# Patient Record
Sex: Male | Born: 1935 | Race: White | Hispanic: No | Smoking: Former smoker
Health system: Southern US, Community
[De-identification: ages and names within clinical notes are randomized; demographics above are authoritative.]

## PROBLEM LIST (undated history)

## (undated) DIAGNOSIS — N183 Chronic kidney disease, stage 3 unspecified: Secondary | ICD-10-CM

## (undated) DIAGNOSIS — I4891 Unspecified atrial fibrillation: Secondary | ICD-10-CM

## (undated) DIAGNOSIS — F0390 Unspecified dementia without behavioral disturbance: Secondary | ICD-10-CM

## (undated) DIAGNOSIS — M545 Low back pain, unspecified: Secondary | ICD-10-CM

## (undated) DIAGNOSIS — D649 Anemia, unspecified: Secondary | ICD-10-CM

## (undated) DIAGNOSIS — I509 Heart failure, unspecified: Secondary | ICD-10-CM

## (undated) DIAGNOSIS — I10 Essential (primary) hypertension: Secondary | ICD-10-CM

## (undated) DIAGNOSIS — R600 Localized edema: Secondary | ICD-10-CM

## (undated) DIAGNOSIS — C679 Malignant neoplasm of bladder, unspecified: Secondary | ICD-10-CM

## (undated) DIAGNOSIS — H3530 Unspecified macular degeneration: Secondary | ICD-10-CM

## (undated) DIAGNOSIS — I739 Peripheral vascular disease, unspecified: Secondary | ICD-10-CM

## (undated) DIAGNOSIS — G459 Transient cerebral ischemic attack, unspecified: Secondary | ICD-10-CM

## (undated) DIAGNOSIS — J189 Pneumonia, unspecified organism: Secondary | ICD-10-CM

## (undated) DIAGNOSIS — E785 Hyperlipidemia, unspecified: Secondary | ICD-10-CM

## (undated) DIAGNOSIS — R569 Unspecified convulsions: Secondary | ICD-10-CM

## (undated) DIAGNOSIS — E119 Type 2 diabetes mellitus without complications: Secondary | ICD-10-CM

## (undated) DIAGNOSIS — I2510 Atherosclerotic heart disease of native coronary artery without angina pectoris: Secondary | ICD-10-CM

## (undated) DIAGNOSIS — N390 Urinary tract infection, site not specified: Secondary | ICD-10-CM

## (undated) DIAGNOSIS — I6529 Occlusion and stenosis of unspecified carotid artery: Secondary | ICD-10-CM

## (undated) DIAGNOSIS — R4182 Altered mental status, unspecified: Secondary | ICD-10-CM

## (undated) DIAGNOSIS — M1990 Unspecified osteoarthritis, unspecified site: Secondary | ICD-10-CM

## (undated) DIAGNOSIS — I482 Chronic atrial fibrillation, unspecified: Secondary | ICD-10-CM

## (undated) HISTORY — DX: Heart failure, unspecified: I50.9

## (undated) HISTORY — DX: Transient cerebral ischemic attack, unspecified: G45.9

## (undated) HISTORY — PX: OTHER SURGICAL HISTORY: SHX169

## (undated) HISTORY — DX: Atherosclerotic heart disease of native coronary artery without angina pectoris: I25.10

## (undated) HISTORY — DX: Low back pain: M54.5

## (undated) HISTORY — PX: BLADDER TUMOR EXCISION: SHX238

## (undated) HISTORY — PX: TONSILLECTOMY: SUR1361

## (undated) HISTORY — PX: EYE SURGERY: SHX253

## (undated) HISTORY — DX: Hyperlipidemia, unspecified: E78.5

## (undated) HISTORY — DX: Unspecified macular degeneration: H35.30

## (undated) HISTORY — DX: Peripheral vascular disease, unspecified: I73.9

## (undated) HISTORY — DX: Localized edema: R60.0

## (undated) HISTORY — PX: ORBITAL FRACTURE SURGERY: SHX725

## (undated) HISTORY — DX: Anemia, unspecified: D64.9

## (undated) HISTORY — DX: Essential (primary) hypertension: I10

## (undated) HISTORY — DX: Unspecified atrial fibrillation: I48.91

## (undated) HISTORY — DX: Urinary tract infection, site not specified: N39.0

## (undated) HISTORY — DX: Occlusion and stenosis of unspecified carotid artery: I65.29

## (undated) HISTORY — DX: Chronic atrial fibrillation, unspecified: I48.20

## (undated) HISTORY — DX: Low back pain, unspecified: M54.50

---
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
## 1988-12-10 HISTORY — PX: I & D EXTREMITY: SHX5045

## 1997-07-21 ENCOUNTER — Other Ambulatory Visit: Admission: RE | Admit: 1997-07-21 | Discharge: 1997-07-21 | Payer: Self-pay | Admitting: Orthopedic Surgery

## 1997-08-28 ENCOUNTER — Encounter: Admission: RE | Admit: 1997-08-28 | Discharge: 1997-08-28 | Payer: Self-pay | Admitting: Infectious Diseases

## 1998-04-01 ENCOUNTER — Ambulatory Visit (HOSPITAL_BASED_OUTPATIENT_CLINIC_OR_DEPARTMENT_OTHER): Admission: RE | Admit: 1998-04-01 | Discharge: 1998-04-01 | Payer: Self-pay | Admitting: Urology

## 1999-04-12 DIAGNOSIS — C679 Malignant neoplasm of bladder, unspecified: Secondary | ICD-10-CM

## 1999-04-12 HISTORY — DX: Malignant neoplasm of bladder, unspecified: C67.9

## 2000-01-24 ENCOUNTER — Encounter: Payer: Self-pay | Admitting: Urology

## 2000-01-24 ENCOUNTER — Ambulatory Visit (HOSPITAL_COMMUNITY): Admission: RE | Admit: 2000-01-24 | Discharge: 2000-01-24 | Payer: Self-pay | Admitting: Urology

## 2000-01-24 ENCOUNTER — Encounter (INDEPENDENT_AMBULATORY_CARE_PROVIDER_SITE_OTHER): Payer: Self-pay

## 2000-05-02 ENCOUNTER — Encounter: Admission: RE | Admit: 2000-05-02 | Discharge: 2000-07-31 | Payer: Self-pay | Admitting: Internal Medicine

## 2001-09-27 ENCOUNTER — Ambulatory Visit (HOSPITAL_COMMUNITY): Admission: RE | Admit: 2001-09-27 | Discharge: 2001-09-27 | Payer: Self-pay | Admitting: Internal Medicine

## 2005-02-09 DIAGNOSIS — I2510 Atherosclerotic heart disease of native coronary artery without angina pectoris: Secondary | ICD-10-CM

## 2005-02-09 HISTORY — DX: Atherosclerotic heart disease of native coronary artery without angina pectoris: I25.10

## 2005-02-09 HISTORY — PX: CORONARY ANGIOPLASTY WITH STENT PLACEMENT: SHX49

## 2005-02-28 ENCOUNTER — Inpatient Hospital Stay (HOSPITAL_COMMUNITY): Admission: RE | Admit: 2005-02-28 | Discharge: 2005-03-02 | Payer: Self-pay | Admitting: Cardiology

## 2005-10-19 ENCOUNTER — Ambulatory Visit (HOSPITAL_COMMUNITY): Admission: RE | Admit: 2005-10-19 | Discharge: 2005-10-19 | Payer: Self-pay | Admitting: Urology

## 2005-10-19 ENCOUNTER — Encounter (INDEPENDENT_AMBULATORY_CARE_PROVIDER_SITE_OTHER): Payer: Self-pay | Admitting: Specialist

## 2005-10-25 ENCOUNTER — Encounter: Admission: RE | Admit: 2005-10-25 | Discharge: 2005-10-25 | Payer: Self-pay | Admitting: Internal Medicine

## 2009-02-06 ENCOUNTER — Ambulatory Visit: Payer: Self-pay | Admitting: Vascular Surgery

## 2010-04-17 ENCOUNTER — Emergency Department (HOSPITAL_COMMUNITY)
Admission: EM | Admit: 2010-04-17 | Discharge: 2010-04-17 | Payer: Self-pay | Source: Home / Self Care | Admitting: Emergency Medicine

## 2010-05-02 ENCOUNTER — Encounter: Payer: Self-pay | Admitting: Internal Medicine

## 2010-08-27 NOTE — Op Note (Signed)
[facility]  Patient:    [name], [name]                     MRN: [id number] Proc. Date: 01/24/00 Adm. Date:  [id number] Disc. 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  PROCEDURE PERFORMED:  Cystoscopy, left double-J stent placement, and transurethral resection bladder tumor.  SURGEON:  Barron Alvine, M.D.  ANESTHESIA:  General.  INDICATIONS:  Mr. Ridley is an 77 year old male.  He has had recurrent superficial and low-grade transitional cell carcinoma of the bladder.  A recent office cystoscopy as part of his routine follow-up, he was noted to have a small area of papillary transitional cell carcinoma that appeared well differentiated and surrounding the left ureteral orifice.  He presents now for management for this.  TECHNIQUE AND FINDINGS:  The patient was brought to the operating room where he had successful induction of general endotracheal anesthesia.  He was placed in lithotomy position and prepped and draped in the usual manner.  Cystoscopy revealed moderate trilobar hyperplasia.  Around the left ureteral orifice there was a carpeting of well-differentiated appearing papillary transitional cell carcinoma.  A 7 French 24 cm double-J stent was placed in the ureteral orifice to protect the orifice prior to parting on the resection.  This was done in a standard manner with fluoroscopy.  We then used a 27 [place] continuous flow resectoscope.  We were able to resect the tumor around the ureteral orifice.  We felt that the double-J stent should remain indwelling to make certain that there was no ureteral stricturing.  There was minimal bleeding.  Hemostasis was excellent.  We did not feel a  Foley catheter was necessary.  He was brought to the recovery room in stable condition. DD:  02/14/00 TD:  02/14/00 Job: [id number]

## 2010-08-27 NOTE — Op Note (Signed)
NAME:  PHONG, ISENBERG NO.:  [id number]   MEDICAL RECORD NO.:  [id number]          PATIENT TYPE:  AMB   LOCATION:  DAY                          FACILITY:  [facility]   PHYSICIAN:  Valetta Fuller, M.D.  DATE OF BIRTH:  [birth year]   DATE OF PROCEDURE:  10/19/2005  DATE OF DISCHARGE:                                 OPERATIVE REPORT   PREOPERATIVE DIAGNOSIS:  Recurrent transitional cell carcinoma of the  bladder.   POSTOPERATIVE DIAGNOSIS:  Recurrent transitional cell carcinoma of the  bladder.   PROCEDURES PERFORMED:  1.  Cystoscopy with bladder biopsy x3.  2.  Fulguration of remaining tumor.   SURGEON:  Valetta Fuller, M.D.   ANESTHESIA:  General.   INDICATIONS:  Mr. Franklin is an 77 year old male who has a previous history  of transitional cell carcinoma of the bladder.  He recently presented for  followup after not being seen in the office for about 2 to 2-1/2 years.  About 10 years ago he was diagnosed with his first transitional cell  carcinoma of the bladder.  His tumors had been noninvasive and low grade.  Approximately 2 years after his initial diagnosis he had a recurrence; and  then another one about 2 years after that.  All of his tumors have been low  grade and noninvasive.  The patient had negative followup then for several  years.  He came in recently for repeat followup and cystoscopy revealed  multiple areas of tumor involving primarily the left hemitrigone and bladder  neck region.  All these tumors appeared to be well-differentiated and  noninvasive.  We recommended repeat biopsy and fulguration.  The patient did  receive some preoperative clearance.  We elected to stop his Plavix, but  continue his aspirin.  He did receive preoperative clearance by his  cardiologist.   TECHNIQUE AND FINDINGS:  The patient was brought to the operating room where  he had successful induction of general anesthesia.  He was placed in the  lithotomy position and  prepped and draped in the usual manner.  The patient  does have significant phimosis, but we were able to insert the cystoscope  without difficulty.  He does have a little trilobar hyperplasia, but his  bladder neck is wide open.  The bladder was carefully inspected.  The  majority of the tumor was really involving the left hemi trigone, but was  away from the left ureteral orifice.  There was also some carpeting of the  tumor onto the left bladder neck region.   One area of tumor was fairly exophytic measuring just little a less than 1  centimeter in size.  That larger little tumor was cold-cup biopsied.  Additional areas of carpeting were also biopsied to be sure there was really  no carcinoma in situ.  Once the biopsies were complete, we used the Bovie  electrode to cauterize the areas of biopsy; and then to treat the overlying  little bit of carpeting of transitional cell carcinoma that was involving  the left hemitrigone and bladder neck region.  There were no other areas  involved.  While some of the areas of the tumor came close to the  ureteral orifice, they did not really involve that; and, therefore, stenting  was not required.  The patient appeared to tolerate the procedure well.  The  bladder was copiously irrigated at the end of the procedure; and the urine  was clear.  Foley catheter drainage was not felt to be necessary.  He was  brought to recovery room in stable condition.           ______________________________  Valetta Fuller, M.D.  Electronically Signed     DSG/MEDQ  D:  10/19/2005  T:  10/19/2005  Job:  [id number]

## 2010-08-27 NOTE — Discharge Summary (Signed)
NAME:  MODESTO, GANOE NO.:  [id number]   MEDICAL RECORD NO.:  [id number]          PATIENT TYPE:  INP   LOCATION:  6533                         FACILITY:  [facility]   PHYSICIAN:  Francisca December, M.D.  DATE OF BIRTH:  1917-11-25   DATE OF ADMISSION:  02/28/2005  DATE OF DISCHARGE:  03/02/2005                                 DISCHARGE SUMMARY   PRIMARY CARE PHYSICIAN:  Thora Lance, M.D.   CHIEF COMPLAINT AND REASON FOR ADMISSION:  Mr. Amory is an 77 year old  gentleman with known diabetes and hypertension who had previously been  evaluated by Dr. Amil Amen in June 2006 for complaints of episodic chest  pressure. Cardiolite study showed abnormalities.  At that time, cardiac  catheterization for diagnostic purposes was recommended by Dr. Amil Amen, but  Mr. Perreira was reluctant to proceed.  Since that time, the patient has  resumed having chest discomfort.  This started again about 2-1/2 weeks prior  to admission described as a dull ache, intermittent in frequency, rated  3/10. There is no radiation of this pain.  The pain lasts for about 15  minutes and is relieved by nitroglycerin.  The patient has believed that  this chest pressure is stress induced, no other associated symptoms.  The  patient did follow up with Dr. Amil Amen in the office on February 14, 2005,  and at this time he agrees to undergo diagnostic coronary angiography to  further clarify etiology of chest discomfort.   ADMISSION DIAGNOSES:  1.  Abnormal Cardiolite study with recurrent exertional chest discomfort.  2.  Hypertension.  3.  Diabetes.  4.  History of bladder tumor.  5.  History of abnormal Cardiolite study in June 2006 that demonstrated a      reversible apical lateral and apical defect, probable left anterior      descending obstruction with preserved left ventricular function,      ejection fraction 51%.  6.  Memory loss.  7.  Vitamin B12 deficiency.   HOSPITAL COURSE:  #1.  ABNORMAL  CARDIOLITE STUDY IN PATIENT WITH EXERTIONAL CHEST DISCOMFORT:  The patient was admitted via short stay for diagnostic coronary angiography,  and he underwent this on February 28, 2005.  He was found to have two-vessel  coronary artery disease involving the RCA and LAD.  On November 20, he  underwent percutaneous coronary intervention and Taxus stent implantation to  the RCA and tolerated the procedure well with plans to return on November 21  for percutaneous coronary intervention of the LAD.   On March 01, 2005, the patient's left groin was unremarkable.  Physical  exam was stable with moderately elevated blood pressure at 155/55, pulse  anywhere from 54 to 65.  Creatinine stable at 1.3.   He subsequently underwent percutaneous coronary intervention and stent  implantation to the LAD and diagonal on March 01, 2005.  Again, this was  a Taxus stent.  He was started on Aggrastat during the procedure and  continued postprocedure.  His right groin was unremarkable with a few  bruises, no hematoma.  Distal pulses were intact.  His  blood pressure was  still somewhat elevated at 152/50 with a heart rate of 68, so low-dose  metoprolol 25 mg twice daily has been initiated.   Postprocedure March 02, 2005, potassium 3.6, creatinine 1.2.  Lipid  status is unknown, and I will discuss with Dr. Amil Amen.  The patient will  probably have a fasting lipid panel drawn after discharge, and Dr. Amil Amen  will determine later if patient needs to initiate statin therapy in the  setting of 2-vessel coronary artery disease status post percutaneous  coronary intervention and stent implantation.   DISCHARGE DIAGNOSES:  1.  Exertional chest discomfort with abnormal Cardiolite study.  2.  Status post percutaneous coronary intervention to the right coronary      artery with Taxus stent implantation on February 28, 2005.  3.  Status post percutaneous coronary intervention with Taxus stent      implantation  to the left anterior descending and diagonal on March 01, 2005.  4.  Preserved left ventricular systolic function with ejection fraction 65%      per catheterization.  5.  Hypertension, currently uncontrolled.  6.  Diabetes mellitus.  7.  Memory loss.  8.  B12 deficiency.   DISCHARGE MEDICATIONS:  1.  Amaryl, dosage unclear, it is either 2 or 4 mg daily.  Reviewed the      medication reconciliation sheet, and there were 2 different dosages in      Amaryl as patient was taking it at home.  2.  Continue vitamin B12, multivitamins, vitamin E daily.  3.  Continue Maxzide 37.5/25 mg daily.  4.  Aspirin 325 mg daily.  5.  Nitroglycerin 0.4 mg as needed for chest pain.  6.  Plavix 75 mg daily.  This is new.  7.  Metoprolol 25 mg twice daily.  This is new.   DIET:  Heart healthy.   ACTIVITY:  Increase activity slowly.  May shower or bathe.  No driving for  48 hours.  No lifting for 48 hours over 10 pounds.   WOUND CARE:  Call for any increased bruising or swelling at groin site.   FAMILY HISTORY:  Dr. Amil Amen on December 7 at 9 a.m. with additional  recommends by Dr. Amil Amen.      Allison L. Rolene Course      Francisca December, M.D.  Electronically Signed   ALE/MEDQ  D:  03/02/2005  T:  03/02/2005  Job:  [id number]   cc:   Thora Lance, M.D.  Fax: [phone]

## 2010-12-30 ENCOUNTER — Encounter: Payer: Self-pay | Admitting: Vascular Surgery

## 2010-12-30 ENCOUNTER — Other Ambulatory Visit: Payer: Self-pay | Admitting: Internal Medicine

## 2010-12-30 ENCOUNTER — Ambulatory Visit
Admission: RE | Admit: 2010-12-30 | Discharge: 2010-12-30 | Disposition: A | Discharge status: Home / Self Care | Payer: Medicare HMO | Source: Ambulatory Visit | Attending: Internal Medicine | Admitting: Internal Medicine

## 2010-12-30 DIAGNOSIS — G459 Transient cerebral ischemic attack, unspecified: Secondary | ICD-10-CM

## 2010-12-31 ENCOUNTER — Ambulatory Visit (INDEPENDENT_AMBULATORY_CARE_PROVIDER_SITE_OTHER): Payer: Medicare HMO | Admitting: Vascular Surgery

## 2010-12-31 ENCOUNTER — Encounter: Payer: Self-pay | Admitting: Vascular Surgery

## 2010-12-31 ENCOUNTER — Other Ambulatory Visit: Payer: Self-pay

## 2010-12-31 VITALS — BP 153/80 | HR 54 | Resp 20 | Ht 69.0 in | Wt 227.0 lb

## 2010-12-31 DIAGNOSIS — I6529 Occlusion and stenosis of unspecified carotid artery: Secondary | ICD-10-CM
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
## 2011-01-13 ENCOUNTER — Inpatient Hospital Stay (HOSPITAL_COMMUNITY)
Admission: EM | Admit: 2011-01-13 | Discharge: 2011-01-20 | DRG: 038 | Disposition: A | Discharge status: Home / Self Care | Payer: Medicare HMO | Source: Ambulatory Visit | Attending: Internal Medicine | Admitting: Internal Medicine

## 2011-01-13 ENCOUNTER — Emergency Department (HOSPITAL_COMMUNITY): Payer: Medicare HMO

## 2011-01-13 DIAGNOSIS — R339 Retention of urine, unspecified: Secondary | ICD-10-CM | POA: Diagnosis not present

## 2011-01-13 DIAGNOSIS — Z7901 Long term (current) use of anticoagulants: Secondary | ICD-10-CM

## 2011-01-13 DIAGNOSIS — D649 Anemia, unspecified: Secondary | ICD-10-CM | POA: Diagnosis present

## 2011-01-13 DIAGNOSIS — E119 Type 2 diabetes mellitus without complications: Secondary | ICD-10-CM | POA: Diagnosis present

## 2011-01-13 DIAGNOSIS — E538 Deficiency of other specified B group vitamins: Secondary | ICD-10-CM | POA: Diagnosis present

## 2011-01-13 DIAGNOSIS — Z8673 Personal history of transient ischemic attack (TIA), and cerebral infarction without residual deficits: Secondary | ICD-10-CM

## 2011-01-13 DIAGNOSIS — Z880 Allergy status to penicillin: Secondary | ICD-10-CM

## 2011-01-13 DIAGNOSIS — Z7982 Long term (current) use of aspirin: Secondary | ICD-10-CM

## 2011-01-13 DIAGNOSIS — I10 Essential (primary) hypertension: Secondary | ICD-10-CM | POA: Diagnosis present

## 2011-01-13 DIAGNOSIS — N471 Phimosis: Secondary | ICD-10-CM | POA: Diagnosis present

## 2011-01-13 DIAGNOSIS — Z9861 Coronary angioplasty status: Secondary | ICD-10-CM

## 2011-01-13 DIAGNOSIS — Z8551 Personal history of malignant neoplasm of bladder: Secondary | ICD-10-CM

## 2011-01-13 DIAGNOSIS — Z7902 Long term (current) use of antithrombotics/antiplatelets: Secondary | ICD-10-CM

## 2011-01-13 DIAGNOSIS — N478 Other disorders of prepuce: Secondary | ICD-10-CM | POA: Diagnosis present

## 2011-01-13 DIAGNOSIS — I63239 Cerebral infarction due to unspecified occlusion or stenosis of unspecified carotid arteries: Principal | ICD-10-CM | POA: Diagnosis present

## 2011-01-13 DIAGNOSIS — I4892 Unspecified atrial flutter: Secondary | ICD-10-CM | POA: Diagnosis not present

## 2011-01-13 DIAGNOSIS — I2510 Atherosclerotic heart disease of native coronary artery without angina pectoris: Secondary | ICD-10-CM | POA: Diagnosis present

## 2011-01-13 DIAGNOSIS — I4891 Unspecified atrial fibrillation: Secondary | ICD-10-CM | POA: Diagnosis present

## 2011-01-13 DIAGNOSIS — D696 Thrombocytopenia, unspecified: Secondary | ICD-10-CM | POA: Diagnosis not present

## 2011-01-13 DIAGNOSIS — Z79899 Other long term (current) drug therapy: Secondary | ICD-10-CM

## 2011-01-13 DIAGNOSIS — E785 Hyperlipidemia, unspecified: Secondary | ICD-10-CM | POA: Diagnosis present

## 2011-01-13 DIAGNOSIS — N179 Acute kidney failure, unspecified: Secondary | ICD-10-CM | POA: Diagnosis present

## 2011-01-13 LAB — BASIC METABOLIC PANEL
BUN: 35 mg/dL — ABNORMAL HIGH (ref 6–23)
CO2: 24 mEq/L (ref 19–32)
Calcium: 9.4 mg/dL (ref 8.4–10.5)
Chloride: 109 mEq/L (ref 96–112)
Creatinine, Ser: 2.03 mg/dL — ABNORMAL HIGH (ref 0.50–1.35)
GFR calc Af Amer: 31 mL/min — ABNORMAL LOW (ref >90)
GFR calc non Af Amer: 27 mL/min — ABNORMAL LOW (ref >90)
Glucose, Bld: 170 mg/dL — ABNORMAL HIGH (ref 70–99)
Potassium: 4.5 mEq/L (ref 3.5–5.1)
Sodium: 139 mEq/L (ref 135–145)

## 2011-01-13 LAB — DIFFERENTIAL
Basophils Absolute: 0.1 10*3/uL (ref 0.0–0.1)
Basophils Relative: 1 % (ref 0–1)
Eosinophils Absolute: 0.1 10*3/uL (ref 0.0–0.7)
Eosinophils Relative: 1 % (ref 0–5)
Lymphocytes Relative: 16 % (ref 12–46)
Lymphs Abs: 1.1 10*3/uL (ref 0.7–4.0)
Monocytes Absolute: 0.3 10*3/uL (ref 0.1–1.0)
Monocytes Relative: 5 % (ref 3–12)
Neutro Abs: 5.2 10*3/uL (ref 1.7–7.7)
Neutrophils Relative %: 77 % (ref 43–77)

## 2011-01-13 LAB — CBC
HCT: 30.5 % — ABNORMAL LOW (ref 39.0–52.0)
Hemoglobin: 10.2 g/dL — ABNORMAL LOW (ref 13.0–17.0)
MCH: 31.5 pg (ref 26.0–34.0)
MCHC: 33.4 g/dL (ref 30.0–36.0)
MCV: 94.1 fL (ref 78.0–100.0)
Platelets: 174 10*3/uL (ref 150–400)
RBC: 3.24 MIL/uL — ABNORMAL LOW (ref 4.22–5.81)
RDW: 16.3 % — ABNORMAL HIGH (ref 11.5–15.5)
WBC: 6.8 10*3/uL (ref 4.0–10.5)

## 2011-01-13 LAB — PROTIME-INR
INR: 1.03 (ref 0.00–1.49)
Prothrombin Time: 13.7 seconds (ref 11.6–15.2)

## 2011-01-14 ENCOUNTER — Inpatient Hospital Stay (HOSPITAL_COMMUNITY): Payer: Medicare HMO

## 2011-01-14 DIAGNOSIS — I6529 Occlusion and stenosis of unspecified carotid artery: Secondary | ICD-10-CM

## 2011-01-14 LAB — GLUCOSE, CAPILLARY
Glucose-Capillary: 136 mg/dL — ABNORMAL HIGH (ref 70–99)
Glucose-Capillary: 128 mg/dL — ABNORMAL HIGH (ref 70–99)

## 2011-01-14 LAB — COMPREHENSIVE METABOLIC PANEL
ALT: 13 U/L (ref 0–53)
AST: 22 U/L (ref 0–37)
Albumin: 2.8 g/dL — ABNORMAL LOW (ref 3.5–5.2)
Calcium: 8.8 mg/dL (ref 8.4–10.5)
Sodium: 142 mEq/L (ref 135–145)
Total Protein: 6.3 g/dL (ref 6.0–8.3)

## 2011-01-14 LAB — CARDIAC PANEL(CRET KIN+CKTOT+MB+TROPI)
CK, MB: 2.2 ng/mL (ref 0.3–4.0)
Relative Index: INVALID (ref 0.0–2.5)
Total CK: 44 U/L (ref 7–232)

## 2011-01-15 LAB — BASIC METABOLIC PANEL
GFR calc Af Amer: 50 mL/min — ABNORMAL LOW (ref >90)
GFR calc non Af Amer: 43 mL/min — ABNORMAL LOW (ref >90)
Glucose, Bld: 105 mg/dL — ABNORMAL HIGH (ref 70–99)
Potassium: 4.3 mEq/L (ref 3.5–5.1)
Sodium: 138 mEq/L (ref 135–145)

## 2011-01-15 LAB — CBC
Hemoglobin: 9.2 g/dL — ABNORMAL LOW (ref 13.0–17.0)
MCHC: 33.5 g/dL (ref 30.0–36.0)
RDW: 16.5 % — ABNORMAL HIGH (ref 11.5–15.5)

## 2011-01-15 LAB — GLUCOSE, CAPILLARY
Glucose-Capillary: 152 mg/dL — ABNORMAL HIGH (ref 70–99)
Glucose-Capillary: 127 mg/dL — ABNORMAL HIGH (ref 70–99)

## 2011-01-15 NOTE — Consult Note (Signed)
[name], CASHER NO.:  [id number]  MEDICAL RECORD NO.:  [id number]  LOCATION:  3705                         FACILITY:  [facility]  PHYSICIAN:  Di Kindle. Edilia Bo, M.D.DATE OF BIRTH:  [birth year]  DATE OF CONSULTATION:  01/14/2011 DATE OF DISCHARGE:                                CONSULTATION   HISTORY:  This is a 77 year old gentleman who had been evaluated by Dr. Imogene Burn on December 31, 2010.  He had had an episode of speech difficulty prior to this visit and then later some left facial droop and uncoordination of the left leg.  He was found to have a tight right carotid stenosis and right carotid endarterectomy was recommended.  The patient did have a history of atrial fibrillation and was sent for cardiac evaluation preoperatively.  According to family, he had not yet been scheduled for surgery.  The remainder of Dr. Nicky Pugh note is documented on December 31, 2010.  Since he was seen in our office on December 31, 2010, he has had another episode yesterday where he had slurred speech and also left arm weakness.  This lasted approximately 10-15 minutes.  He has had no history of leg weakness associated with the most recent episode.  He has had no paresthesias.  His symptoms have completely resolved.  He had previous carotid duplex scan which showed a greater than 80% right carotid stenosis with a less than 50% left carotid stenosis. During this admission, he has had a CT of the head on January 14, 2011, which I have reviewed and showed old infarctions, but no acute intracranial process.  MR of the brain shows an acute subcentimeter multifocal areas of acute infarct affecting the right hemisphere.  This involves the motor strip on the right.  MRA shows evidence of significant right internal carotid artery stenosis likely related to proximal right carotid stenosis.  Vascular Surgery was consulted given his recurrent symptoms consistent with right hemispheric  TIAs.  The patient states that they did see Dr. Mayford Knife at [facility] and had a stress test which according to the family was negative.  The patient is on aspirin and Plavix.  PAST MEDICAL HISTORY:  Significant for diabetes; hypertension; atrial fibrillation, but he is currently not in atrial fibrillation and has not been on Coumadin.  He denies any history of myocardial infarction or history of congestive heart failure.  In addition, the patient has a history of bladder cancer.  SOCIAL HISTORY:  He is widowed.  He quit tobacco in 1941.  FAMILY HISTORY:  His brother had a stroke and his brother also had heart disease.  He is unaware of any history of other premature cardiovascular disease.  REVIEW OF SYSTEMS:  GENERAL:  He has had no weight loss, weight gain, or problems with appetite.  CARDIOVASCULAR:  He has had no chest pain, chest pressure, palpitations, or arrhythmias.  PULMONARY:  He has had no productive cough, bronchitis, asthma, or wheezing.  ENT:  No change in hearing, nosebleeds, or sore throat.  VASCULAR:  No claudication or rest pain.  HEMATOLOGIC:  No bleeding problems or clotting disorders. PSYCHIATRIC:  No depression or anxiety.  GI:  No black stools, blood in  the stool, peptic ulcer disease, or reflux.  GU:  No dysuria or frequency.  SKIN:  There has been no ulcers or rashes.  PHYSICAL EXAMINATION:  GENERAL:  This is a pleasant 77 year old gentleman who appears his stated age. VITAL SIGNS:  His temperature is 98.6, blood pressure is 153/74, heart rate is 76, and saturation 96% on room air. HEENT:  Unremarkable. LUNGS:  Clear bilaterally to auscultation without rales, rhonchi, or wheezing. CARDIOVASCULAR:  He has a regular rate and rhythm.  He has palpable femoral pulses.  I cannot palpate popliteal pedal pulses. NEUROLOGIC:  He has no focal weakness or paresthesias in the upper extremities or lower extremities. ABDOMEN:  Soft and nontender with normal  pitched bowel sounds.  No aneurysm is appreciated. HEENT:  Unremarkable. PSYCHIATRIC:  Mood and affect are normal. LYMPHATIC:  No significant cervical, axillary, or inguinal lymphadenopathy.  I have reviewed his carotid duplex scan that was done in our office.  In addition, I have reviewed his MRI, CT, and MRA as described above.  IMPRESSION:  This patient presents with a symptomatic right carotid stenosis with 3 episodes, likely related to the right carotid stenosis. The first episode involved some slurred speech.  Subsequently, he had an episode of left facial droop and left leg weakness.  The most recent episode involves slurred speech and left arm weakness.  Given the critical right carotid stenosis, I would agree the right carotid endarterectomy is recommended in order to lower his risk of stroke.  He certainly is at slightly increased risk because of his age.  I will attempt to consult Dr. Mayford Knife or whoever is on-call for that group this week to determine if he is safe from a medical standpoint to proceed with carotid endarterectomy and we will notify Dr. Imogene Burn on Monday and schedule his surgery early next week assuming that he is medically stable.  If he had recurrent episodes during the weekend, we could potentially have to proceed with surgery urgently, although I think it would be best to be sure that his medical workup is complete prior to proceeding with surgery.     Di Kindle. Edilia Bo, M.D.     CSD/MEDQ  D:  01/14/2011  T:  01/15/2011  Job:  [id number]  cc:   Armanda Magic, M.D. Marlan Palau, M.D.  Electronically Signed by Waverly Ferrari M.D. on 01/15/2011 04:40:25 PM

## 2011-01-16 LAB — FERRITIN: Ferritin: 47 ng/mL (ref 22–322)

## 2011-01-16 LAB — CBC
MCH: 31.2 pg (ref 26.0–34.0)
MCHC: 33.6 g/dL (ref 30.0–36.0)
Platelets: 138 10*3/uL — ABNORMAL LOW (ref 150–400)
RDW: 16.2 % — ABNORMAL HIGH (ref 11.5–15.5)

## 2011-01-16 LAB — SURGICAL PCR SCREEN: Staphylococcus aureus: POSITIVE — AB

## 2011-01-16 LAB — BASIC METABOLIC PANEL
Calcium: 8.8 mg/dL (ref 8.4–10.5)
GFR calc non Af Amer: 45 mL/min — ABNORMAL LOW (ref >90)
Sodium: 138 mEq/L (ref 135–145)

## 2011-01-16 LAB — TYPE AND SCREEN: Antibody Screen: NEGATIVE

## 2011-01-16 LAB — GLUCOSE, CAPILLARY
Glucose-Capillary: 88 mg/dL (ref 70–99)
Glucose-Capillary: 165 mg/dL — ABNORMAL HIGH (ref 70–99)

## 2011-01-16 LAB — FOLATE: Folate: 15.1 ng/mL

## 2011-01-16 LAB — PROTIME-INR: Prothrombin Time: 14.3 seconds (ref 11.6–15.2)

## 2011-01-17 ENCOUNTER — Observation Stay (HOSPITAL_COMMUNITY): Payer: Medicare HMO

## 2011-01-17 ENCOUNTER — Other Ambulatory Visit: Payer: Self-pay | Admitting: Vascular Surgery

## 2011-01-17 HISTORY — PX: CAROTID ENDARTERECTOMY: SUR193

## 2011-01-17 LAB — BASIC METABOLIC PANEL
BUN: 24 mg/dL — ABNORMAL HIGH (ref 6–23)
CO2: 21 mEq/L (ref 19–32)
Calcium: 8.7 mg/dL (ref 8.4–10.5)
Creatinine, Ser: 1.35 mg/dL (ref 0.50–1.35)

## 2011-01-17 LAB — CBC
HCT: 27.4 % — ABNORMAL LOW (ref 39.0–52.0)
MCV: 93.8 fL (ref 78.0–100.0)
Platelets: 147 10*3/uL — ABNORMAL LOW (ref 150–400)
RBC: 2.92 MIL/uL — ABNORMAL LOW (ref 4.22–5.81)
WBC: 5.7 10*3/uL (ref 4.0–10.5)

## 2011-01-17 LAB — GLUCOSE, CAPILLARY
Glucose-Capillary: 194 mg/dL — ABNORMAL HIGH (ref 70–99)
Glucose-Capillary: 95 mg/dL (ref 70–99)
Glucose-Capillary: 115 mg/dL — ABNORMAL HIGH (ref 70–99)

## 2011-01-17 LAB — ABO/RH: ABO/RH(D): A POS

## 2011-01-18 LAB — GLUCOSE, CAPILLARY: Glucose-Capillary: 166 mg/dL — ABNORMAL HIGH (ref 70–99)

## 2011-01-18 LAB — BASIC METABOLIC PANEL
BUN: 25 mg/dL — ABNORMAL HIGH (ref 6–23)
Calcium: 8.2 mg/dL — ABNORMAL LOW (ref 8.4–10.5)
Creatinine, Ser: 1.34 mg/dL (ref 0.50–1.35)
GFR calc Af Amer: 52 mL/min — ABNORMAL LOW (ref >90)
GFR calc non Af Amer: 45 mL/min — ABNORMAL LOW (ref >90)
Glucose, Bld: 113 mg/dL — ABNORMAL HIGH (ref 70–99)
Potassium: 4.3 mEq/L (ref 3.5–5.1)

## 2011-01-18 LAB — CBC
HCT: 23.4 % — ABNORMAL LOW (ref 39.0–52.0)
Hemoglobin: 7.7 g/dL — ABNORMAL LOW (ref 13.0–17.0)
MCH: 31.3 pg (ref 26.0–34.0)
MCHC: 32.9 g/dL (ref 30.0–36.0)
RDW: 16.3 % — ABNORMAL HIGH (ref 11.5–15.5)

## 2011-01-18 NOTE — Op Note (Signed)
  DELETED AND MERGED WITH MAIN DICTATION Electronically Signed by Leonides Sake MD on 01/18/2011 10:18:37 AM

## 2011-01-18 NOTE — Consult Note (Signed)
NAME:  Brent, Luna NO.:  [id number]  MEDICAL RECORD NO.:  [id number]  LOCATION:  3705                         FACILITY:  [facility]  PHYSICIAN:  Marlan Palau, M.D.  DATE OF BIRTH:  23-Dec-1917  DATE OF CONSULTATION:  01/14/2011 DATE OF DISCHARGE:                                CONSULTATION   HISTORY OF PRESENT ILLNESS:  [name] is a 77 year old right- handed white male born 12-May-1917 with a history of possible atrial fibrillation, coronary artery disease, diabetes and hypertension.  The patient comes in with a history of 3 separate events over the last 4 weeks.  The first event was 4 weeks ago associated with decreased responsiveness, left arm weakness that lasted less than a minute.  The patient had a second event 1 week later also associated with left arm weakness and decreased responsiveness lasting less than 2 minutes.  The patient had a more severe episode on the day of admission with left- sided weakness, decreased verbal output, some possible twitching or jerking.  The patient was brought in for evaluation.  MRI scan of the brain has shown evidence of a small right base ganglia infarct and MRA of the head shows decreased flow involving the right internal carotid artery.  A carotid Doppler study was done couple weeks prior to admission, suggesting 70-90% stenosis in the right internal carotid artery.  The patient has had a repeat study during this hospitalization, but the results are not available to me at this time.  The patient has also undergone a 2-D echocardiogram.  Results are pending.  The patient is on aspirin and Plavix and Neurology was asked to see the patient for further evaluation.  NIH stroke scale score is 0 at this time.  The patient is not a t-PA candidate secondary to minimal deficit.  PAST MEDICAL HISTORY:  Significant for; 1. 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  NIH stroke scale  score is actually 2, as the patient did     not know his age and did not know the month. 2. Coronary artery disease, status post stent. 3. Transitional cell carcinoma the bladder, status post resection. 4. Atrial fibrillation. 5. Diabetes. 6. Hypertension. 7. Mild organic brain syndrome. 8. B12 deficiency. 9. Macular degeneration, decreased visual acuity.  ALLERGIES:  The patient has allergy to PENICILLIN, the reaction is unknown.  SOCIAL HISTORY:  Does not smoke or drink.  This patient is a widower. [place].  The patient has 1 son, 2 daughters.  FAMILY HISTORY:  Mother died at age 20, etiology unknown.  Father died at age 103 with heart disease.  The patient has 10 siblings, most of whom have passed away, 3 brothers have died one with brain tumor, one with a stroke and myocardial infarction, one had a hemorrhage.  REVIEW OF SYSTEMS:  Notable for no recent fevers, chills.  The patient denies headache, vision changes, but has had progressive decline in vision.  Denies shortness of breath, chest pains, abdominal pain, nausea, vomiting, troubles controlling the bowels or bladder.  The patient denies any loss of consciousness  or dizziness.  PHYSICAL EXAMINATION:  VITAL SIGNS:  Blood pressure is 153/74, heart rate 76, respiratory rate 18, temperature afebrile. GENERAL:  The patient is a minimally obese white male who is alert and cooperative at the time of examination. HEENT:  Head is atraumatic.  Eyes; pupils are round and reactive to light. NECK:  Supple.  No carotid bruits noted. RESPIRATORY:  Clear. CARDIOVASCULAR:  Regular rate and rhythm with no obvious murmurs or rubs noted. EXTREMITIES:  Without significant edema. NEUROLOGIC:  Cranial nerves as above.  Facial symmetry is present.  The patient has good sensation of the face to pinprick, soft touch bilaterally.  He has good strength of facial muscles, muscle of the head turn  and shoulder shrug bilaterally.  Speech is well enunciated, not aphasic.  Good strength is noted on all fours.  The patient has good pinprick, soft touch, vibratory sensation throughout.  The patient has good finger-nose-finger, heel-to-shin.  Gait was not tested.  No drift is seen in the arms or legs.  Deep tendon reflexes are depressed, but symmetric.  Toes are neutral bilaterally.  No evidence of extinction was noted.  Double simultaneous stimulation.  LABORATORY DATA:  Laboratory values notable for a sodium 142, potassium 3.9, chloride of 110, CO2 of 23, glucose of 130, BUN of 33, creatinine of 1.85, total bili of 0.2, alk phosphatase 78, SGOT of 22, SGPT of 13, total protein 6.3, albumin of 2.8, calcium 8.8, hemoglobin A1c of 7.  CK of 44, troponin-I less than 0.3.  White count 6.8, hemoglobin 10.2, hematocrit of 30.5, MCV of 94.1, platelets of 174.  INR 1.03.  MRI of the head is as above.  IMPRESSION: 1. Onset of right brain stroke. 2. High-grade right internal carotid artery stenosis. 3. Atrial fibrillation. 4. Diabetes. 5. Hypertension.  PLAN:  This patient has sustained a small right brain stroke.  The patient has had 3 separate events referable to the right brain and to the right internal carotid artery.  I do not believe that the atrial fibrillation is an etiology in the stroke events.  The patient's EKG studies on this admission did not show atrial fibrillation.  The patient has been seen previously by Dr. Imogene Burn and I have contacted Dr. Durwin Nora to come by and comment on therapeutic options for this patient.  If possible, carotid stenting procedure may be a lower risk option for this patient given his age, but look for guidance from Dr. Durwin Nora concerning this.  The patient will remain on aspirin and Plavix at this point.  We will follow patient's clinical course while in-house.  Carotid Doppler study done during this hospitalization and a 2-D echocardiogram are  pending.     Marlan Palau, M.D.     CKW/MEDQ  D:  01/14/2011  T:  01/14/2011  Job:  [id number]  cc:   [name], M.D. [facility]  Electronically Signed by Thana Farr M.D. on 01/18/2011 06:25:09 PM

## 2011-01-19 LAB — CBC
Hemoglobin: 9.3 g/dL — ABNORMAL LOW (ref 13.0–17.0)
MCH: 31.5 pg (ref 26.0–34.0)
MCHC: 33.3 g/dL (ref 30.0–36.0)
Platelets: 137 10*3/uL — ABNORMAL LOW (ref 150–400)
RDW: 16.1 % — ABNORMAL HIGH (ref 11.5–15.5)

## 2011-01-19 LAB — BASIC METABOLIC PANEL
BUN: 23 mg/dL (ref 6–23)
Calcium: 8.7 mg/dL (ref 8.4–10.5)
Chloride: 107 mEq/L (ref 96–112)
Creatinine, Ser: 1.23 mg/dL (ref 0.50–1.35)
GFR calc Af Amer: 57 mL/min — ABNORMAL LOW (ref >90)

## 2011-01-19 LAB — GLUCOSE, CAPILLARY
Glucose-Capillary: 134 mg/dL — ABNORMAL HIGH (ref 70–99)
Glucose-Capillary: 118 mg/dL — ABNORMAL HIGH (ref 70–99)
Glucose-Capillary: 181 mg/dL — ABNORMAL HIGH (ref 70–99)

## 2011-01-19 LAB — PROTIME-INR
INR: 1.13 (ref 0.00–1.49)
Prothrombin Time: 14.7 seconds (ref 11.6–15.2)

## 2011-01-19 LAB — OCCULT BLOOD X 1 CARD TO LAB, STOOL: Fecal Occult Bld: NEGATIVE

## 2011-01-20 LAB — BASIC METABOLIC PANEL
CO2: 24 mEq/L (ref 19–32)
Chloride: 106 mEq/L (ref 96–112)
Creatinine, Ser: 1.36 mg/dL — ABNORMAL HIGH (ref 0.50–1.35)
Glucose, Bld: 104 mg/dL — ABNORMAL HIGH (ref 70–99)
Sodium: 138 mEq/L (ref 135–145)

## 2011-01-20 LAB — CBC
HCT: 26.9 % — ABNORMAL LOW (ref 39.0–52.0)
Hemoglobin: 8.9 g/dL — ABNORMAL LOW (ref 13.0–17.0)
MCHC: 33.1 g/dL (ref 30.0–36.0)
MCV: 92.8 fL (ref 78.0–100.0)
RDW: 15.7 % — ABNORMAL HIGH (ref 11.5–15.5)

## 2011-01-20 LAB — PROTIME-INR: INR: 1.26 (ref 0.00–1.49)

## 2011-01-20 LAB — GLUCOSE, CAPILLARY
Glucose-Capillary: 97 mg/dL (ref 70–99)
Glucose-Capillary: 144 mg/dL — ABNORMAL HIGH (ref 70–99)

## 2011-01-20 NOTE — Consult Note (Signed)
NAMEQADIR, FOLKS NO.:  [id number]  MEDICAL RECORD NO.:  [id number]  LOCATION:  3011                         FACILITY:  [facility]  PHYSICIAN:  Jake Bathe, MD      DATE OF BIRTH:  1918-03-01  DATE OF CONSULTATION:  01/19/2011 DATE OF DISCHARGE:                                CONSULTATION   REASON FOR CONSULTATION:  Question atrial fibrillation and question starting of Coumadin.  Mr. [name] is a 77 year old male with recurrent TIA/CVA, who just underwent a vascular surgery carotid endarterectomy, patch angioplasty by Dr. Imogene Burn of Vascular Surgery.  Neurology has been following him and has questioned whether or not he should be on Coumadin based upon his prior past medical history of atrial fibrillation.  I have carefully looked through his chart and prior EKGs and an EKG from July 08, 2010 in the office setting does look like an underlying atrial flutter rhythm.  I have carefully marked this out with calipers and there does appear to be a variable conduction atrial flutter underlying/atrial fibrillation.  Many of his telemetry monitoring during this hospitalization as well as other EKGs do show what looks like sinus rhythm, first-degree AV block with PACs.  He demonstrated this back in 2006 as well.  I looked at his stress test, EKG, and on one tracing, there does look once again to be that perhaps underlying atrial flutter rhythm.  I will admit this is very challenging to see.  In his chart, he is also determined to be a high fall risk and therefore Coumadin was not initiated originally when the diagnosis of atrial fibrillation was made back by Dr. Amil Amen.  I spoke to his daughter about these falls and she does not feel at this time that he is at fall risk and he has not suffered any severe falls.  I agree with Neurology that with his atrial fibrillation and his recurrent strokes that he is at increased risk for future stroke and certainly deserves to be  on Coumadin therapy.  Once he is therapeutic on Coumadin, I would advocate discontinuation of Plavix.  One could consider continuation of aspirin with Coumadin given his concomitant CAD diagnosis. Another factor to take into account is his dropping platelet count, which may be post from his operation and reactive.  Certainly continue to monitor.  He is also anemic currently and this will need to be monitored as well.  We will be happy to assist with Coumadin checks in our [facility].  PAST MEDICAL HISTORY: 1. Drug-eluting stent to RCA, LAD in 2006. 2. Diabetes. 3. Hypertension. 4. Macular degeneration. 5. Bladder tumor in 2001. 6. Prior history of atrial fibrillation.  SOCIAL HISTORY:  Former smoker, quit in 1991.  FAMILY HISTORY:  Currently noncontributory.  No alcohol use.  REVIEW OF SYSTEMS:  See H and P, which was reviewed.  No bleeding episodes.  No recent falls.  Unless specified above, all other 12 review of systems is negative.  MEDICATIONS:  Reviewed as an outpatient.  He is taking: 1. Aspirin 325. 2. Cilostazol 100. 3. Niacin 500. 4. Lisinopril and hydrochlorothiazide 20/25. 5. Amlodipine 5 mg. 6. Clopidogrel 75 mg. 7. Toprol 25  mg an hour. 8. Vitamin E. 9. Glipizide 10 mg a day.  ALLERGIES:  PENICILLIN.  PHYSICAL EXAMINATION:  VITAL SIGNS:  Temperature 99.5, blood pressure 151/56, pulse 61, respirations 14. GENERAL:  He is alert and oriented x3, sitting up comfortably in a wheelchair, no acute distress. EYES:  Pale conjunctivae.  EOMI.  No scleral icterus. NECK:  Postsurgical wound noted.  No JVD. CARDIOVASCULAR:  Regular rate and rhythm with frequent ectopy, 2/6 systolic murmur heard at the right upper sternal border.  Normal PMI. LUNGS:  Clear to auscultation bilaterally.  Normal respiratory effort. ABDOMEN:  Soft, nontender.  Normoactive bowel sounds.  No rebound.  No guarding.  No bruits.  EXTREMITIES:  No clubbing, cyanosis, or  edema. Normal distal pulses. GU:  Deferred. RECTAL:  Deferred. NEUROLOGIC:  Cranial nerves do appear to be intact from II through XII. HEENT:  Normocephalic and atraumatic.  Moist mucous membranes.  DATA:  EKGs reviewed.  Prior stress test reviewed shows low risk, no ischemia.  Medical records reviewed.  Lab work reviewed.  Creatinine is 1.3.  Hemoglobin A1c is 7.  Hemoglobin 7.7, hematocrit 23.4, and platelet count is 122 down from 180.  ASSESSMENT AND PLAN:  A 77 year old male with paroxysmal atrial fibrillation, recent transient ischemic attack/stroke with diabetes, hypertension.  No prior history of congestive heart failure, status post carotid endarterectomy.  Atrial fibrillation:  As in my discourse above, I do agree that the diagnosis is actually quite challenging in him and some EKGs are clearly sinus rhythm with PACs while another EKG most notably the one in March does appear to have an underlying atrial flutter rhythm.  Given his significant increased risk of stroke, if he does have atrial flutter given his multiple comorbidities and his CHADS-VASc score of at least 6 maybe 7 including his diabetes, I would certainly advocate Coumadin use. We will go ahead to initiate this per pharmacy and once therapeutic, I would advocate coming off the Plavix.  Consideration of aspirin low-dose with Coumadin would be reasonable given his underlying coronary artery disease.  Of course, watch for any signs of further bleeding, especially with his anemia and decreasing platelet count.  If he does show any signs of bleeding, Coumadin would have to be withheld obviously.  I have discussed this fully with the [facility] and with his daughter.     Jake Bathe, MD     MCS/MEDQ  D:  01/18/2011  T:  01/19/2011  Job:  [id number]  Electronically Signed by Donato Schultz MD on 01/20/2011 06:20:28 AM

## 2011-01-21 NOTE — Consult Note (Signed)
  NAMEAZAREL, BANNER NO.:  [id number]  MEDICAL RECORD NO.:  [id number]  LOCATION:  3307                         FACILITY:  [facility]  PHYSICIAN:  Excell Seltzer. Annabell Howells, M.D.    DATE OF BIRTH:  [birth year]  DATE OF CONSULTATION: DATE OF DISCHARGE:                                CONSULTATION   CHIEF COMPLAINT:  "I cannot pee."  HISTORY:  Mr. Hoose is a 77 year old white male status post carotid endarterectomy.  An attempt was made to place a Foley in the OR, but was unsuccessful due to distal stricturing.  The patient has recently recovered from anesthetic, but reports no prior urologic history. However, he has been managed with a condom catheter.  REVIEW OF SYSTEMS:  He does void some urgency, but otherwise level 5 Caveat applied.  PAST HISTORY: Significant for coronary artery disease with prior coronary cath.  He has had a history of transitional cell carcinoma of the bladder according to the report, history of a prior right frontoparietal CVA, history of atrial fibrillation, history diabetes, hypertension, memory loss, and B12 deficiency.  MEDICATIONS: Aspirin, Plavix, niacin, metoprolol, lisinopril, hydrochlorothiazide, glipizide, Norvasc.  SOCIAL HISTORY AND FAMILY HISTORY: Not obtainable from the patient.  PHYSICAL EXAMINATION:  VITAL SIGNS:  On the chart, temperature was 98.5, blood pressure 160/72, heart rate 70. GENERAL:  He is an elderly, well-developed white male who is recovering from anesthesia in the PACU.  He is able to answer simple questions. ABDOMEN:  Soft and flat with some suprapubic fullness and tenderness. GU:  Tight cyanotic foreskin with some induration and chronic inflammation.  No penile tenderness is noted.  Scrotum is unremarkable. Testicles are bilaterally descended.  Epididymides are unremarkable. RECTAL:  Deferred.  IMPRESSION:  Phimosis with postoperative retention.  PLAN:  I will dilate the prepuce and attempt Foley catheter  placement.     Excell Seltzer. Annabell Howells, M.D.     JJW/MEDQ  D:  01/17/2011  T:  01/18/2011  Job:  [id number]  cc:   Jorge Ny, MD [facility]  Electronically Signed by Bjorn Pippin M.D. on 01/21/2011 01:00:22 PM

## 2011-01-21 NOTE — Op Note (Signed)
  NAMEGANESH, DEEG NO.:  [id number]  MEDICAL RECORD NO.:  [id number]  LOCATION:                                 FACILITY:  PHYSICIAN:  Excell Seltzer. Annabell Howells, M.D.    DATE OF BIRTH:  07/06/1917  DATE OF PROCEDURE:  01/17/2011 DATE OF DISCHARGE:                              OPERATIVE REPORT   PROCEDURE:  Urethral/foreskin dilation with Foley insertion.  PREOPERATIVE DIAGNOSIS:  Phimosis.  POSTOPERATIVE DIAGNOSIS:  Phimosis with difficult Foley placement.  SURGEON:  Excell Seltzer. Annabell Howells, MD  ANESTHESIA:  General.  DRAIN:  A 14-French Foley catheter.  COMPLICATIONS:  None.  INDICATIONS:  Mr. Main is a 77 year old white male who underwent a carotid endarterectomy today.  He had been managed with a condom catheter preoperatively.  An attempt to place Foley catheter in the operating room was unsuccessful due to distal narrowing described as a stricture.  However, on my examination, the patient has very tight phimotic foreskin and that was felt to be the problem.  FINDINGS AND PROCEDURE:  His penis was prepped with Betadine solution. He was draped with sterile towels.  A male sound 18-French was then passed through the phimotic foreskin and I was able to locate urethral meatus blindly and advanced the sound dilating the stenotic prepuce.  I then used a second 20-French sound to further dilate the prepuce.  At this point, a 31- [place] Foley catheter was inserted without further difficulty into the bladder with return of yellow slightly cloudy urine.  The balloon was filled with 10 mL sterile fluid and the catheter was left to straight drainage.  There were no complications during the procedure.     Excell Seltzer. Annabell Howells, M.D.     JJW/MEDQ  D:  01/17/2011  T:  01/18/2011  Job:  [id number]  cc:   [facility]. Charlena Cross, MD  Electronically Signed by Bjorn Pippin M.D. on 01/21/2011 01:00:25 PM

## 2011-01-24 NOTE — Discharge Summary (Signed)
[name], [name] NO.:  [id number]  MEDICAL RECORD NO.:  [id number]  LOCATION:  3011                         FACILITY:  [facility]  PHYSICIAN:  Thad Ranger, MD       DATE OF BIRTH:  1917/05/29  DATE OF ADMISSION:  01/13/2011 DATE OF DISCHARGE:  01/20/2011                        DISCHARGE SUMMARY - REFERRING   PRIMARY CARE PHYSICIAN:  Thora Lance, MD, [facility].  PRIMARY CARDIOLOGIST:  Armanda Magic, MD.  FINAL DIAGNOSES: 1. Symptomatic right internal carotid artery stenosis greater than     80%. 2. Status post right carotid endarterectomy and bovine patch     angioplasty. 3. Phimosis with postoperative retention, status post Foley catheter     placement. 4. History of coronary artery disease. 5. Atrial fibrillation. 6. Possible underlying atrial flutter rhythm. 7. History of right brain stroke. 8. History of transitional cell carcinoma bladder, status post     resection. 9. Diabetes. 10.Hypertension. 11.B12 deficiency. 12.Acute subcentimeter multifocal areas of acute infarction affecting     the right hemisphere.  CONSULTATIONS: 1. Neurology Dr. Lesia Sago. 2. Vascular Surgery, Dr. Waverly Ferrari. 3. Cardiology, Dr. Donato Schultz. 4. Urology, Dr. Bjorn Pippin.  DISCHARGE MEDICATIONS: 1. Amlodipine 10 mg daily. 2. Vitamin B12 1000 mcg one tablet daily. 3. Plavix 75 mg p.o. daily, to discontinue Plavix once INR is above 2. 4. Warfarin 5 mg p.o. daily. 5. Aspirin 81 mg p.o. daily. 6. Cilostazol 100 mg p.o. b.i.d. 7. Glipizide 10 mg daily. 8. Lisinopril/hydrochlorothiazide 20/25 one tablet daily. 9. Metoprolol-XL 25 mg p.o. daily. 10.Multivitamin one tablet p.o. daily. 11.Niacin 500 mg p.o. daily at bedtime. 12.Vitamin E 4000 units one capsule p.o. daily.  BRIEF HISTORY OF PRESENT ILLNESS AT THE TIME OF ADMISSION:  The patient is a 77 year old male with complicated medical history as above, presented with  slurred speech and left-sided weakness that started approximately a day prior to the admission.  Per the patient's daughter, they report the patient had noticed slurring speech over the past week prior to admission, multiple episodes lasting about several minutes and subsequently returning to baseline.  The patient was started on Plavix for this complaint by his PCP, Dr. Valentina Lucks and was also referred to Dr. Imogene Burn, Vascular Surgeon who had performed carotid ultrasound showing high- grade stenosis in the right internal carotid.  Upon arrival to the ED, the patient reported left-sided weakness and slowed speech approximately lasting about 5 minutes.  The patient was admitted for further workup.  RADIOLOGICAL DATA:  CT head without contrast, January 14, 2011, showed the demonstrated atrophy, old infarction, extensive microvascular ischemic disease without definitive superimposed acute intracranial process.  MRI of the head showed 1 acute subcentimeter of multifocal areas of acute infarction affecting the right hemisphere, involvement of the motor strip account for the patient's left-sided weakness, advanced atrophy and small vessel disease, remote left occipital infarct.  MRA showed one diminished intensity of the flow related enhancement and right internal carotid arteries.  Skull base segments without correlated diminished caliber.  Proximal right carotid stenosis is not excluded. Chronic disease, left PCA.  Chest x-ray October 8 showed stable chest x- ray with basilar fibrotic changes of asbestosis with calcified  plaques and calcified hemidiaphragm, stable cardiomegaly.  Echocardiogram, October 5 showed EF of 65-70%, wall motion normal, no regional wall motion abnormalities, grade 1 diastolic dysfunction.  PROCEDURES:  Right carotid endarterectomy and bovine patch angioplasty on January 17, 2011.  BRIEF HOSPITALIZATION COURSE:  Right carotid artery occlusion with subcentimeter infarction  noted on MRI.  The patient was admitted by hospitalist service, and the patient already had carotid ultrasound that had shown high-grade stenosis in right internal carotid by Dr. Imogene Burn. The patient was followed by Vascular Surgery, and decision was determined to proceed with CEA.  The patient had right carotid endarterectomy on January 17, 2011 with bovine angioplasty.  Neurology was also consulted and the patient was continued on aspirin treatment and risk stratification with the control of hypertension and hyperlipidemia.  Also during the hospitalization, the patient was in regular rhythm, but there was a question of atrial fibrillation and underlying atrial flutter rhythm.  There was a variable conduction of atrial flutter and underlying atrial fibrillation, hence Cardiology was consulted.  The patient was thoroughly evaluated by Dr. Donato Schultz who did feel the diagnosis was actually challenging whether the patient had atrial fibrillation or not.  However given his increased risk and significant risk for stroke, his [place] score would be at least 6-7 including the diabetes and advocated Coumadin use.  Coumadin was started, and per recommendation, the patient is to discontinue Plavix once the INR is above 2.  Given high risk of bleeding, aspirin 81 mg daily is reasonable per Cardiology.  The patient was cleared by Cardiology and Vascular Surgery to be discharged home.  PHYSICAL EXAMINATION:  VITAL SIGNS:  At time of dictation, temperature 99.9, pulse 81 respirations 18, blood pressure 146/59, O2 sats 95% on room air. GENERAL:  The patient is alert, awake, and oriented, not in any acute distress. HEENT:  Anicteric sclerae.  Conjunctivae clear.  Pupils are reactive to light and accommodation.  EOMI. NECK:  Dressing intact on the right side. CVS:  S1, S2 clear. CHEST:  Fairly clear to auscultation bilaterally. ABDOMEN:  Soft, nontender, nondistended.  Normal bowel sounds. EXTREMITIES:   No cyanosis.  There is clubbing noted in upper or lower extremities.  DISCHARGE TIME:  45 minutes.  DISCHARGE FOLLOWUP:  With Dr. Kirby Funk within next week and Dr. Gloris Manchester Turner/Dr. Anne Fu within next 1 week.  The patient is to have PT/INR check on Monday at Dr. Norris Cross office or Dr. Jone Baseman office.     Thad Ranger, MD     RR/MEDQ  D:  01/20/2011  T:  01/20/2011  Job:  [id number]  cc:   Armanda Magic, M.D. Jake Bathe, MD Thora Lance, M.D. Pramod P. Pearlean Brownie, MD Fransisco Hertz, MD  Electronically Signed by Andres Labrum Dia Donate  on 01/24/2011 02:20:07 PM

## 2011-01-26 NOTE — Op Note (Signed)
[name], [name] NO.:  [id number]  MEDICAL RECORD NO.:  [id number]  LOCATION:  3307                         FACILITY:  [facility]  PHYSICIAN:  Fransisco Hertz, MD       DATE OF BIRTH:  30-Oct-1917  DATE OF PROCEDURE: DATE OF DISCHARGE:                              OPERATIVE REPORT   PROCEDURES: 1. Right carotid endarterectomy and bovine patch angioplasty. 2. Intraoperative carotid duplex.  PREOPERATIVE DIAGNOSIS:  Symptomatic right internal carotid artery stenosis greater than 80%.  POSTOPERATIVE DIAGNOSIS:  Symptomatic right internal carotid artery stenosis greater than 80%.  SURGEON:  Fransisco Hertz, MD  ASSISTANT:  Della Goo, PA-C  ANESTHESIA:  General.  FINDINGS:  Severe calcification from the distal bifurcation into the external and internal carotid arteries.  There was no flap visualized on both longitudinal and transverse carotid duplex completed at the end of the case.  SPECIMEN:  Carotid plaque which was sent to Pathology.  ESTIMATED BLOOD LOSS:  Minimal.  INDICATIONS:  This is a 77 year old gentleman who was seen in the office, found to have symptomatic right internal carotid artery stenosis.  He underwent preoperative cardiac evaluation.  Unfortunately, before he came back for his surgery, he had another event and recently was admitted to the hospital.  His cardiac clearance was completed this morning and he is now scheduled for his right carotid endarterectomy. He is aware of the risks of this procedure include bleeding, infection, possible stroke, possible myocardial infarction, possible permanent cranial nerve injury, possible neck hematoma, and possible need for additional procedures in the future.  He is aware of these risks and agreed to proceed forward.  DESCRIPTION OF OPERATION:  After full informed written consent was obtained from the patient, he was brought back to the operating room and placed supine upon the operating  table.  Prior to induction, he had received IV antibiotics.  He was then prepped and draped in the standard fashion for a right carotid endarterectomy.  Note, prior to proceeding, he had a left brachial A-line placed in the holding area, and attempt to was may need to place a Foley on him to monitor his urine output, however, he appears to have some balanitis and phimosis.  It was not possible to safely place Foley in this gentleman.  Subsequently, a condom catheter was replaced to try to measure his urine output during this case.  At this point, he was prepped and draped in a standard fashion for right carotid endarterectomy.  After obtaining adequate anesthesia, we turned our attention first to his right neck.  An incision was made anterior to the sternocleidomastoid and I developed a plane down through the subcutaneous tissue down to the platysmas which I divided with electrocautery.  I then was able to dissect the sternocleidomastoid posteriorly.  Note, on entering into this patient's neck, there was extensive amount of scar tissue and it was as if somebody previously had performed some surgery in his neck.  We were able to eventually visualize the jugular vein displaced significantly more laterally than usual.  We were able to dissect down to the common carotid artery.  This was dissected out and then an umbilical tape  was placed and then Rumel tourniquet applied loosely.  We then carried our dissection more superiorly.  In this process, we identified the hypoglossal nerve.  Note that the hypoglossal nerve was actually inferior to the facial vein. We dissected out the facial vein and transected it after tying it off.  This was necessary to fully visualize the bifurcation.  I then started dissecting from the common carotid artery in a periadventitial fashion up to the bifurcation.  Eventually, I was able to dissect out the external carotid artery and superior thyroid artery.  The  vessel loops were placed around the external carotid artery and a looped 2-0 silk was placed around the external superior thyroid artery.  I then carried my dissection down to the internal carotid artery.  In a slow meticulous fashion, we were able to dissect out this artery well way from the nerve which I had released from the connective tissue to allow it to retract more superiorly. Eventually, I was able to get to a soft part of this artery and I was able to above place an umbilical tape around this artery and I placed a Rumel tourniquet.  At this point, we gave the patient 8000 units of heparin which was a therapeutic bolus for him and then I prepared the bovine pericardial patch and also prepared a 10 shunt.  At this point, after waiting 3 minutes, I clamped off first the internal carotid artery, external carotid artery, and the common carotid artery.  I made an arteriotomy with a #11 blade and with great difficulty extended this arteriotomy into the bifurcation with the Potts scissor.  This lesion here was extremely calcified.  With great difficulty, eventually I was able to get through this plaque into the internal carotid artery where it was soft.  At this point, I took the shunt and inserted it into the internal carotid artery and then we came down with Rumel tourniquet.  I then inserted the other end of this into the common carotid artery and came down with the Rumel tourniquet and released the clamp on the shunt and resumed the perfusion in this case.  At this point, I verified perfusion with Doppler.  There was intact flow via the shunt.  At this point, we proceeded to start the endarterectomy.  I extended the arteriotomy in the common carotid artery more proximally to facilitate this endarterectomy.  Using the [facility], I was able to dissect down into the common carotid artery and then transect the plaque where it was densely adherent to the wall and I was able to  carry this dissection out circumferentially.  Then, we carried this dissection into the external carotid artery.  Note, this was severely calcified and I had to essentially break off the external carotid artery plaque to facilitate the dissection.  I was able to clean out as much of the external carotid as possible and I carried the dissection into the bifurcation.  This dissection proceeded in a straightforward fashion. This severely calcified plaque feathered out in the internal carotid artery without any difficulties and I was able to extract this portion of the plaque in a single piece and passed off the field as a specimen. At this point, for about 30 minutes, we spent extracting intimal flaps and any free-floating debris.  The artery was flushed repeatedly with heparinized saline to help prevent any thrombus collecting on the wall. By the end of the case, there were no more free-floating flaps in the internal carotid artery.  Everything residual was densely adherent.  In a  similar fashion, there was no intimal debris flaps present in the bifurcation  or into the common carotid artery, so at this point there was no more debris that could be removed without compromising the integrity of the wall.  At this point, we fashioned the bovine pericardial patch for  the geometry of this patient's artery.  It was sewn in place with 2  running stitches of 6-0 Prolene running from the above and below and tying in the middle.  Prior to completing this patch angioplasty,  the  shunt was removed from the internal artery first and then the  common carotid artery.  Both arteries were bled in this process, with  excellent backbleeding.  Clamps were established on the internal and common carotid arteries for less than 3 minutes.  I also backbled the external  carotid artery which had good backbleeding.   I flushed out the patched carotid artery with heparinized saline and completed the patch  angioplasty. I then opened the external carotid artery and held pressure to the internal carotid artery and then released the clamp on the common carotid artery. Finally, after a few seconds, I release pressure on the internal carotid artery  and the clamp on it.  There was no active bleeding.  As a precaution, I gave 30 mg of Protamine to reverse anticoagulation.  Thrombin and gelfoam was applied to the surgical wound.  Meanwhile, I obtained a TLS drain system, as this patient is on Plavix and aspirin.  The drain was delivered through the  subcutaneoustissue, in the inferior aspect of the wound, sharply with the trocar  attached to the drain.  The drain was route a few centimeters inferior to the  incision.  I trimed the drain to appropriate length of the wound.  At this point,  no further bleeding was present.  I was washed out the wound one last time to  verify no active bleeding.  The platysmas was closed with a running stitch of  3-0 vicryl.  The skin was then reapproximated with a running subcuticular of  4-0 Monocryl.  The skin closure was reinforced with Dermabond.  I docked the test  tube connecter onto the external portion of the TLS drain and applied the test tube  to obtain some suction on the drain.  The drain was secured to the skin with a  4-0 Nylon.  Complications: none  Condition: stable     Fransisco Hertz, MD BLC/MEDQ  D:  01/17/2011  T:  01/18/2011  Job:  [id number]  Electronically Signed by Leonides Sake MD on 01/26/2011 09:54:50 PM

## 2011-01-27 NOTE — H&P (Signed)
NAMEMarland Kitchen  KENNIEL, BERGSMA NO.:  [id number]  MEDICAL RECORD NO.:  [id number]  LOCATION:  3705                         FACILITY:  [facility]  PHYSICIAN:  Carlota Raspberry, MD         DATE OF BIRTH:  08/15/1917  DATE OF ADMISSION:  01/13/2011 DATE OF DISCHARGE:                             HISTORY & PHYSICAL   PRIMARY CARE PHYSICIAN:  Thora Lance, MD  CARDIOLOGIST:  Armanda Magic, MD  CHIEF COMPLAINT:  Slurred speech, suspect TIA.  HISTORY OF PRESENT ILLNESS:  This is a 77 year old male with a history of hypertension; diabetes; coronary artery disease status post DES to RCA, LAD, and diagonal in November 2006; and ? history of AFib, not on Coumadin, who presents with several episodes of neurological changes over the past couple of weeks.  The daughter reports that over the past couple of weeks he has had episodes of garbled speech that have lasted less than a minute each and he has returned to baseline.  He was started on Plavix for these complaints by his PCP, Dr. Valentina Lucks, within the past week and was also referred to vascular surgeon, Dr. Imogene Burn, who within the past month performed a carotid ultrasound, which showed a high-grade stenosis in the right internal carotid.  Today, the patient was playing checkers with his son at [facility] and had a 5-minute episode of him slumping forward into the left with garbled and confused speech.  It lasted 5 minutes and then he got back to his baseline.  The patient did not lose consciousness during all this and remembers most of it.  He was brought to the emergency room where on arrival he was hypertensive, 149/85, with a pulse 117.  His workup in the ED has been unremarkable including a CT head, which showed prior evidence of infarcts, but no acute superimposed process.  He also appears to be in acute renal failure with a creatinine of 2.0.  His neurological changes were noted to be resolved by the time he got to the emergency  room, and his family stated that he was completely back to his baseline.  He has been given 325 of aspirin.  REVIEW OF SYSTEMS:  As above, otherwise negative.  The patient currently states that he is having no complaints.  PAST MEDICAL HISTORY: 1. CAD status post cath in November 2006 with DES to RCA and DES to     LAD and diagonal. 2. Bladder transitional cell carcinoma. 3. Prior CVA in the right frontoparietal and left medial occipital     lobes noted on imaging. 4. ? History of AFib:  His daughter states that he has AFib and is not     on Coumadin.  However, we do not have this listed in the records     and his only prior EKG shows sinus bradycardia. 5. Diabetes. 6. Hypertension. 7. Memory loss. 8. B12 deficiency.  HOME MEDICATIONS:  Unable to be reconciled at this time with the daughter or with the patient.  They do state that he was started on Plavix within the last week and was also taken a full-dose aspirin; however, they do not know his other medication list.  There was nothing in the E-chart and nothing in the ED chart either.  ALLERGIES:  Listed are to PENICILLIN.  SOCIAL HISTORY:  He still lives at home with one of his children and has 3 children.  He is still quite active and in fairly good shape for his age.  He is a nonsmoker, nondrinker.  PHYSICAL EXAMINATION:  VITAL SIGNS:  Blood pressure 182/79, ranging 150- 160 for the most part through his ED course; he is 99% on room air; respirations 13; pulse 88. GENERAL:  He is a large elderly male who actually appears younger than stated age.  He is in no distress and is able to answer questions appropriately. HEENT:  His pupils are equal, round, and reactive to light.  His extraocular muscles are intact.  His sclerae are clear.  His mouth is normal appearing with no oropharyngeal lesions. LUNGS:  Fairly clear to auscultation bilaterally with no wheezes, crackles, rales or rhonchi. HEART:  Fairly soft with distant  heart sounds, but S1 and S2 are heard. It is difficult to tell whether it is regular though. ABDOMEN:  Soft, nontender, nondistended and benign. EXTREMITIES:  Warm, well perfused with no cyanosis, clubbing, or edema. NEUROLOGIC:  Grossly nonfocal.  He knows he is at [facility] and that it was Thursday yesterday, but does not know the year.  He can name his 3 children and his birthday.  He has intact strength in his proximal and distal muscle groups for both his upper and lower extremities and in fact for his age, he is quite robust in his strength.  His sensation is intact throughout.  His heel-shin testing and finger-nose-finger testing is completely normal.  Overall, I do not notice any gross neurological deficits at all.  LABORATORY WORK:  White blood cell count 6.8; hematocrit 30.5, within his baseline; platelets 174.  INR is 1.03.  His chemistry is normal except a glucose of 170, and his renal function is above baseline at 35 and 2.03 with a baseline of 1.2 to 1.3 in 2007.  His calcium is 9.4.  RADIOGRAPHY:  Carotid Dopplers done on December 30, 2010 show markedly elevated velocity in the right carotid bulb associated with extensive plaque and the stenosis being called as most likely greater than 90%. The left internal carotid artery has a less than 50% stenosis.  CT head shows re-demonstrated atrophy and old infarctions and extensive microvascular ischemic disease without an acute superimposed process.  EKG is tachycardic.  To be frank, it is difficult to tell whether it is sinus, given very small P-waves and an extensive amount of PACs.  The P- waves that are apparent do seem to have consistent PR intervals thereby favoring sinus tachycardia as underlying rhythm.  There is left axis. There is poor R-wave progression in the precordial leads, but the QRS is narrow at 88 milliseconds.  T-waves all appear appropriate.  Overall, this appears unchanged except for the rate and the  difficulty with determining the rhythm and it does not appear acutely ischemic.  IMPRESSION:  This is a 77 year old male with a history of coronary artery disease status post drug-eluting stent x3 to right coronary artery, left anterior descending, diagonal; history of prior cerebrovascular accident to the right frontoparietal and left medial occipital; and ? history of atrial fibrillation who presents with 3 episodes of TIAs in the past couple of weeks, in the setting of known high-grade right carotid stenosis. 1. Transient ischemic attack:  We will admit the patient under TIA  admission orders.  We will get an MRI and an MRA of his brain.     Would recommend a neurology consult in the morning.  We will also     get a 2-D echocardiogram and repeat his EKG to determine if he     truly is in fib or not given.  We will get an A1c and lipid panel.     We will continue full-dose aspirin and Plavix.  We will start him     on sliding scale insulin and continue to monitor his blood     pressures for now for a target of less than 180, so as to not drop     his cerebral perfusion pressure too much.  If we are able to get     his home medication reconciliation done, then we will just continue     his home antihypertensives. I did broach the big picture with his daughter, especially in light of the fact of known high-grade carotid stenosis and questionable history of atrial fibrillation.  I think that a frank discussion will need to be have about the risks and benefits of either carotid endarterectomy versus carotid stenting versus Coumadin for the AFib, but we can wait until the light of day to have this discussion with him and his family. 1. Acute renal failure:  We will give him some empiric IV fluids and     follow his BMET.  His creatinine is above baseline, but it has not     been measured in our systems since 2007, so his creatinine at 2.0     could be his baseline given his advanced  age. 2. Diabetes:  We will cover him with sliding scale insulin while he is     admitted. 3. Hypertension:  We will permit him to run less than 180 per current     guidelines for TIA management. 4. Fluids, electrolytes, and nutrition:  We will give him some gentle     IV fluids for his creatinine and when he passes a swallow study, we     will start a diabetic heart-healthy diet. 5. IV access:  He has 1 peripheral. 6. Prophylaxis:  Lovenox subcu for DVT prophylaxis, Colace, and senna. 7. Code status:  He is a full code.  I discussed this with him and his     family.  We will admit him to telemetry team 7.          ______________________________ Carlota Raspberry, MD     EB/MEDQ  D:  01/14/2011  T:  01/14/2011  Job:  [id number]  Electronically Signed by Carlota Raspberry MD on 01/27/2011 07:47:10 PM

## 2011-02-03 ENCOUNTER — Encounter: Payer: Self-pay | Admitting: Vascular Surgery

## 2011-02-04 ENCOUNTER — Ambulatory Visit (INDEPENDENT_AMBULATORY_CARE_PROVIDER_SITE_OTHER): Payer: Medicare HMO | Admitting: Vascular Surgery

## 2011-02-04 ENCOUNTER — Encounter: Payer: Self-pay | Admitting: Vascular Surgery

## 2011-02-04 VITALS — BP 178/90 | HR 87 | Temp 98.2°F | Ht 69.0 in | Wt 231.0 lb

## 2011-02-04 DIAGNOSIS — I6529 Occlusion and stenosis of unspecified carotid artery: Secondary | ICD-10-CM

## 2011-02-04 DIAGNOSIS — Z9889 Other specified postprocedural states: Secondary | ICD-10-CM

## 2011-02-04 NOTE — Progress Notes (Signed)
[facility] [place]  Postoperative Visit  History of Present Illness  [name] is a 77 y.o. male who presents for postoperative follow-up for: R CEA (Date: 01/17/11).  The patient's neck incision is healing.  The patient has had a small amount of clear drainage from a "knot" in his neck. The patient has had no further stroke or TIA symptoms.  Physical Examination  Filed Vitals:   02/04/11 1547  BP: 178/90  Pulse: 87  Temp:     R Neck: Incision is healing, palpable small superficial seroma with serous drainage, no erythema Neuro: CN 2-12 are intact , Motor strength is 5/5 except L arm 4-5/5, sensation is grossly intact  Medical Decision Making  [name] is a 77 y.o. male who presents s/p R CEA. The patient's neck incision is healing with a small spontaneously draining seroma without any evidence of infection I discussed in depth with the patient the nature of atherosclerosis, and emphasized the importance of maximal medical management including strict control of blood pressure, blood glucose, and lipid levels, obtaining regular exercise, and cessation of smoking.  The patient is aware that without maximal medical management the underlying atherosclerotic disease process will progress, limiting the benefit of any interventions. The patient's surveillance will included routine carotid duplex studies which will be completed in: 3 months, at which time the patient will be re-evaluated.   I emphasized the importance of routine surveillance of the carotid arteries as recurrence of stenosis is possible, especially with proper management of underlying atherosclerotic disease. The patient agrees to participate in their maximal medical care and routine surveillance.  Thank you for allowing [place] to participate in this patient's care.  Leonides Sake, MD [facility] [place] Office: [phone] Pager: [phone]

## 2011-02-06 NOTE — Consult Note (Signed)
[name], [name] NO.:  [id number]  MEDICAL RECORD NO.:  [id number]  LOCATION:  3307                         FACILITY:  [facility]  PHYSICIAN:  Debbora Presto, MD DATE OF BIRTH:  1917/06/18  DATE OF CONSULTATION:  01/17/2011 DATE OF DISCHARGE:                                CONSULTATION   CURRENT DIAGNOSES DURING THE HOSPITALIZATION: 1. Right carotid stenosis. 2. Acute subcentimeter multifocal areas of acute infarction affecting     right hemisphere. 3. Hypertension. 4. Coronary artery disease, status post catheterization in November     2006 with drug-eluting stent to right coronary artery and drug-     eluting stent to left anterior descending coronary artery and     diagonal. 5. Bladder transitional cell carcinoma. 6. Prior cerebrovascular accident in the right frontoparietal and left     medial occipital lobe. 7. Questionable history of atrial fibrillation, the patient not on     Coumadin. 8. Diabetes.  MEDICATIONS DURING THE HOSPITALIZATION: 1. Norvasc 10 mg tablet once daily. 2. Aspirin 325 mg once daily. 3. Plavix 75 mg tablet daily. 4. Lovenox for deep venous thrombosis prophylaxis. 5. Glipizide 10 mg tablet daily with meals. 6. Hydrochlorothiazide 25 mg tablet daily. 7. Insulin sliding scale per protocol. 8. Lisinopril 20 mg tablet once daily. 9. Metoprolol XL 25 mg once daily. 10.Multivitamin 1 tablet daily. 11.Niacin 500 mg tablet at bedtime.  BRIEF HISTORY OF PRESENT ILLNESS:  The patient is a very pleasant 77- year-old male with history noted above, who presented to [facility] with main concern of slurred speech and left-sided weakness that started approximately a day prior to admission.  Per the daughter's report, however, the patient has noticed slurred speech over the past week prior to admission, multiple episodes each lasting several minutes and subsequently returning to baseline.  Of note, the patient was started on  Plavix for these complaints by his PCP, Dr. Valentina Lucks and was also referred to Dr. Imogene Burn, vascular surgeon, who performed carotid ultrasound showing high-grade stenosis in the right internal carotid. Upon arrival to the ED, the patient reports being with his son and had left-sided weakness as noted above with slurred speech.  This lasted approximately 5 minutes and has returned to baseline.  The patient denied losing consciousness, and denies headaches or visual changes.  No chest pain or shortness of breath.  No abdominal or urinary concerns.  PHYSICAL EXAMINATION:  VITAL SIGNS:  Temperature 98.4, pulse 61, respirations 18, blood pressure 176/71, saturating 100% on room air. GENERAL:  Lying in bed, not in acute distress. CARDIOVASCULAR:  S1 and S2 present.  Regular rate and rhythm.  No murmurs, rubs, or gallops. LUNGS:  Clear to auscultation bilaterally. ABDOMEN:  Soft, nontender, nondistended.  Bowel sounds present. EXTREMITIES:  No edema. NEUROLOGIC:  Left upper arm weakness 4/5 strength, 5/5 strength on the right upper extremities, 5/5 strength in bilateral lower extremities. Sensation intact to soft touch bilaterally, intact cerebellar.  CONSULTATIONS:  Neurology.  DIAGNOSTIC STUDIES: 1. January 14, 2011, CT of the head without contrast.  Old infarctions     and extensive microvascular ischemic disease without definite     superimposed acute intracranial process. 2. January 14, 2011, MRI of the head with MRI of the brain.  Acute     subcentimeter multifocal areas of acute infarction affecting the     right hemisphere accounting for the patient's left-sided weakness.     Diminished intensity of flow-related enhancement in the right     internal carotid artery skull-based segments without correlated     diminished caliber.  Proximal right carotid stenosis is not     excluded.  Chronic diseased left PCA, suspect short-segment     occlusion, distal left vertebral.  HOSPITAL COURSE BY  PROBLEM: 1. Right carotid artery occlusion.  This has been followed by Vascular     Surgery and decision was determined to proceed with CEA.  The     patient was scheduled to have CEA today, January 17, 2011.  We will     continue to follow up recommendations. 2. Subcentimeter infarctions noted per MRI, Neurology was consulted     and for right now, the patient will continue aspirin treatment with     the rest of the medical management and risk stratification with     control of hypertension and hyperlipidemia.  Of note, the patient     is in regular rhythm during the monitor on telemetry.  He never     demonstrated AFib episodes and therefore no need for Coumadin     initiation. 3. Hypertension, well controlled during the hospitalization.  Initial     systolic blood pressure goal remains between 120s and 170s.  Home     blood pressure medications were resumed and blood pressure will     need to be continuously monitored. 4. Hyperlipidemia.  Continue home regimen.  Over 30 minutes spent on today's visit.     Debbora Presto, MD   ______________________________ Debbora Presto, MD    IM/MEDQ  D:  01/17/2011  T:  01/18/2011  Job:  [id number]  Electronically Signed by Debbora Presto MD on 02/06/2011 12:38:15 PM

## 2011-05-06 ENCOUNTER — Other Ambulatory Visit: Payer: Medicare HMO

## 2011-05-20 ENCOUNTER — Other Ambulatory Visit: Payer: Medicare HMO

## 2011-05-25 ENCOUNTER — Other Ambulatory Visit (INDEPENDENT_AMBULATORY_CARE_PROVIDER_SITE_OTHER): Payer: Medicare HMO | Admitting: *Deleted

## 2011-05-25 DIAGNOSIS — I6529 Occlusion and stenosis of unspecified carotid artery: Secondary | ICD-10-CM

## 2011-05-25 DIAGNOSIS — Z48812 Encounter for surgical aftercare following surgery on the circulatory system: Secondary | ICD-10-CM

## 2011-05-30 ENCOUNTER — Encounter: Payer: Self-pay | Admitting: Vascular Surgery

## 2011-05-30 NOTE — Procedures (Unsigned)
CAROTID DUPLEX EXAM  INDICATION:  Follow up carotid artery disease.  HISTORY: Diabetes:  Yes Cardiac:  Cardiac stents, arrhythmias, on Coumadin. Hypertension:  Yes Smoking:  Previously. Previous Surgery:  Right carotid endarterectomy 01/17/2011 CV History:  CVA 2012 Amaurosis Fugax: No. Paresthesias: No. Hemiparesis:  No                                      RIGHT             LEFT Brachial systolic pressure:         144               160 Brachial Doppler waveforms:         Triphasic         Triphasic Vertebral direction of flow:        Antegrade         Antegrade DUPLEX VELOCITIES (cm/sec) CCA peak systolic                   78                66 ECA peak systolic                   306               240 ICA peak systolic                   80 (distal)       103 (distal) ICA end diastolic                   29                33 PLAQUE MORPHOLOGY:                  Soft              Mixed, irregular PLAQUE AMOUNT:                      Mild              Mild PLAQUE LOCATION:                    ICA, ECA          Bifurcation/ICA and ECA  IMPRESSION: 1. Patent right carotid endarterectomy site with no evidence for     restenosis. 2. 0 to 39% left ICA stenosis. 3. ECA stenosis bilaterally. 4. Vertebral artery flow is antegrade bilaterally.  ___________________________________________ Fransisco Hertz, MD  SS/MEDQ  D:  05/25/2011  T:  05/25/2011  Job:  [id number]

## 2011-06-15 ENCOUNTER — Other Ambulatory Visit: Payer: Self-pay | Admitting: *Deleted

## 2011-06-15 DIAGNOSIS — I6529 Occlusion and stenosis of unspecified carotid artery: Secondary | ICD-10-CM

## 2011-06-15 DIAGNOSIS — Z48812 Encounter for surgical aftercare following surgery on the circulatory system: Secondary | ICD-10-CM

## 2012-03-17 ENCOUNTER — Emergency Department (HOSPITAL_COMMUNITY)
Admission: EM | Admit: 2012-03-17 | Discharge: 2012-03-17 | Disposition: A | Discharge status: Home / Self Care | Payer: Medicare HMO | Source: Home / Self Care | Attending: Family Medicine | Admitting: Family Medicine

## 2012-03-17 ENCOUNTER — Encounter (HOSPITAL_COMMUNITY): Payer: Self-pay | Admitting: *Deleted

## 2012-03-17 DIAGNOSIS — M722 Plantar fascial fibromatosis: Secondary | ICD-10-CM

## 2012-03-17 NOTE — ED Provider Notes (Signed)
History     CSN: [id number]  Arrival date & time 03/17/12  4098   First MD Initiated Contact with Patient 03/17/12 [phone]      Chief Complaint  Patient presents with  . Foot Pain    (Consider location/radiation/quality/duration/timing/severity/associated sxs/prior treatment) Patient is a 78 y.o. male presenting with lower extremity pain. The history is provided by the patient and a relative.  Foot Pain This is a new problem. The current episode started more than 2 days ago. The problem has not changed since onset.Associated symptoms comments: Worse when in bed, not with walking..    Past Medical History  Diagnosis Date  . Diabetes mellitus   . Hypertension   . Macular degeneration   . Bladder tumor 1998  . Cancer 2001    bladder  . Atrial fibrillation   . Peripheral vascular disease   . Hyperlipidemia   . Anemia   . TIA (transient ischemic attack)   . Low back pain     Past Surgical History  Procedure Date  . Bladder tumor excision 1998 & 2001    For Bladder CA  . Coronary angioplasty with stent placement   . Gsw repair in lle   . Orbital fracture surgery 1920's    over Left eye    . Carotid endarterectomy 01/17/11    right Carotid    Family History  Problem Relation Age of Onset  . Stroke Brother   . Heart disease Brother     History  Substance Use Topics  . Smoking status: Former Smoker    Types: Cigarettes    Quit date: 04/12/1939  . Smokeless tobacco: Not on file  . Alcohol Use: No      Review of Systems  Constitutional: Negative.   Musculoskeletal: Negative for myalgias, joint swelling and gait problem.    Allergies  Penicillins  Home Medications   Current Outpatient Rx  Name  Route  Sig  Dispense  Refill  . AMLODIPINE BESYLATE 5 MG PO TABS   Oral   Take 10 mg by mouth daily.          . ASPIRIN EC 325 MG PO TBEC   Oral   Take 325 mg by mouth daily.           Marland Kitchen CILOSTAZOL 100 MG PO TABS   Oral   Take 100 mg by mouth 2 (two)  times daily.           Marland Kitchen CLOPIDOGREL BISULFATE 75 MG PO TABS   Oral   Take 75 mg by mouth daily.           Marland Kitchen GLIPIZIDE 10 MG PO TABS   Oral   Take 10 mg by mouth daily.           Marland Kitchen LISINOPRIL 20 MG PO TABS   Oral   Take 20 mg by mouth daily.           Marland Kitchen METOPROLOL SUCCINATE ER 25 MG PO TB24   Oral   Take 25 mg by mouth daily.           Marland Kitchen PRESERVISION AREDS PO   Oral   Take by mouth.           Marland Kitchen NIACIN ER (ANTIHYPERLIPIDEMIC) 500 MG PO TBCR   Oral   Take 500 mg by mouth at bedtime.           Marland Kitchen NITROGLYCERIN 0.4 MG/HR TD PT24   Transdermal   Place 1 patch  onto the skin every 8 (eight) hours.           Marland Kitchen VITAMIN E 400 UNITS PO CAPS   Oral   Take 400 Units by mouth daily.           . WARFARIN SODIUM 3 MG PO TABS   Oral   Take 3 mg by mouth as directed.             BP 156/92  Pulse 97  Temp 98.8 F (37.1 C) (Oral)  Resp 26  SpO2 96%  Physical Exam  Nursing note and vitals reviewed. Constitutional: He is oriented to person, place, and time. He appears well-developed and well-nourished.  Musculoskeletal: He exhibits edema. He exhibits no tenderness.       1+ edema. No skin breakdown, no infection. bilat.  Neurological: He is alert and oriented to person, place, and time.  Skin: Skin is warm and dry.    ED Course  Procedures (including critical care time)  Labs Reviewed  GLUCOSE, CAPILLARY - Abnormal; Notable for the following:    Glucose-Capillary 116 (*)     All other components within normal limits   No results found.   1. Plantar fasciitis, right       MDM          Linna Hoff, MD 03/17/12 1052

## 2012-03-17 NOTE — ED Notes (Signed)
Pt reports heel pain when laying in the bed - does not hurt while sitting or walking - feet appear to be in good condition, no bruises or open sores - pt is a diabetic/does not check sugar regularly

## 2012-04-29 ENCOUNTER — Encounter (HOSPITAL_COMMUNITY): Payer: Self-pay | Admitting: Emergency Medicine

## 2012-04-29 ENCOUNTER — Emergency Department (HOSPITAL_COMMUNITY)
Admission: EM | Admit: 2012-04-29 | Discharge: 2012-04-29 | Disposition: A | Discharge status: Home / Self Care | Payer: Medicare HMO | Attending: Emergency Medicine | Admitting: Emergency Medicine

## 2012-04-29 ENCOUNTER — Emergency Department (HOSPITAL_COMMUNITY): Payer: Medicare HMO

## 2012-04-29 DIAGNOSIS — Z7901 Long term (current) use of anticoagulants: Secondary | ICD-10-CM | POA: Insufficient documentation

## 2012-04-29 DIAGNOSIS — Z8673 Personal history of transient ischemic attack (TIA), and cerebral infarction without residual deficits: Secondary | ICD-10-CM | POA: Insufficient documentation

## 2012-04-29 DIAGNOSIS — E785 Hyperlipidemia, unspecified: Secondary | ICD-10-CM | POA: Insufficient documentation

## 2012-04-29 DIAGNOSIS — N390 Urinary tract infection, site not specified: Secondary | ICD-10-CM

## 2012-04-29 DIAGNOSIS — Z8679 Personal history of other diseases of the circulatory system: Secondary | ICD-10-CM | POA: Insufficient documentation

## 2012-04-29 DIAGNOSIS — D649 Anemia, unspecified: Secondary | ICD-10-CM | POA: Insufficient documentation

## 2012-04-29 DIAGNOSIS — Z79899 Other long term (current) drug therapy: Secondary | ICD-10-CM | POA: Insufficient documentation

## 2012-04-29 DIAGNOSIS — Z8669 Personal history of other diseases of the nervous system and sense organs: Secondary | ICD-10-CM | POA: Insufficient documentation

## 2012-04-29 DIAGNOSIS — I10 Essential (primary) hypertension: Secondary | ICD-10-CM | POA: Insufficient documentation

## 2012-04-29 DIAGNOSIS — Z87891 Personal history of nicotine dependence: Secondary | ICD-10-CM | POA: Insufficient documentation

## 2012-04-29 DIAGNOSIS — Z862 Personal history of diseases of the blood and blood-forming organs and certain disorders involving the immune mechanism: Secondary | ICD-10-CM | POA: Insufficient documentation

## 2012-04-29 DIAGNOSIS — E119 Type 2 diabetes mellitus without complications: Secondary | ICD-10-CM | POA: Insufficient documentation

## 2012-04-29 DIAGNOSIS — R109 Unspecified abdominal pain: Secondary | ICD-10-CM | POA: Insufficient documentation

## 2012-04-29 DIAGNOSIS — Z87448 Personal history of other diseases of urinary system: Secondary | ICD-10-CM | POA: Insufficient documentation

## 2012-04-29 DIAGNOSIS — E860 Dehydration: Secondary | ICD-10-CM | POA: Insufficient documentation

## 2012-04-29 DIAGNOSIS — Z8551 Personal history of malignant neoplasm of bladder: Secondary | ICD-10-CM | POA: Insufficient documentation

## 2012-04-29 LAB — URINALYSIS, MICROSCOPIC ONLY
Ketones, ur: NEGATIVE mg/dL
Protein, ur: NEGATIVE mg/dL
Urobilinogen, UA: 0.2 mg/dL (ref 0.0–1.0)

## 2012-04-29 LAB — CBC WITH DIFFERENTIAL/PLATELET
Basophils Absolute: 0 10*3/uL (ref 0.0–0.1)
Eosinophils Relative: 4 % (ref 0–5)
HCT: 26 % — ABNORMAL LOW (ref 39.0–52.0)
Hemoglobin: 8.4 g/dL — ABNORMAL LOW (ref 13.0–17.0)
Lymphocytes Relative: 19 % (ref 12–46)
Lymphs Abs: 1 10*3/uL (ref 0.7–4.0)
MCV: 93.2 fL (ref 78.0–100.0)
Monocytes Absolute: 0.5 10*3/uL (ref 0.1–1.0)
Monocytes Relative: 9 % (ref 3–12)
Neutro Abs: 3.6 10*3/uL (ref 1.7–7.7)
RDW: 18.7 % — ABNORMAL HIGH (ref 11.5–15.5)
WBC: 5.4 10*3/uL (ref 4.0–10.5)

## 2012-04-29 LAB — BASIC METABOLIC PANEL
BUN: 31 mg/dL — ABNORMAL HIGH (ref 6–23)
CO2: 23 mEq/L (ref 19–32)
Calcium: 9.3 mg/dL (ref 8.4–10.5)
Chloride: 107 mEq/L (ref 96–112)
Creatinine, Ser: 1.99 mg/dL — ABNORMAL HIGH (ref 0.50–1.35)
Glucose, Bld: 113 mg/dL — ABNORMAL HIGH (ref 70–99)

## 2012-04-29 MED ORDER — SODIUM CHLORIDE 0.9 % IV BOLUS (SEPSIS)
500.0000 mL | Freq: Once | INTRAVENOUS | Status: AC
  Administered 2012-04-29: 500 mL via INTRAVENOUS

## 2012-04-29 MED ORDER — LIDOCAINE HCL 1 % IJ SOLN
INTRAMUSCULAR | Status: AC
  Administered 2012-04-29: 2.1 mL via INTRAMUSCULAR
  Filled 2012-04-29: qty 20

## 2012-04-29 MED ORDER — CEPHALEXIN 500 MG PO CAPS: 500.0000 mg | ORAL_CAPSULE | Freq: Four times a day (QID) | ORAL | Status: DC

## 2012-04-29 MED ORDER — ACETAMINOPHEN 325 MG PO TABS
650.0000 mg | ORAL_TABLET | Freq: Once | ORAL | Status: AC
  Administered 2012-04-29: 650 mg via ORAL
  Filled 2012-04-29: qty 2

## 2012-04-29 MED ORDER — CEFTRIAXONE SODIUM 1 G IJ SOLR
1.0000 g | INTRAMUSCULAR | Status: DC
  Administered 2012-04-29: 1 g via INTRAMUSCULAR
  Filled 2012-04-29: qty 10

## 2012-04-29 MED ORDER — OXYCODONE-ACETAMINOPHEN 5-325 MG PO TABS: 1.0000 | ORAL_TABLET | ORAL | Status: DC | PRN

## 2012-04-29 MED ORDER — SODIUM CHLORIDE 0.9 % IV BOLUS (SEPSIS): 500.0000 mL | Freq: Once | INTRAVENOUS | Status: DC

## 2012-04-29 NOTE — ED Notes (Signed)
EMS called to home.  Found patient lying in bed with complaints of right flank pain. Increased pain with movement. No radiation of pain.  Denies nausea or vomiting.

## 2012-04-29 NOTE — ED Provider Notes (Signed)
11:03 AM The patient feels much better at this time.  He does appear to have urinary tract infection.  IM Rocephin now.  Home with Keflex.  Home with followup with his urologist.  He does have some right flank pain but it seems to be very low right paralumbar discomfort.  I doubt this is pyelonephritis.  His vital signs and white count are normal.  Discharge home in good condition.  He understands to return to the ER for new or worsening symptoms.  PCP and urology followup.  The primary encounter diagnosis was Right flank pain. Diagnoses of Dehydration, Anemia, and Urinary tract infection were also pertinent to this visit.  Ct Abdomen Pelvis Wo Contrast  04/29/2012  *RADIOLOGY REPORT*  Clinical Data: Right-sided back pain.  Patient on Coumadin.  CT ABDOMEN AND PELVIS WITHOUT CONTRAST  Technique:  Multidetector CT imaging of the abdomen and pelvis was performed following the standard protocol without intravenous contrast.  Comparison: None.  Findings: The bilateral pleural effusions with basilar atelectasis or infiltration, greater on the right.  Calcified pleural plaques. Calcified granulomas in the lungs.  Coronary artery calcification. Mild cardiac enlargement.  Calcified granulomas in the liver and spleen.  Unenhanced appearance of the liver, spleen, gallbladder, pancreas, adrenal glands, kidneys, and retroperitoneal lymph nodes is otherwise unremarkable.  Aortic calcification without aneurysm.  Extensive calcification of the celiac axis and of the origin of the mesenteric artery.  Calcifications throughout the mesenteric artery and mesenteric branch vessels.  Small duodenal diverticulum at the second portion.  The stomach, small bowel, and colon are decompressed.  No free air or free fluid in the abdomen.  No abnormal retroperitoneal or mesenteric fluid collections.  Pelvis:  Prostate gland is not enlarged.  The bladder wall is not thickened.  Diverticula in the sigmoid colon without diverticulitis.  The  appendix is not identified.  No free or loculated pelvic fluid collections.  Lipoma in the right gluteus minimus muscle.  Abdominal wall musculature appears intact.  No significant subcutaneous soft tissue infiltration.  No significant lymphadenopathy in the pelvis.  Degenerative changes in the lumbar spine with normal alignment.  IMPRESSION: Bilateral pleural effusions with basilar atelectasis or infiltration, greatest on the right.  No other acute changes demonstrated.  Extensive vascular calcifications.  No evidence of retroperitoneal hematoma.   Original Report Authenticated By: Burman Nieves, M.D.   Results for orders placed during the hospital encounter of 04/29/12  URINALYSIS, MICROSCOPIC ONLY      Component Value Range   Color, Urine YELLOW  YELLOW   APPearance CLOUDY (*) CLEAR   Specific Gravity, Urine 1.012  1.005 - 1.030   pH 6.0  5.0 - 8.0   Glucose, UA NEGATIVE  NEGATIVE mg/dL   Hgb urine dipstick SMALL (*) NEGATIVE   Bilirubin Urine NEGATIVE  NEGATIVE   Ketones, ur NEGATIVE  NEGATIVE mg/dL   Protein, ur NEGATIVE  NEGATIVE mg/dL   Urobilinogen, UA 0.2  0.0 - 1.0 mg/dL   Nitrite NEGATIVE  NEGATIVE   Leukocytes, UA LARGE (*) NEGATIVE   WBC, UA 21-50  <3 WBC/hpf   RBC / HPF 3-6  <3 RBC/hpf   Bacteria, UA MANY (*) RARE   Squamous Epithelial / LPF RARE  RARE  CBC WITH DIFFERENTIAL      Component Value Range   WBC 5.4  4.0 - 10.5 K/uL   RBC 2.79 (*) 4.22 - 5.81 MIL/uL   Hemoglobin 8.4 (*) 13.0 - 17.0 g/dL   HCT 38.7 (*) 56.4 - 33.2 %  MCV 93.2  78.0 - 100.0 fL   MCH 30.1  26.0 - 34.0 pg   MCHC 32.3  30.0 - 36.0 g/dL   RDW 16.1 (*) 09.6 - 04.5 %   Platelets 218  150 - 400 K/uL   Neutrophils Relative 68  43 - 77 %   Neutro Abs 3.6  1.7 - 7.7 K/uL   Lymphocytes Relative 19  12 - 46 %   Lymphs Abs 1.0  0.7 - 4.0 K/uL   Monocytes Relative 9  3 - 12 %   Monocytes Absolute 0.5  0.1 - 1.0 K/uL   Eosinophils Relative 4  0 - 5 %   Eosinophils Absolute 0.2  0.0 - 0.7 K/uL    Basophils Relative 1  0 - 1 %   Basophils Absolute 0.0  0.0 - 0.1 K/uL  BASIC METABOLIC PANEL      Component Value Range   Sodium 140  135 - 145 mEq/L   Potassium 3.8  3.5 - 5.1 mEq/L   Chloride 107  96 - 112 mEq/L   CO2 23  19 - 32 mEq/L   Glucose, Bld 113 (*) 70 - 99 mg/dL   BUN 31 (*) 6 - 23 mg/dL   Creatinine, Ser 4.09 (*) 0.50 - 1.35 mg/dL   Calcium 9.3  8.4 - 81.1 mg/dL   GFR calc non Af Amer 27 (*) >90 mL/min   GFR calc Af Amer 32 (*) >90 mL/min  PROTIME-INR      Component Value Range   Prothrombin Time 27.3 (*) 11.6 - 15.2 seconds   INR 2.69 (*) 0.00 - 1.49   I personally reviewed the imaging tests through PACS system I reviewed available ER/hospitalization records through the EMR   Lyanne Co, MD 04/29/12 1104

## 2012-04-29 NOTE — ED Notes (Signed)
Patient is alert and oriented x3.  He is complaining of right sided pain that started This morining at 2:30. He denies any history or kidney stones.
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
## 2012-05-01 LAB — URINE CULTURE: Colony Count: >=100000

## 2012-05-02 NOTE — ED Notes (Signed)
+   Urine Patient treated with Keflex-sensitive to same-chart appended per protocol MD. 

## 2012-05-22 ENCOUNTER — Encounter: Payer: Self-pay | Admitting: Neurosurgery

## 2012-05-23 ENCOUNTER — Ambulatory Visit: Payer: Medicare HMO | Admitting: Neurosurgery

## 2012-05-23 ENCOUNTER — Other Ambulatory Visit: Payer: Medicare HMO

## 2012-05-24 ENCOUNTER — Other Ambulatory Visit: Payer: Medicare HMO

## 2012-05-24 ENCOUNTER — Ambulatory Visit: Payer: Medicare HMO | Admitting: Neurosurgery

## 2012-06-05 ENCOUNTER — Encounter: Payer: Self-pay | Admitting: Neurosurgery

## 2012-06-06 ENCOUNTER — Other Ambulatory Visit (INDEPENDENT_AMBULATORY_CARE_PROVIDER_SITE_OTHER): Payer: Medicare HMO | Admitting: *Deleted

## 2012-06-06 ENCOUNTER — Ambulatory Visit (INDEPENDENT_AMBULATORY_CARE_PROVIDER_SITE_OTHER): Payer: Medicare HMO | Admitting: Neurosurgery

## 2012-06-06 ENCOUNTER — Encounter: Payer: Self-pay | Admitting: Neurosurgery

## 2012-06-06 DIAGNOSIS — I6529 Occlusion and stenosis of unspecified carotid artery: Secondary | ICD-10-CM

## 2012-06-06 DIAGNOSIS — Z48812 Encounter for surgical aftercare following surgery on the circulatory system: Secondary | ICD-10-CM

## 2012-06-06 NOTE — Progress Notes (Signed)
[facility] Carotid Office Note  CC: Carotid surveillance Referring Physician: Imogene Burn  History of Present Illness: 79 year old male Luna of Dr. Imogene Burn status post right CEA in 2012. The Luna denies any signs or symptoms of CVA, TIA, amaurosis fugax or any neural deficit. The Luna's daughter denies any new medical diagnoses or recent surgery.  Past Medical History  Diagnosis Date  . Diabetes mellitus   . Hypertension   . Macular degeneration   . Bladder tumor 1998  . Cancer 2001    bladder  . Atrial fibrillation   . Peripheral vascular disease   . Hyperlipidemia   . Anemia   . TIA (transient ischemic attack)   . Low back pain     ROS: [x]  Positive   [ ]  Denies    General: [ ]  Weight loss, [ ]  Fever, [ ]  chills Neurologic: [ ]  Dizziness, [ ]  Blackouts, [ ]  Seizure [ ]  Stroke, [ ]  "Mini stroke", [ ]  Slurred speech, [ ]  Temporary blindness; [ ]  weakness in arms or legs, [ ]  Hoarseness Cardiac: [ ]  Chest pain/pressure, [ ]  Shortness of breath at rest [ ]  Shortness of breath with exertion, [ ]  Atrial fibrillation or irregular heartbeat Vascular: [ ]  Pain in legs with walking, [ ]  Pain in legs at rest, [ ]  Pain in legs at night,  [ ]  Non-healing ulcer, [ ]  Blood clot in vein/DVT,   Pulmonary: [ ]  Home oxygen, [ ]  Productive cough, [ ]  Coughing up blood, [ ]  Asthma,  [ ]  Wheezing Musculoskeletal:  [ ]  Arthritis, [ ]  Low back pain, [ ]  Joint pain Hematologic: [ ]  Easy Bruising, [ ]  Anemia; [ ]  Hepatitis Gastrointestinal: [ ]  Blood in stool, [ ]  Gastroesophageal Reflux/heartburn, [ ]  Trouble swallowing Urinary: [ ]  chronic Kidney disease, [ ]  on HD - [ ]  MWF or [ ]  TTHS, [ ]  Burning with urination, [ ]  Difficulty urinating Skin: [ ]  Rashes, [ ]  Wounds Psychological: [ ]  Anxiety, [ ]  Depression   Social History History  Substance Use Topics  . Smoking status: Former Smoker    Types: Cigarettes    Quit date: 04/12/1939  . Smokeless tobacco: Never  Used  . Alcohol Use: No    Family History Family History  Problem Relation Age of Onset  . Stroke Brother   . Heart disease Brother     Allergies  Allergen Reactions  . Penicillins     Current Outpatient Prescriptions  Medication Sig Dispense Refill  . amLODipine (NORVASC) 10 MG tablet Take 10 mg by mouth daily.      . cephALEXin (KEFLEX) 500 MG capsule Take 1 capsule (500 mg total) by mouth 4 (four) times daily.  40 capsule  0  . cilostazol (PLETAL) 100 MG tablet Take 100 mg by mouth 2 (two) times daily.        . furosemide (LASIX) 40 MG tablet Take 40 mg by mouth daily.      Marland Kitchen glipiZIDE (GLUCOTROL) 10 MG tablet Take 10 mg by mouth daily.        Marland Kitchen lisinopril (PRINIVIL,ZESTRIL) 20 MG tablet Take 20 mg by mouth daily.        . metoprolol succinate (TOPROL-XL) Brent MG 24 hr tablet Take Brent mg by mouth daily.        . Multiple Vitamins-Minerals (PRESERVISION AREDS PO) Take by mouth.        . niacin (NIASPAN) 500 MG CR tablet Take 500 mg by  mouth at bedtime.        . Tamsulosin HCl (FLOMAX) 0.4 MG CAPS Take 0.4 mg by mouth daily.      Marland Kitchen warfarin (COUMADIN) 5 MG tablet Take 5-7.5 mg by mouth daily. Take 1 tablet (5mg ) on Monday, Wednesday , Friday, & Sunday All other days take 1.5 tablets (7.5mg )      . amLODipine (NORVASC) 5 MG tablet Take 5 mg by mouth daily.       Marland Kitchen oxyCODONE-acetaminophen (PERCOCET/ROXICET) 5-325 MG per tablet Take 1 tablet by mouth every 4 (four) hours as needed for pain.  20 tablet  0   No current facility-administered medications for this visit.    Physical Examination  Filed Vitals:   06/06/12 1200  BP: 151/77  Pulse: 90  Resp:     Body mass index is 31.57 kg/(m^2).  General:  WDWN in NAD Gait: Normal HEENT: WNL Eyes: Pupils equal Pulmonary: normal non-labored breathing , without Rales, rhonchi,  wheezing Cardiac: RRR, without  Murmurs, rubs or gallops; Abdomen: soft, NT, no masses Skin: no rashes, ulcers noted  Vascular Exam Pulses: 3+ radial  pulses bilaterally Carotid bruits: Carotid pulses to auscultation no bruits heard Extremities without ischemic changes, no Gangrene , no cellulitis; no open wounds;  Musculoskeletal: no muscle wasting or atrophy   Neurologic: A&O X 3; Appropriate Affect ; SENSATION: normal; MOTOR FUNCTION:  moving all extremities equally. Speech is fluent/normal  Non-Invasive Vascular Imaging CAROTID DUPLEX 06/06/2012  Right ICA 0 - 19% stenosis Left ICA 20 - 39 % stenosis   ASSESSMENT/PLAN: Asymptomatic Luna that will followup in one year with repeat carotid duplex. The Luna's questions were encouraged and answered, he is in agreement with this plan.  Lauree Chandler ANP   Clinic MD: Edilia Bo

## 2012-06-07 NOTE — Addendum Note (Signed)
Addended by: Sharee Pimple on: 06/07/2012 08:35 AM   Modules accepted: Orders

## 2012-09-17 ENCOUNTER — Encounter (HOSPITAL_COMMUNITY): Payer: Self-pay | Admitting: Emergency Medicine

## 2012-09-17 ENCOUNTER — Inpatient Hospital Stay (HOSPITAL_COMMUNITY)
Admission: EM | Admit: 2012-09-17 | Discharge: 2012-09-24 | DRG: 292 | Disposition: A | Discharge status: Skilled Nursing Facility | Payer: Medicare HMO | Attending: Internal Medicine | Admitting: Internal Medicine

## 2012-09-17 ENCOUNTER — Emergency Department (HOSPITAL_COMMUNITY): Payer: Medicare HMO

## 2012-09-17 DIAGNOSIS — Z8673 Personal history of transient ischemic attack (TIA), and cerebral infarction without residual deficits: Secondary | ICD-10-CM

## 2012-09-17 DIAGNOSIS — N039 Chronic nephritic syndrome with unspecified morphologic changes: Secondary | ICD-10-CM | POA: Diagnosis present

## 2012-09-17 DIAGNOSIS — Z8551 Personal history of malignant neoplasm of bladder: Secondary | ICD-10-CM

## 2012-09-17 DIAGNOSIS — I509 Heart failure, unspecified: Secondary | ICD-10-CM

## 2012-09-17 DIAGNOSIS — I129 Hypertensive chronic kidney disease with stage 1 through stage 4 chronic kidney disease, or unspecified chronic kidney disease: Secondary | ICD-10-CM | POA: Diagnosis present

## 2012-09-17 DIAGNOSIS — D518 Other vitamin B12 deficiency anemias: Secondary | ICD-10-CM | POA: Diagnosis present

## 2012-09-17 DIAGNOSIS — D631 Anemia in chronic kidney disease: Secondary | ICD-10-CM | POA: Diagnosis present

## 2012-09-17 DIAGNOSIS — G4700 Insomnia, unspecified: Secondary | ICD-10-CM | POA: Diagnosis present

## 2012-09-17 DIAGNOSIS — I10 Essential (primary) hypertension: Secondary | ICD-10-CM

## 2012-09-17 DIAGNOSIS — Z9889 Other specified postprocedural states: Secondary | ICD-10-CM

## 2012-09-17 DIAGNOSIS — Z87891 Personal history of nicotine dependence: Secondary | ICD-10-CM

## 2012-09-17 DIAGNOSIS — E785 Hyperlipidemia, unspecified: Secondary | ICD-10-CM | POA: Diagnosis present

## 2012-09-17 DIAGNOSIS — N289 Disorder of kidney and ureter, unspecified: Secondary | ICD-10-CM

## 2012-09-17 DIAGNOSIS — N184 Chronic kidney disease, stage 4 (severe): Secondary | ICD-10-CM | POA: Diagnosis present

## 2012-09-17 DIAGNOSIS — D509 Iron deficiency anemia, unspecified: Secondary | ICD-10-CM | POA: Diagnosis present

## 2012-09-17 DIAGNOSIS — I5043 Acute on chronic combined systolic (congestive) and diastolic (congestive) heart failure: Principal | ICD-10-CM

## 2012-09-17 DIAGNOSIS — I739 Peripheral vascular disease, unspecified: Secondary | ICD-10-CM | POA: Diagnosis present

## 2012-09-17 DIAGNOSIS — Z7901 Long term (current) use of anticoagulants: Secondary | ICD-10-CM

## 2012-09-17 DIAGNOSIS — D649 Anemia, unspecified: Secondary | ICD-10-CM

## 2012-09-17 DIAGNOSIS — N179 Acute kidney failure, unspecified: Secondary | ICD-10-CM | POA: Diagnosis not present

## 2012-09-17 DIAGNOSIS — H3530 Unspecified macular degeneration: Secondary | ICD-10-CM | POA: Diagnosis present

## 2012-09-17 DIAGNOSIS — I4891 Unspecified atrial fibrillation: Secondary | ICD-10-CM

## 2012-09-17 DIAGNOSIS — I2510 Atherosclerotic heart disease of native coronary artery without angina pectoris: Secondary | ICD-10-CM | POA: Diagnosis present

## 2012-09-17 DIAGNOSIS — N189 Chronic kidney disease, unspecified: Secondary | ICD-10-CM | POA: Diagnosis present

## 2012-09-17 DIAGNOSIS — Z79899 Other long term (current) drug therapy: Secondary | ICD-10-CM

## 2012-09-17 DIAGNOSIS — E119 Type 2 diabetes mellitus without complications: Secondary | ICD-10-CM | POA: Diagnosis present

## 2012-09-17 DIAGNOSIS — I5031 Acute diastolic (congestive) heart failure: Secondary | ICD-10-CM

## 2012-09-17 LAB — VITAMIN B12: Vitamin B-12: 237 pg/mL (ref 211–911)

## 2012-09-17 LAB — TSH: TSH: 3.502 u[IU]/mL (ref 0.350–4.500)

## 2012-09-17 LAB — CBC WITH DIFFERENTIAL/PLATELET
Basophils Absolute: 0.1 10*3/uL (ref 0.0–0.1)
Basophils Relative: 1 % (ref 0–1)
Eosinophils Absolute: 0.2 10*3/uL (ref 0.0–0.7)
Hemoglobin: 8.3 g/dL — ABNORMAL LOW (ref 13.0–17.0)
MCH: 30.5 pg (ref 26.0–34.0)
MCHC: 32.3 g/dL (ref 30.0–36.0)
Monocytes Relative: 9 % (ref 3–12)
Neutro Abs: 3.9 10*3/uL (ref 1.7–7.7)
Neutrophils Relative %: 65 % (ref 43–77)
Platelets: ADEQUATE 10*3/uL (ref 150–400)

## 2012-09-17 LAB — COMPREHENSIVE METABOLIC PANEL
ALT: 11 U/L (ref 0–53)
AST: 25 U/L (ref 0–37)
Albumin: 3.2 g/dL — ABNORMAL LOW (ref 3.5–5.2)
Alkaline Phosphatase: 137 U/L — ABNORMAL HIGH (ref 39–117)
Chloride: 108 mEq/L (ref 96–112)
Potassium: 5 mEq/L (ref 3.5–5.1)
Sodium: 142 mEq/L (ref 135–145)
Total Bilirubin: 0.4 mg/dL (ref 0.3–1.2)
Total Protein: 8.1 g/dL (ref 6.0–8.3)

## 2012-09-17 LAB — CBC
HCT: 22.4 % — ABNORMAL LOW (ref 39.0–52.0)
Hemoglobin: 7.4 g/dL — ABNORMAL LOW (ref 13.0–17.0)
MCH: 30.6 pg (ref 26.0–34.0)
MCHC: 33 g/dL (ref 30.0–36.0)
MCV: 92.6 fL (ref 78.0–100.0)

## 2012-09-17 LAB — PROTIME-INR: INR: 1.14 (ref 0.00–1.49)

## 2012-09-17 LAB — PRO B NATRIURETIC PEPTIDE: Pro B Natriuretic peptide (BNP): 2569 pg/mL — ABNORMAL HIGH (ref 0–450)

## 2012-09-17 LAB — APTT: aPTT: 32 seconds (ref 24–37)

## 2012-09-17 LAB — BASIC METABOLIC PANEL
BUN: 38 mg/dL — ABNORMAL HIGH (ref 6–23)
CO2: 22 mEq/L (ref 19–32)
CO2: 24 mEq/L (ref 19–32)
Calcium: 9.1 mg/dL (ref 8.4–10.5)
Calcium: 8.5 mg/dL (ref 8.4–10.5)
Creatinine, Ser: 2.1 mg/dL — ABNORMAL HIGH (ref 0.50–1.35)
GFR calc non Af Amer: 29 mL/min — ABNORMAL LOW (ref >90)
GFR calc non Af Amer: 26 mL/min — ABNORMAL LOW (ref >90)
Glucose, Bld: 106 mg/dL — ABNORMAL HIGH (ref 70–99)
Sodium: 141 mEq/L (ref 135–145)

## 2012-09-17 LAB — FERRITIN
Ferritin: 27 ng/mL (ref 22–322)
Ferritin: 24 ng/mL (ref 22–322)

## 2012-09-17 LAB — MAGNESIUM: Magnesium: 2.3 mg/dL (ref 1.5–2.5)

## 2012-09-17 LAB — TROPONIN I
Troponin I: <0.3 ng/mL (ref <0.30)
Troponin I: <0.3 ng/mL (ref <0.30)

## 2012-09-17 LAB — POCT I-STAT TROPONIN I

## 2012-09-17 LAB — IRON AND TIBC
Iron: 63 ug/dL (ref 42–135)
UIBC: 309 ug/dL (ref 125–400)

## 2012-09-17 MED ORDER — SODIUM CHLORIDE 0.9 % IJ SOLN: 3.0000 mL | INTRAMUSCULAR | Status: DC | PRN

## 2012-09-17 MED ORDER — LEVALBUTEROL HCL 0.63 MG/3ML IN NEBU
0.6300 mg | INHALATION_SOLUTION | Freq: Four times a day (QID) | RESPIRATORY_TRACT | Status: DC | PRN
  Filled 2012-09-17: qty 3

## 2012-09-17 MED ORDER — NIACIN ER (ANTIHYPERLIPIDEMIC) 500 MG PO TBCR
500.0000 mg | EXTENDED_RELEASE_TABLET | Freq: Every day | ORAL | Status: DC
  Administered 2012-09-17 – 2012-09-23 (×7): 500 mg via ORAL
  Filled 2012-09-17 (×8): qty 1

## 2012-09-17 MED ORDER — ONDANSETRON HCL 4 MG/2ML IJ SOLN
4.0000 mg | Freq: Four times a day (QID) | INTRAMUSCULAR | Status: DC | PRN
  Administered 2012-09-20: 4 mg via INTRAVENOUS
  Filled 2012-09-17: qty 2

## 2012-09-17 MED ORDER — ASPIRIN EC 81 MG PO TBEC
81.0000 mg | DELAYED_RELEASE_TABLET | Freq: Every day | ORAL | Status: DC
  Administered 2012-09-17 – 2012-09-24 (×8): 81 mg via ORAL
  Filled 2012-09-17 (×8): qty 1

## 2012-09-17 MED ORDER — HEPARIN SODIUM (PORCINE) 5000 UNIT/ML IJ SOLN
5000.0000 [IU] | Freq: Three times a day (TID) | INTRAMUSCULAR | Status: DC
  Administered 2012-09-17 – 2012-09-24 (×22): 5000 [IU] via SUBCUTANEOUS
  Filled 2012-09-17 (×25): qty 1

## 2012-09-17 MED ORDER — METOPROLOL SUCCINATE ER 25 MG PO TB24
25.0000 mg | ORAL_TABLET | Freq: Every day | ORAL | Status: DC
  Administered 2012-09-17 – 2012-09-24 (×7): 25 mg via ORAL
  Filled 2012-09-17 (×8): qty 1

## 2012-09-17 MED ORDER — CILOSTAZOL 100 MG PO TABS
100.0000 mg | ORAL_TABLET | Freq: Two times a day (BID) | ORAL | Status: DC
  Administered 2012-09-17 – 2012-09-24 (×15): 100 mg via ORAL
  Filled 2012-09-17 (×16): qty 1

## 2012-09-17 MED ORDER — LISINOPRIL 20 MG PO TABS
20.0000 mg | ORAL_TABLET | Freq: Every day | ORAL | Status: DC
  Administered 2012-09-17: 20 mg via ORAL
  Filled 2012-09-17: qty 1

## 2012-09-17 MED ORDER — SODIUM CHLORIDE 0.9 % IV SOLN: 250.0000 mL | INTRAVENOUS | Status: DC | PRN

## 2012-09-17 MED ORDER — AMLODIPINE BESYLATE 5 MG PO TABS
5.0000 mg | ORAL_TABLET | Freq: Every day | ORAL | Status: DC
  Administered 2012-09-17 – 2012-09-20 (×4): 5 mg via ORAL
  Filled 2012-09-17 (×5): qty 1

## 2012-09-17 MED ORDER — FUROSEMIDE 10 MG/ML IJ SOLN
80.0000 mg | Freq: Two times a day (BID) | INTRAMUSCULAR | Status: DC
  Administered 2012-09-17 – 2012-09-18 (×4): 80 mg via INTRAVENOUS
  Filled 2012-09-17 (×4): qty 8

## 2012-09-17 MED ORDER — FUROSEMIDE 10 MG/ML IJ SOLN
40.0000 mg | Freq: Once | INTRAMUSCULAR | Status: AC
  Administered 2012-09-17: 40 mg via INTRAVENOUS
  Filled 2012-09-17: qty 4

## 2012-09-17 MED ORDER — TAMSULOSIN HCL 0.4 MG PO CAPS
0.4000 mg | ORAL_CAPSULE | Freq: Every day | ORAL | Status: DC
  Administered 2012-09-17 – 2012-09-24 (×8): 0.4 mg via ORAL
  Filled 2012-09-17 (×9): qty 1

## 2012-09-17 MED ORDER — ACETAMINOPHEN 325 MG PO TABS: 650.0000 mg | ORAL_TABLET | ORAL | Status: DC | PRN

## 2012-09-17 MED ORDER — SODIUM CHLORIDE 0.9 % IJ SOLN
3.0000 mL | Freq: Two times a day (BID) | INTRAMUSCULAR | Status: DC
  Administered 2012-09-17 – 2012-09-24 (×15): 3 mL via INTRAVENOUS

## 2012-09-17 NOTE — ED Notes (Signed)
Per EMS: Pt from home with c/o SOB x 1 week. Pt seen for same by PCP last Monday who increased fluid pill (unsure of rx or dosage). Pt reports productive cough x 1 week. On arrival, 94% on RA. Placed on 2L for comfort. Pt denies pain. AO x4. 80 A fib, 160/90. Hx: CHF, HTN, A fib
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
## 2012-09-17 NOTE — Progress Notes (Signed)
Contacted [facility] Liaison to determine if patient is active with a [employer] disease management program.  She was not immediately aware if he was on services.  She will determine and follow up with writer and the inpatient CM team as needed.  Will determine the need for [facility] services after [facility] follow up and patient's progress.  Of note, [facility] Care Management services does not replace or interfere with any services that are arranged by inpatient case management or social work.  For additional questions or referrals please contact Anibal Henderson BSN RN [facility] Liaison at [phone].

## 2012-09-17 NOTE — ED Notes (Signed)
Cardiology at bedside.

## 2012-09-17 NOTE — Care Management Note (Unsigned)
    Page 1 of 2   09/17/2012     12:47:50 PM   CARE MANAGEMENT NOTE 09/17/2012  Patient:  [name], [name]   Account Number:  [id number]  Date Initiated:  09/17/2012  Documentation initiated by:  Tera Mater  Subjective/Objective Assessment:   79yo male admitted with CHF.  Pt. lives at home with son, Corliss Marcus, in [place].     Action/Plan:   discharge planning   Anticipated DC Date:  09/20/2012   Anticipated DC Plan:  HOME W HOME HEALTH SERVICES      DC Planning Services  CM consult      [facility] Choice  HOME HEALTH  DURABLE MEDICAL EQUIPMENT   Choice offered to / List presented to:     DME arranged  HOSPITAL BED      DME agency  [facility]        Status of service:  In process, will continue to follow Medicare Important Message given?   (If response is "NO", the following Medicare IM given date fields will be blank) Date Medicare IM given:   Date Additional Medicare IM given:    Discharge Disposition:    Per UR Regulation:  Reviewed for med. necessity/level of care/duration of stay  If discussed at Long Length of Stay Meetings, dates discussed:    Comments:  Contact:  Camryn Quesinberry (son) [phone] (cell)                                               [phone] (home)                 [name] (dtr)      [phone] (home)    09/17/12 1215 In to complete Heart Failure home health screen with pt. and daughter, Selena Batten.  Pt. resting with eyes closed. Daughter had questions regarding home health, DME, and aid in attendence.  This NCM gave pt. list of home health agencies to chose from for home health, as well as a private duty list for private duty.  Explained to Selena Batten that her brother, Corliss Marcus, would have to call the [facility] to inquire about an aide in attendence.  In addition, the pt. or daughter would have to ask the physician for a RX for a lift chair.  Pt. would qualify for a hospital bed, and this NCM will obtain order.  NCM will continue to follow pt. for dc  needs. Tera Mater, RN, BSN NCM [phone]

## 2012-09-17 NOTE — Progress Notes (Addendum)
SUBJECTIVE:  Feels better OBJECTIVE:   Vitals:   Filed Vitals:   09/17/12 0430 09/17/12 0529 09/17/12 0647 09/17/12 0916  BP: 136/66 143/57 177/90 157/72  Pulse: 74 81 90 94  Temp: 98.4 F (36.9 C)  97.7 F (36.5 C) 98.2 F (36.8 C)  TempSrc: Oral  Oral Oral  Resp: 21 36 22 20  Height:   5\' 9"  (1.753 m)   Weight:   107.5 kg (236 lb 15.9 oz)   SpO2: 94% 95% 97% 98%   I&O's:   Intake/Output Summary (Last 24 hours) at 09/17/12 1034 Last data filed at 09/17/12 0855  Gross per 24 hour  Intake    600 ml  Output    300 ml  Net    300 ml   TELEMETRY: Reviewed telemetry pt in atrial fibrillation     PHYSICAL EXAM General: Well developed, well nourished, in no acute distress Head: Eyes PERRLA, No xanthomas.   Normal cephalic and atramatic  Lungs:   Crackles at bases. Heart: irregrlarly irregular S1 S2 Pulses are 2+ & equal. Abdomen: Bowel sounds are positive, abdomen soft and non-tender without masses Extremities:   No clubbing, cyanosis or edema.  DP +1 Neuro: Alert and oriented X 3. Psych:  Good affect, responds appropriately   LABS: Basic Metabolic Panel:  Recent Labs  16/10/96 0252 09/17/12 0818  NA 140 142  K 4.1 5.0  CL 110 108  CO2 22 22  GLUCOSE 106* 107*  BUN 38* 38*  CREATININE 1.91* 1.97*  CALCIUM 9.1 9.1  MG  --  2.3   Liver Function Tests:  Recent Labs  09/17/12 0818  AST 25  ALT 11  ALKPHOS 137*  BILITOT 0.4  PROT 8.1  ALBUMIN 3.2*   No results found for this basename: LIPASE, AMYLASE,  in the last 72 hours CBC:  Recent Labs  09/17/12 0252 09/17/12 0818  WBC 5.4 6.0  NEUTROABS  --  3.9  HGB 7.4* 8.3*  HCT 22.4* 25.7*  MCV 92.6 94.5  PLT 256 PLATELET CLUMPS NOTED ON SMEAR, COUNT APPEARS ADEQUATE   Cardiac Enzymes:  Recent Labs  09/17/12 0819  TROPONINI <0.30   Coag Panel:   Lab Results  Component Value Date   INR 1.16 09/17/2012   INR 1.14 09/17/2012   INR 2.69* 04/29/2012    RADIOLOGY: Dg Chest Port 1 View  09/17/2012    *RADIOLOGY REPORT*  Clinical Data: Shortness of breath and congestion tonight.  PORTABLE CHEST - 1 VIEW  Comparison: 09/10/2012  Findings: Cardiac enlargement with pulmonary vascular congestion and this p.c. interstitial and alveolar edema.  Small bilateral pleural effusions.  No pneumothorax.  Tortuous aorta.  Stable appearance since previous study.  IMPRESSION: Cardiac enlargement with pulmonary vascular congestion, edema, and small bilateral pleural effusions.   Original Report Authenticated By: Burman Nieves, M.D.      ASSESSMENT:  1.  Acute on chronic diastolic CHF - echo in office recently with EF 51%.  He has mild AS which should not be contributing to his CHF.  I suspect his CHF is multifactorial due to worsening renal function, significant anemia and dietary indiscretion. 2.  Low normal LVF EF 51% by echo  3.  Mild AS 4.  Anemia 5.  Acute on chronic renal insufficiency 6.  DM 7.  HTN 8.  Atrial fibrillation - not on anticoagulation due to anemia/advanced age  PLAN:   1.  Continue IV diuretics 2.  Will ask Dr. Valentina Lucks to take over noncardiac issues  including management of DM, renal failure and anemia. 3.  Hold ACE I due to renal insuff 4.  Continue beta blocker 5.  Add amlodipine for BP control 6.  Heme check stools 7.  Check B12, folate, ferritin, iron panel Quintella Reichert, MD  09/17/2012  10:34 AM

## 2012-09-17 NOTE — ED Notes (Signed)
Radiology at bedside for [facility]. Pt ID confirmed.

## 2012-09-17 NOTE — Progress Notes (Signed)
Pt. Arrived from ED.  Pt. Currently pain free and comfortable.  Explained to patient how to use call light and phone.  Bed alarm turned on.  Will continue to monitor patient.
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
## 2012-09-17 NOTE — Progress Notes (Signed)
09/17/12 1520 TC to the [facility] [phone], ext [phone]) to give notification of admission. Tera Mater, RN, BSN NCM [phone]

## 2012-09-17 NOTE — Progress Notes (Signed)
RT called to give patient a PRN breathing treatment.  Patient in bed resting comfortably.  RR 16 BBS are clear with good air movement.  RT will continue to monitor.

## 2012-09-17 NOTE — Progress Notes (Signed)
Patient's daughter @ bedside.  Stated her father has advanced macular degeneration and cannot see TV and only sees shadows.  Soft Touch call bell requested per limited vision.  Pt received 80mg  Lasix @ 8am.  Has diuresed 1200 cc.  Hgb 7.4 and renal function elevated.  Dr. Mayford Knife in and discussed findings with daughter and stated she would be transitioning pt to internist for hospital stay and she would be following as cardiology consult.

## 2012-09-18 LAB — GLUCOSE, CAPILLARY
Glucose-Capillary: 136 mg/dL — ABNORMAL HIGH (ref 70–99)
Glucose-Capillary: 148 mg/dL — ABNORMAL HIGH (ref 70–99)

## 2012-09-18 LAB — BASIC METABOLIC PANEL
CO2: 27 mEq/L (ref 19–32)
Chloride: 103 mEq/L (ref 96–112)
Creatinine, Ser: 2.28 mg/dL — ABNORMAL HIGH (ref 0.50–1.35)
Glucose, Bld: 109 mg/dL — ABNORMAL HIGH (ref 70–99)
Sodium: 139 mEq/L (ref 135–145)

## 2012-09-18 LAB — HEMOGLOBIN A1C
Hgb A1c MFr Bld: 6.1 % — ABNORMAL HIGH (ref <5.7)
Mean Plasma Glucose: 128 mg/dL — ABNORMAL HIGH (ref <117)

## 2012-09-18 MED ORDER — CYANOCOBALAMIN 1000 MCG/ML IJ SOLN
1000.0000 ug | Freq: Every day | INTRAMUSCULAR | Status: AC
  Administered 2012-09-18 – 2012-09-22 (×5): 1000 ug via INTRAMUSCULAR
  Filled 2012-09-18 (×5): qty 1

## 2012-09-18 MED ORDER — CYANOCOBALAMIN 1000 MCG/ML IJ SOLN
1000.0000 ug | Freq: Every day | INTRAMUSCULAR | Status: DC
  Filled 2012-09-18: qty 1

## 2012-09-18 MED ORDER — FUROSEMIDE 10 MG/ML IJ SOLN
80.0000 mg | Freq: Every day | INTRAMUSCULAR | Status: DC
  Administered 2012-09-19: 80 mg via INTRAVENOUS
  Filled 2012-09-18: qty 8

## 2012-09-18 MED ORDER — FERROUS SULFATE 325 (65 FE) MG PO TABS
325.0000 mg | ORAL_TABLET | Freq: Three times a day (TID) | ORAL | Status: DC
  Administered 2012-09-18 – 2012-09-24 (×19): 325 mg via ORAL
  Filled 2012-09-18 (×23): qty 1

## 2012-09-18 NOTE — Progress Notes (Signed)
Pt stated feet are feeling numb and have been for the past year. Would like to discuss with MD.

## 2012-09-18 NOTE — Progress Notes (Signed)
SUBJECTIVE:  No compliants  OBJECTIVE:   Vitals:   Filed Vitals:   09/18/12 1100 09/18/12 1300 09/18/12 1700 09/18/12 2204  BP: 131/63 139/70 132/60 129/60  Pulse: 80 78 88 68  Temp:  98 F (36.7 C)  98.8 F (37.1 C)  TempSrc:  Oral  Oral  Resp: 20 20 18 18   Height:      Weight:      SpO2: 98% 98% 96% 94%   I&O's:   Intake/Output Summary (Last 24 hours) at 09/18/12 2218 Last data filed at 09/18/12 1900  Gross per 24 hour  Intake    720 ml  Output   4650 ml  Net  -3930 ml   TELEMETRY: Reviewed telemetry pt in atrial fibrillation     PHYSICAL EXAM General: Well developed, well nourished, in no acute distress Head: Eyes PERRLA, No xanthomas.   Normal cephalic and atramatic  Lungs:   Few crackles at bases Heart:   Irregularly irregular S1 S2 Pulses are 2+ & equal. Abdomen: Bowel sounds are positive, abdomen soft and non-tender without masse Extremities:   No clubbing, cyanosis or edema.  DP +1 Neuro: Alert and oriented X 3. Psych:  Good affect, responds appropriately   LABS: Basic Metabolic Panel:  Recent Labs  16/10/96 0252 09/17/12 0818 09/17/12 1616 09/18/12 0555  NA 140 142 141 139  K 4.1 5.0 4.2 4.5  CL 110 108 107 103  CO2 22 22 24 27   GLUCOSE 106* 107* 162* 109*  BUN 38* 38* 40* 45*  CREATININE 1.91* 1.97* 2.10* 2.28*  CALCIUM 9.1 9.1 8.5 8.9  MG  --  2.3  --   --    Liver Function Tests:  Recent Labs  09/17/12 0818  AST 25  ALT 11  ALKPHOS 137*  BILITOT 0.4  PROT 8.1  ALBUMIN 3.2*   No results found for this basename: LIPASE, AMYLASE,  in the last 72 hours CBC:  Recent Labs  09/17/12 0252 09/17/12 0818  WBC 5.4 6.0  NEUTROABS  --  3.9  HGB 7.4* 8.3*  HCT 22.4* 25.7*  MCV 92.6 94.5  PLT 256 PLATELET CLUMPS NOTED ON SMEAR, COUNT APPEARS ADEQUATE   Cardiac Enzymes:  Recent Labs  09/17/12 0819 09/17/12 1217  TROPONINI <0.30 <0.30   BNP: No components found with this basename: POCBNP,  D-Dimer: No results found for this  basename: DDIMER,  in the last 72 hours Hemoglobin A1C:  Recent Labs  09/18/12 0949  HGBA1C 6.1*   Fasting Lipid Panel: No results found for this basename: CHOL, HDL, LDLCALC, TRIG, CHOLHDL, LDLDIRECT,  in the last 72 hours Thyroid Function Tests:  Recent Labs  09/17/12 0818  TSH 3.502   Anemia Panel:  Recent Labs  09/17/12 1616  VITAMINB12 237  FERRITIN 24  TIBC 360  IRON 51   Coag Panel:   Lab Results  Component Value Date   INR 1.16 09/17/2012   INR 1.14 09/17/2012   INR 2.69* 04/29/2012    RADIOLOGY: Dg Chest Port 1 View  09/17/2012   *RADIOLOGY REPORT*  Clinical Data: Shortness of breath and congestion tonight.  PORTABLE CHEST - 1 VIEW  Comparison: 09/10/2012  Findings: Cardiac enlargement with pulmonary vascular congestion and this p.c. interstitial and alveolar edema.  Small bilateral pleural effusions.  No pneumothorax.  Tortuous aorta.  Stable appearance since previous study.  IMPRESSION: Cardiac enlargement with pulmonary vascular congestion, edema, and small bilateral pleural effusions.   Original Report Authenticated By: Burman Nieves, M.D.  ASSESSMENT:  1. Acute on chronic diastolic CHF - echo in office recently with EF 51%. He has mild AS which should not be contributing to his CHF. I suspect his CHF is multifactorial due to worsening renal function, significant anemia and dietary indiscretion. He is 6L negative since admit 2. Low normal LVF EF 51% by echo  3. Mild AS  4. Anemia  5. Acute on chronic renal insufficiency  6. DM  7. HTN - improved after adding amlodipine 8. Atrial fibrillation - not on anticoagulation due to anemia/advanced age  - rate controlled PLAN:  1. Decrease Lasix to 80mg  IV daily 2. Hold ACE I due to renal insuff  3. Continue beta blocker/amlodipine    Quintella Reichert, MD  09/18/2012  10:18 PM

## 2012-09-18 NOTE — Plan of Care (Signed)
Problem: Phase I Progression Outcomes Goal: EF % per last Echo/documented,Core Reminder form on chart Outcome: Completed/Met Date Met:  09/18/12 51% per MD note

## 2012-09-18 NOTE — Evaluation (Signed)
Physical Therapy Evaluation Patient Details Name: [name] MRN: [id number] DOB: 1917-05-25 Today's Date: 09/18/2012 Time: 4098-1191 PT Time Calculation (min): 27 min  PT Assessment / Plan / Recommendation Clinical Impression  pt admitted with SOB due to CHF.  Initially on 2 L O2 [place] sats at 98%,.  During gait on RA sats dropped to 89-91%  with minimal dyspnea.  Pt may need ST SNF if family can't provide enough assist as he is having alot of trouble with bed mobility and transfers.    PT Assessment  Patient needs continued PT services    Follow Up Recommendations  SNF;Other (comment) (or HHPT if family can provide sufficient assist in the S-T.)    Does the patient have the potential to tolerate intense rehabilitation      Barriers to Discharge Decreased caregiver support      Equipment Recommendations  None recommended by PT    Recommendations for Other Services     Frequency Min 3X/week    Precautions / Restrictions Precautions Precautions: Fall   Pertinent Vitals/Pain       Mobility  Bed Mobility Bed Mobility: Left Sidelying to Sit;Rolling Left;Sitting - Scoot to Edge of Bed;Sit to Supine Rolling Left: 4: Min assist Left Sidelying to Sit: 4: Min assist Sitting - Scoot to Edge of Bed: 4: Min guard Sit to Supine: 4: Min assist Details for Bed Mobility Assistance: vc's for better technique Transfers Transfers: Sit to Stand;Stand to Sit Sit to Stand: 4: Min assist;From bed;With upper extremity assist Stand to Sit: 4: Min assist;To bed;With upper extremity assist Details for Transfer Assistance: vc's for hand placement, assist to come forward Ambulation/Gait Ambulation/Gait Assistance: 4: Min assist Ambulation Distance (Feet): 50 Feet Assistive device: Rolling walker Ambulation/Gait Assistance Details: weak kneed esp. with L painful knee.  Generally mildly unsteady Gait Pattern: Step-through pattern Gait velocity: slower Stairs: No Wheelchair  Mobility Wheelchair Mobility: No    Exercises     PT Diagnosis: Difficulty walking;Generalized weakness  PT Problem List: Decreased strength;Decreased activity tolerance;Decreased balance;Decreased mobility;Decreased knowledge of use of DME PT Treatment Interventions: DME instruction;Gait training;Stair training;Functional mobility training;Therapeutic activities;Balance training;Patient/family education   PT Goals Acute Rehab PT Goals PT Goal Formulation: With patient Time For Goal Achievement: 10/02/12 Potential to Achieve Goals: Good Pt will go Supine/Side to Sit: with modified independence;with HOB 0 degrees PT Goal: Supine/Side to Sit - Progress: Goal set today Pt will go Sit to Stand: with modified independence PT Goal: Sit to Stand - Progress: Goal set today Pt will Transfer Bed to Chair/Chair to Bed: with modified independence PT Transfer Goal: Bed to Chair/Chair to Bed - Progress: Goal set today Pt will Ambulate: 16 - 50 feet;with modified independence;with least restrictive assistive device;with rolling walker PT Goal: Ambulate - Progress: Goal set today Pt will Go Up / Down Stairs: 6-9 stairs;with supervision;with least restrictive assistive device;with rail(s) PT Goal: Up/Down Stairs - Progress: Goal set today  Visit Information  Last PT Received On: 09/18/12 Assistance Needed: +1    Subjective Data  Patient Stated Goal: Home when I'm able   Prior Functioning  Home Living Lives With: Son Available Help at Discharge: Available PRN/intermittently;Other (Comment) (son works, dtr works) Type of Home: [employer] Access: Stairs to enter Secretary/administrator of Steps: 8 Entrance Stairs-Rails: Right;Left Home Layout: One level Bathroom Shower/Tub: Engineer, manufacturing systems: Standard Home Adaptive Equipment: Straight cane;Walker - rolling Prior Function Level of Independence: Independent with assistive device(s) Able to Take Stairs?: Yes Driving:  No  Communication Communication: No difficulties    Cognition  Cognition Behavior During Therapy: WFL for tasks assessed/performed Overall Cognitive Status: Within Functional Limits for tasks assessed (slow to process)    Extremity/Trunk Assessment Right Upper Extremity Assessment RUE ROM/Strength/Tone: [facility] for tasks assessed Left Upper Extremity Assessment LUE ROM/Strength/Tone: WFL for tasks assessed Right Lower Extremity Assessment RLE ROM/Strength/Tone: WFL for tasks assessed (bil LE weakness, left weaker than R) Left Lower Extremity Assessment LLE ROM/Strength/Tone: WFL for tasks assessed Trunk Assessment Trunk Assessment: Normal   Balance Static Sitting Balance Static Sitting - Balance Support: Feet supported;No upper extremity supported Static Sitting - Level of Assistance: 5: Stand by assistance Static Standing Balance Static Standing - Balance Support: During functional activity;Bilateral upper extremity supported Static Standing - Level of Assistance: Other (comment) (min guard)  End of Session PT - End of Session Activity Tolerance: Patient limited by fatigue Patient left: in bed;with call bell/phone within reach;with family/visitor present Nurse Communication: Mobility status  GP     Jacora Hopkins, Eliseo Gum 09/18/2012, 5:39 PM 09/18/2012  [place], PT [phone]  (pager)

## 2012-09-18 NOTE — Progress Notes (Signed)
Utilization Review Completed.   Infinity Jeffords, RN, BSN Nurse Case Manager  [phone]  

## 2012-09-18 NOTE — Progress Notes (Signed)
Subjective: No complaints  Objective: Vital signs in last 24 hours: Temp:  [98.1 F (36.7 C)-99.1 F (37.3 C)] 98.1 F (36.7 C) (06/10 0648) Pulse Rate:  [76-94] 81 (06/10 0648) Resp:  [18-20] 20 (06/10 0648) BP: (133-157)/(52-72) 141/63 mmHg (06/10 0648) SpO2:  [95 %-100 %] 99 % (06/10 0648) Weight:  [106.142 kg (234 lb)] 106.142 kg (234 lb) (06/10 0649) Weight change: -1.358 kg (-2 lb 15.9 oz) Last BM Date: 09/17/12  Intake/Output from previous day: 06/09 0701 - 06/10 0700 In: 1560 [P.O.:1560] Out: 6100 [Urine:6100] Intake/Output this shift:    General appearance: alert and cooperative Resp: wheezes bilaterally Cardio: irregularly irregular rhythm Extremities: edema trace  Lab Results:  Recent Labs  09/17/12 0252 09/17/12 0818  WBC 5.4 6.0  HGB 7.4* 8.3*  HCT 22.4* 25.7*  PLT 256 PLATELET CLUMPS NOTED ON SMEAR, COUNT APPEARS ADEQUATE   BMET  Recent Labs  09/17/12 1616 09/18/12 0555  NA 141 139  K 4.2 4.5  CL 107 103  CO2 24 27  GLUCOSE 162* 109*  BUN 40* 45*  CREATININE 2.10* 2.28*  CALCIUM 8.5 8.9    Studies/Results: Dg Chest Port 1 View  09/17/2012   *RADIOLOGY REPORT*  Clinical Data: Shortness of breath and congestion tonight.  PORTABLE CHEST - 1 VIEW  Comparison: 09/10/2012  Findings: Cardiac enlargement with pulmonary vascular congestion and this p.c. interstitial and alveolar edema.  Small bilateral pleural effusions.  No pneumothorax.  Tortuous aorta.  Stable appearance since previous study.  IMPRESSION: Cardiac enlargement with pulmonary vascular congestion, edema, and small bilateral pleural effusions.   Original Report Authenticated By: Burman Nieves, M.D.    Medications: I have reviewed the patient's current medications.  Assessment/Plan: 1. Acute on chronic diastolic CHF - per cardiology  2. Low normal LVF EF 51% by echo  3. Mild AS  4. Anemia, likely multifactorial secondary to renal failure, iron deficiency and b12 defficiency.   Start on B12 and iron, consider aranesp  5. Acute on chronic renal insufficiency apprears to be at about baseline compared to 1/14 6. DM check CBGs BID and check A1C.Marland Kitchen  Glipizide held, not sure he was taking it. 7. HTN amlodipine added 8. Atrial fibrillation - not on anticoagulation due to anemia/advanced age  105. PT consult     LOS: 1 day   [name] JOSEPH 09/18/2012, 8:02 AM

## 2012-09-18 NOTE — Progress Notes (Signed)
Drop Pt to 1L O2. Will monitor stat levels

## 2012-09-18 NOTE — Progress Notes (Signed)
Continus O2 monitor ordered for PT per order. Pt on 2L. Pt family would like to inquire at rehab facility for PT at d/c

## 2012-09-19 LAB — CBC
MCHC: 33.1 g/dL (ref 30.0–36.0)
Platelets: 219 10*3/uL (ref 150–400)
RDW: 19.1 % — ABNORMAL HIGH (ref 11.5–15.5)

## 2012-09-19 LAB — GLUCOSE, CAPILLARY
Glucose-Capillary: 146 mg/dL — ABNORMAL HIGH (ref 70–99)
Glucose-Capillary: 164 mg/dL — ABNORMAL HIGH (ref 70–99)
Glucose-Capillary: 185 mg/dL — ABNORMAL HIGH (ref 70–99)

## 2012-09-19 LAB — BASIC METABOLIC PANEL
BUN: 53 mg/dL — ABNORMAL HIGH (ref 6–23)
Creatinine, Ser: 2.45 mg/dL — ABNORMAL HIGH (ref 0.50–1.35)
GFR calc Af Amer: 25 mL/min — ABNORMAL LOW (ref >90)
GFR calc non Af Amer: 21 mL/min — ABNORMAL LOW (ref >90)

## 2012-09-19 LAB — OCCULT BLOOD X 1 CARD TO LAB, STOOL: Fecal Occult Bld: NEGATIVE

## 2012-09-19 MED ORDER — DARBEPOETIN ALFA-POLYSORBATE 40 MCG/0.4ML IJ SOLN
40.0000 ug | INTRAMUSCULAR | Status: DC
  Administered 2012-09-19: 40 ug via SUBCUTANEOUS
  Filled 2012-09-19 (×2): qty 0.4

## 2012-09-19 MED ORDER — SENNOSIDES-DOCUSATE SODIUM 8.6-50 MG PO TABS
2.0000 | ORAL_TABLET | Freq: Every evening | ORAL | Status: DC | PRN
  Administered 2012-09-19: 2 via ORAL
  Filled 2012-09-19 (×3): qty 2

## 2012-09-19 NOTE — Progress Notes (Signed)
Subjective: No complaints  Objective: Vital signs in last 24 hours: Temp:  [98 F (36.7 C)-100.5 F (38.1 C)] 100.5 F (38.1 C) (06/11 0405) Pulse Rate:  [68-88] 83 (06/11 0405) Resp:  [18-20] 18 (06/11 0405) BP: (129-141)/(53-70) 134/53 mmHg (06/11 0405) SpO2:  [94 %-99 %] 96 % (06/11 0405) Weight:  [100.7 kg (222 lb 0.1 oz)-106.142 kg (234 lb)] 100.7 kg (222 lb 0.1 oz) (06/11 0405) Weight change: -5.442 kg (-12 lb) Last BM Date: 09/17/12  Intake/Output from previous day: 06/10 0701 - 06/11 0700 In: 720 [P.O.:720] Out: 2875 [Urine:2875] Intake/Output this shift: Total I/O In: -  Out: 625 [Urine:625]  General appearance: alert and cooperative Resp: clear to auscultation bilaterally Cardio: irregularly irregular rhythm Extremities: extremities normal, atraumatic, no cyanosis or edema  Lab Results:  Recent Labs  09/17/12 0252 09/17/12 0818  WBC 5.4 6.0  HGB 7.4* 8.3*  HCT 22.4* 25.7*  PLT 256 PLATELET CLUMPS NOTED ON SMEAR, COUNT APPEARS ADEQUATE   BMET  Recent Labs  09/17/12 1616 09/18/12 0555  NA 141 139  K 4.2 4.5  CL 107 103  CO2 24 27  GLUCOSE 162* 109*  BUN 40* 45*  CREATININE 2.10* 2.28*  CALCIUM 8.5 8.9    Studies/Results: No results found.  Medications: I have reviewed the patient's current medications.  Assessment/Plan: 1. Acute on chronic diastolic CHF - per cardiology  2. Low normal LVF EF 51% by echo  3. Mild AS  4. Anemia, likely multifactorial secondary to renal failure, iron deficiency and b12 defficiency. Start on B12 and iron, Aranesp injection today 5. Acute on chronic renal insufficiency apprears to be at about baseline compared to 1/14  6. DM check CBGs BID.  AIC only 6.1, follow CBC, no meds 7. HTN amlodipine added, blood pressure acceptable 8. Atrial fibrillation - not on anticoagulation due to anemia/advanced age, rate controlled on metoprolol 9.Disposition [facility] vs home with home health depending on family ability to  provide care   LOS: 2 days   [facility] 09/19/2012, 5:43 AM

## 2012-09-19 NOTE — Progress Notes (Signed)
Pt began to choke on fruit w/ lunch. Pt daughter called for help. Pt coughed up food. Repositioned Pt more up right to eat. Daughter at bed side will continue to monitor

## 2012-09-19 NOTE — Progress Notes (Signed)
Some urine missed d/t condom cath coming off.

## 2012-09-19 NOTE — Progress Notes (Signed)
SUBJECTIVE:  No complaints  OBJECTIVE:   Vitals:   Filed Vitals:   09/18/12 1700 09/18/12 2204 09/19/12 0405 09/19/12 0928  BP: 132/60 129/60 134/53 134/53  Pulse: 88 68 83 78  Temp:  98.8 F (37.1 C) 100.5 F (38.1 C) 99.4 F (37.4 C)  TempSrc:  Oral Oral Oral  Resp: 18 18 18 19   Height:      Weight:   100.7 kg (222 lb 0.1 oz)   SpO2: 96% 94% 96% 96%   I&O's:   Intake/Output Summary (Last 24 hours) at 09/19/12 1337 Last data filed at 09/19/12 0900  Gross per 24 hour  Intake    480 ml  Output   2375 ml  Net  -1895 ml   TELEMETRY: Reviewed telemetry pt in AFib:     PHYSICAL EXAM General: Well developed, well nourished, in no acute distress, lying flat without difficulty Head: Eyes PERRLA, No xanthomas.   Normal cephalic and atramatic  Lungs:   Clear bilaterally to auscultation and percussion. Heart:  Irregularly irregular, S1 S2, normal rate, 3/6 systolic murmur  Abdomen:  abdomen soft and non-tender Msk:  Back normal, normal gait. Normal strength and tone for age. Extremities:  No edema.   Neuro: Alert  Psych:  Normal affect, responds appropriately   LABS: Basic Metabolic Panel:  Recent Labs  16/10/96 0252 09/17/12 0818  09/18/12 0555 09/19/12 0350  NA 140 142  < > 139 137  K 4.1 5.0  < > 4.5 4.0  CL 110 108  < > 103 97  CO2 22 22  < > 27 27  GLUCOSE 106* 107*  < > 109* 147*  BUN 38* 38*  < > 45* 53*  CREATININE 1.91* 1.97*  < > 2.28* 2.45*  CALCIUM 9.1 9.1  < > 8.9 9.1  MG  --  2.3  --   --   --   < > = values in this interval not displayed. Liver Function Tests:  Recent Labs  09/17/12 0818  AST 25  ALT 11  ALKPHOS 137*  BILITOT 0.4  PROT 8.1  ALBUMIN 3.2*   No results found for this basename: LIPASE, AMYLASE,  in the last 72 hours CBC:  Recent Labs  09/17/12 0818 09/19/12 0350  WBC 6.0 7.0  NEUTROABS 3.9  --   HGB 8.3* 8.1*  HCT 25.7* 24.5*  MCV 94.5 90.7  PLT PLATELET CLUMPS NOTED ON SMEAR, COUNT APPEARS ADEQUATE 219   Cardiac  Enzymes:  Recent Labs  09/17/12 0819 09/17/12 1217  TROPONINI <0.30 <0.30   BNP: No components found with this basename: POCBNP,  D-Dimer: No results found for this basename: DDIMER,  in the last 72 hours Hemoglobin A1C:  Recent Labs  09/18/12 0949  HGBA1C 6.1*   Fasting Lipid Panel: No results found for this basename: CHOL, HDL, LDLCALC, TRIG, CHOLHDL, LDLDIRECT,  in the last 72 hours Thyroid Function Tests:  Recent Labs  09/17/12 0818  TSH 3.502   Anemia Panel:  Recent Labs  09/17/12 1616  VITAMINB12 237  FERRITIN 24  TIBC 360  IRON 51   Coag Panel:   Lab Results  Component Value Date   INR 1.16 09/17/2012   INR 1.14 09/17/2012   INR 2.69* 04/29/2012    RADIOLOGY: Dg Chest Port 1 View  09/17/2012   *RADIOLOGY REPORT*  Clinical Data: Shortness of breath and congestion tonight.  PORTABLE CHEST - 1 VIEW  Comparison: 09/10/2012  Findings: Cardiac enlargement with pulmonary vascular congestion and  this p.c. interstitial and alveolar edema.  Small bilateral pleural effusions.  No pneumothorax.  Tortuous aorta.  Stable appearance since previous study.  IMPRESSION: Cardiac enlargement with pulmonary vascular congestion, edema, and small bilateral pleural effusions.   Original Report Authenticated By: Burman Nieves, M.D.      ASSESSMENT: AFib. Acute on chronic diastolic heart failure; diuresed well with acute on chronic renal insufficiency  PLAN:  No SHOB with lying flat.  Cr increased again today.  He already received his Lasix today.  Will stop lasix and give dose tomorrow based on renal function.  CHF sx better.  LVEF 50%.  Not a good coumadin candidate.   Anemia appears stable. Corky Crafts., MD  09/19/2012  1:37 PM

## 2012-09-19 NOTE — Progress Notes (Signed)
Pt up to [facility]. 2 person asst to bsc. Pt weak, unable to stand straight up

## 2012-09-19 NOTE — Progress Notes (Signed)
79yo male with anemia, thought to be multifactorial, to begin Aranesp.  Iron 51 TIBC 360 Sat Ratio 14 Ferritin 24 RBC folate 709 Vit B12 237 Hgb 8.1 Hct 24.5  Will give Aranesp 40mg  SQ Q7D x4 doses and continue to monitor.  Will add PRN bowel care.   Vernard Gambles, PharmD, BCPS  09/19/2012 6:16 AM

## 2012-09-19 NOTE — Progress Notes (Signed)
09/19/12 1138 Noted CM consult for HH vs SNF.  Pt. and family would like to go to [facility].  PT rec SNF, if family is unable to provide assistance to pt.  Lupita Leash, CSW made aware, and will start the bed search. NCM will continue to follow for dc needs. Tera Mater, RN, BSN NCM [phone]

## 2012-09-19 NOTE — Progress Notes (Signed)
Pt states no SOB. O2 stats on RA 94-96% at rest

## 2012-09-19 NOTE — Progress Notes (Signed)
Stool sample missed. Pt had BM, NT did not get sample thought sample was already done.

## 2012-09-19 NOTE — Clinical Documentation Improvement (Signed)
  DOCUMENTATION CLARIFICATION ARE NOT PART OF THE PERMANENT MEDICAL RECORD   Please update your documentation within the medical record to reflect your response to this query.                                                                                      09/19/12  Dr. Valentina Lucks,   In a better effort to capture your patient's severity of illness, reflect appropriate length of stay and utilization of resources, a review of the patient medical record has revealed the following information:   - History of Stage 3-4 Chronic Kidney Disease (CKD) per H&P   "Acute on chronic renal insufficiency apprears to be at about baseline compared to 1/14"  Progress Notes signed by Lillia Mountain, MD at 09/19/2012 5:52 AM   BUN/Cr./GFR this admission     (white male) 38/1.91/29     09/17/2012 at 0252 45/2.28/23     09/18/2012 at 0555 53/2.45/21     09/19/2012 at 0350  Creat. has increased 0.54 mg/dl in 48 hours with diuresis.    BUN/Cr./GFR    04/29/2012     (white male) 31/1.99/27   Based on your clinical judgment, please document in the progress notes and discharge summary if a condition below provides greater specificity regarding the patient's renal function this admission:   - Acute Kidney Injury on Stage 3-4 CKD   - Other Condition   - Unable to Clinically Determine   In responding to this query please exercise your independent judgment.     The fact that a query is asked, does not imply that any particular answer is desired or expected.   Reviewed: 09/21/12 - acute on chronic renal failure documented by dr. Donette Larry pn 09/21/12.  Mathis Dad RN  Thank You,  Jerral Ralph  RN BSN CCDS Certified Clinical Documentation Specialist: Cell   [phone]  Health Information Management [place]   TO RESPOND TO THE THIS QUERY, FOLLOW THE INSTRUCTIONS BELOW:  1. If needed, update documentation for the patient's encounter via the notes activity.  2. Access this  query again and click edit on the In [employer].  3. After updating, or not, click F2 to complete all highlighted (required) fields concerning your review. Select "additional documentation in the medical record" OR "no additional documentation provided".  4. Click Sign note button.  5. The deficiency will fall out of your In Basket *Please let us know if you are not able to complete this workflow by phone or e-mail (listed below).

## 2012-09-19 NOTE — Progress Notes (Addendum)
Put Pt on RA will monitor stats to see how Pt tolerates. Lung sounds unchanged from 09-18-12

## 2012-09-20 LAB — GLUCOSE, CAPILLARY: Glucose-Capillary: 144 mg/dL — ABNORMAL HIGH (ref 70–99)

## 2012-09-20 LAB — BASIC METABOLIC PANEL
Calcium: 8.9 mg/dL (ref 8.4–10.5)
GFR calc non Af Amer: 19 mL/min — ABNORMAL LOW (ref >90)
Glucose, Bld: 146 mg/dL — ABNORMAL HIGH (ref 70–99)
Sodium: 133 mEq/L — ABNORMAL LOW (ref 135–145)

## 2012-09-20 MED ORDER — LOPERAMIDE HCL 2 MG PO CAPS
2.0000 mg | ORAL_CAPSULE | ORAL | Status: DC | PRN
  Administered 2012-09-20: 2 mg via ORAL
  Filled 2012-09-20: qty 1

## 2012-09-20 MED ORDER — CYANOCOBALAMIN 1000 MCG/ML IJ SOLN: 1000.0000 ug | Freq: Every day | INTRAMUSCULAR | Status: DC

## 2012-09-20 MED ORDER — AMLODIPINE BESYLATE 5 MG PO TABS: 5.0000 mg | ORAL_TABLET | Freq: Every day | ORAL | Status: DC

## 2012-09-20 MED ORDER — FERROUS SULFATE 325 (65 FE) MG PO TABS: 325.0000 mg | ORAL_TABLET | Freq: Two times a day (BID) | ORAL | Status: DC

## 2012-09-20 NOTE — Progress Notes (Addendum)
Physical Therapy Treatment Patient Details Name: [name] MRN: [id number] DOB: 14-Jan-1918 Today's Date: 09/20/2012 Time: 9629-5284 PT Time Calculation (min): 23 min  PT Assessment / Plan / Recommendation Comments on Treatment Session   Pt with slow progression of mobility at this date.  Unable to increase ambulation distance.  Required increased (A) for sit>stand & was incontinent of bladder with standing.      Follow Up Recommendations  SNF;Other (comment)     Does the patient have the potential to tolerate intense rehabilitation     Barriers to Discharge        Equipment Recommendations  None recommended by PT    Recommendations for Other Services    Frequency Min 3X/week   Plan Discharge plan remains appropriate;Frequency remains appropriate    Precautions / Restrictions Precautions Precautions: Fall Restrictions Weight Bearing Restrictions: No       Mobility  Bed Mobility Bed Mobility: Supine to Sit;Sitting - Scoot to Edge of Bed Supine to Sit: 4: Min assist;HOB flat Sitting - Scoot to Edge of Bed: 4: Min guard Details for Bed Mobility Assistance: cues for technique.  Increased time.  (A) to bring shoulders/trunk to sitting upright.  Transfers Transfers: Sit to Stand;Stand to Sit Sit to Stand: 3: Mod assist;With upper extremity assist;From bed Stand to Sit: 4: Min assist;With upper extremity assist;With armrests;To chair/3-in-1 Details for Transfer Assistance: Multimodal cueing for hand placement & technique.  Increased (A) to achieve standing today compared to last session.  Facilitation for tall posture & to shift weight forwards.  Pt incontinent of bladder with initial standing.    Ambulation/Gait Ambulation/Gait Assistance: 4: Min assist Ambulation Distance (Feet): 20 Feet Assistive device: Rolling walker Ambulation/Gait Assistance Details: (A) for balance & safety.  Pt with flexed posture & pushes RW too far out in front of him.  Faciliation at hips &  chest to increase upright posture.   Gait Pattern: Decreased stride length;Right flexed knee in stance;Left flexed knee in stance;Trunk flexed General Gait Details: Effortful.   Stairs: No Wheelchair Mobility Wheelchair Mobility: No    Exercises General Exercises - Lower Extremity Ankle Circles/Pumps: AROM;Both;10 reps Hip ABduction/ADduction: AROM;Both;10 reps Hip Flexion/Marching: AROM;Both;10 reps     PT Goals Acute Rehab PT Goals Time For Goal Achievement: 10/02/12 Potential to Achieve Goals: Good Pt will go Supine/Side to Sit: with modified independence;with HOB 0 degrees PT Goal: Supine/Side to Sit - Progress: Progressing toward goal Pt will go Sit to Stand: with modified independence PT Goal: Sit to Stand - Progress: Not met Pt will Transfer Bed to Chair/Chair to Bed: with modified independence Pt will Ambulate: 16 - 50 feet;with modified independence;with least restrictive assistive device;with rolling walker PT Goal: Ambulate - Progress: Not met Pt will Go Up / Down Stairs: 6-9 stairs;with supervision;with least restrictive assistive device;with rail(s)  Visit Information  Last PT Received On: 09/20/12 Assistance Needed: +1    Subjective Data      Cognition  Cognition Arousal/Alertness: Awake/alert Behavior During Therapy: WFL for tasks assessed/performed Overall Cognitive Status: Impaired/Different from baseline Area of Impairment: Orientation;Problem solving Orientation Level: Disoriented to;Time (When asked month, he states "december") Problem Solving: Slow processing;Requires verbal cues;Requires tactile cues    Balance     End of Session PT - End of Session Equipment Utilized During Treatment: Gait belt Activity Tolerance: Patient limited by fatigue Patient left: in chair;with call bell/phone within reach;with nursing in room Nurse Communication: Mobility status     [name], [place] [phone] 09/20/2012

## 2012-09-20 NOTE — Progress Notes (Signed)
Pt c/o nausea in am, PRN Zofran given with good affect, pt oob in chair, pts BP low while resting in chair, pt back to bed BP rechecked and BP WNL, will continue to monitor

## 2012-09-20 NOTE — Progress Notes (Signed)
Patient Name: [name] Date of Encounter: 09/20/2012     SUBJECTIVE:: Sitting at nearly ninety degrees, eating and able to communicate with no dyspnea or cmplaint. Family present in the room.  TELEMETRY:  Atrial fib with controlled rate.: Filed Vitals:   09/20/12 0634 09/20/12 0638 09/20/12 1435 09/20/12 1503  BP: 137/54  98/39 136/34  Pulse: 80 85 87   Temp: 98.4 F (36.9 C)     TempSrc: Oral     Resp: 20  20   Height:      Weight: 100.562 kg (221 lb 11.2 oz)     SpO2: 99% 93% 92%     Intake/Output Summary (Last 24 hours) at 09/20/12 1752 Last data filed at 09/20/12 1242  Gross per 24 hour  Intake    880 ml  Output      0 ml  Net    880 ml    LABS: Basic Metabolic Panel:  Recent Labs  21/30/86 0350 09/20/12 0545  NA 137 133*  K 4.0 3.9  CL 97 96  CO2 27 26  GLUCOSE 147* 146*  BUN 53* 64*  CREATININE 2.45* 2.72*  CALCIUM 9.1 8.9   CBC:  Recent Labs  09/19/12 0350  WBC 7.0  HGB 8.1*  HCT 24.5*  MCV 90.7  PLT 219    BNP: No components found with this basename: POCBNP,  Hemoglobin A1C:  Recent Labs  09/18/12 0949  HGBA1C 6.1*     Radiology/Studies: 09/17/12 Clinical Data: Shortness of breath and congestion tonight.  PORTABLE CHEST - 1 VIEW  Comparison: 09/10/2012  Findings: Cardiac enlargement with pulmonary vascular congestion  and this p.c. interstitial and alveolar edema. Small bilateral  pleural effusions. No pneumothorax. Tortuous aorta. Stable  appearance since previous study.  IMPRESSION:  Cardiac enlargement with pulmonary vascular congestion, edema, and  small bilateral pleural effusions.  Original Report Authenticated By: Burman Nieves, M.D.    Physical Exam: Blood pressure 136/34, pulse 87, temperature 98.4 F (36.9 C), temperature source Oral, resp. rate 20, height 5\' 9"  (1.753 m), weight 100.562 kg (221 lb 11.2 oz), SpO2 92.00%. Weight change: -0.137 kg (-4.9 oz)   Pale and in no distress. Chest clear anterior.  Decreased basilar breath sounds. Cardia reveals irregularly irregular rhythm. Neuro significant for lack of spontaneous conversation.  ASSESSMENT:  1. Acute on chronic diastolic heart failure improved 2. Acute on chronic kidney disease worse 3. Severe anemia influencing response to diuretics. 4. Borderline rate control of atrial fibrillation  Plan:  1. Agree with holding diuretic today and following renal function before resuming. 2. ? Transfuse to Hgb > 9.5 3. Consider d/c amlodipine and titrate beta blocker dose upward for better rate control.  [name] 09/20/2012, 5:52 PM

## 2012-09-20 NOTE — Plan of Care (Signed)
Problem: Phase II Progression Outcomes Goal: Walk in hall or up in chair TID Outcome: Completed/Met Date Met:  09/20/12 Pt up in chair, tolerating well

## 2012-09-20 NOTE — Progress Notes (Signed)
Subjective: Feeling much better  Objective: Vital signs in last 24 hours: Temp:  [98 F (36.7 C)-99.8 F (37.7 C)] 98.4 F (36.9 C) (06/12 0634) Pulse Rate:  [76-85] 85 (06/12 0638) Resp:  [18-20] 20 (06/12 0634) BP: (130-142)/(53-70) 137/54 mmHg (06/12 0634) SpO2:  [93 %-99 %] 93 % (06/12 0638) Weight:  [100.562 kg (221 lb 11.2 oz)] 100.562 kg (221 lb 11.2 oz) (06/12 0634) Weight change: -0.137 kg (-4.9 oz) Last BM Date: 09/19/12  Intake/Output from previous day: 06/11 0701 - 06/12 0700 In: 720 [P.O.:720] Out: 500 [Urine:500] Intake/Output this shift:    General appearance: alert and cooperative Resp: clear to auscultation bilaterally Cardio: irregularly irregular rhythm and systolic murmur: systolic ejection 2/6, crescendo and decrescendo at 2nd right intercostal space Extremities: extremities normal, atraumatic, no cyanosis or edema  Lab Results:  Recent Labs  09/17/12 0818 09/19/12 0350  WBC 6.0 7.0  HGB 8.3* 8.1*  HCT 25.7* 24.5*  PLT PLATELET CLUMPS NOTED ON SMEAR, COUNT APPEARS ADEQUATE 219   BMET  Recent Labs  09/19/12 0350 09/20/12 0545  NA 137 133*  K 4.0 3.9  CL 97 96  CO2 27 26  GLUCOSE 147* 146*  BUN 53* 64*  CREATININE 2.45* 2.72*  CALCIUM 9.1 8.9    Studies/Results: No results found.  Medications: I have reviewed the patient's current medications.  Assessment/Plan: 1. Acute on chronic diastolic CHF - per cardiology  2. Low normal LVF EF 51% by echo  3. Mild AS  4. Anemia, likely multifactorial secondary to renal failure, iron deficiency and b12 defficiency. Start on B12 and iron, Aranesp injection yesterday  5. Acute on chronic renal insufficiency creatinine is rising, lasix to be held for now 6. DM check CBGs acceptable. AIC only 6.1, follow CBC, no meds(glipizide held)  7. HTN amlodipine added, blood pressure acceptable  8. Atrial fibrillation - not on anticoagulation due to anemia/advanced age, rate controlled on metoprolol   9.Disposition [facility]   LOS: 3 days    [facility] 09/20/2012, 6:44 AM

## 2012-09-20 NOTE — Progress Notes (Signed)
Pt had an LBM 3x, with dark green colored stool. One sent for occult blood as ordered. Imodium 2mg  PO given as PRN order for diarrhea. Will continue to monitor pt.

## 2012-09-20 NOTE — Discharge Summary (Addendum)
Physician Discharge Summary  Patient ID: [name] MRN: [id number] DOB/AGE: 77-17-1919 77 y.o.  Admit date: 09/17/2012 Discharge date: 09/20/2012  Admission Diagnoses: Acute diastolic congestive heart failure Atrial fibrillation Diabetes mellitus type 2 Anemia, multifactorial Acute on chronic renal insufficiency Hypertension Transitional cell cancer of the bladder History of stroke Coronary artery disease  Discharge Diagnoses:  Principal Problem:   Acute diastolic CHF (congestive heart failure) Active Problems:   Atrial fibrillation   T2DM (type 2 diabetes mellitus)   Hypertension Transitional cell cancer of the bladder   Anemia   Acute on chronic renal insufficiency History of stroke Coronary artery disease   Discharged Condition: good  Hospital Course: The patient was admitted on 09/17/2012 when he woke up acutely short of breath. He denies any chest pain or edema. BNP was 2213. Chest x-ray showed cardiac enlargement with pulmonary vascular congestion edema and small bilateral pleural effusions. Cardiac enzymes were negative. The patient was followed by Dr. Mayford Knife of cardiology. He was placed on IV Lasix. ACE inhibitor held because of renal failure.  He is considered to be a poor Coumadin candidate. He had an echocardiogram just prior to admission showing mild aortic stenosis and an ejection fraction of 51%. The patient diuresed well with IV Lasix. He was in atrial fibrillation with rate controlled on the Toprol running in the 80s to 90s. His creatinine rose from a baseline of about 2 up to 2.72 and Lasix was held the last 24 hours of hospitalization and can be resumed as an outpatient when his renal function is improving at a dose of 40 mg a day. The patient did have a significant anemia with hemoglobin 7.4. Ferritin was 24, iron saturation 16% and B12 low at 237. The patients stool was occult blood negative. The patient was started on iron orally and B12 IM. His anemia was  felt to be multifactorial related to chronic renal insufficiency, iron deficiency and B12 deficiency and he was given a dose of Aranesp 40 mcg on June 11, can be repeated every 3-4 weeks. His diabetes remained under reasonable control the 100s off of his sulfonylurea, blood sugars will need to be monitored as an outpatient and restart on medication if needed. As mentioned above at discharge the patient's creatinine had risen to 2.72, diuretics held, baseline was 2.0 in January 2014.  Addendum (6/16):  The patient's discharge was delayed because of acute on chronic renal failure, creatinine and BUN rose high as 97 3.82. Diuretics were held. Renal ultrasound showed no obstruction. The patient was seen by nephrology felt the patient had acute on chronic renal failure likely from overdiuresis and possibly episodes of hypotension. Urinalysis unremarkable. Norvasc had been started for hypertension, this was discontinued because of low blood pressure. The patient's creatinine began to improve off diuretics at discharge BUN and creatinine were 89 and 2.73 with normal electrolytes. The patient's diuretics will continue to be held and can be restarted if the patient shows signs of congestive heart failure, he will likely require furosemide 40 mg a day for maintenance started within a few days..the patient's baseline creatinine is 2.0.  The patients atrial fibrillation rate was controlled on metoprolol The patient's blood sugars began to rise off treatment with blood sugars over 200 in the low dose glipizide ER 2.5 mg a day was started.   Low-sodium carbohydrate modified diet Full code  Consults: cardiology  Significant Diagnostic Studies: labs: As above and radiology: CXR: Cardiac enlargement with pulmonary vascular congestion, edema and small bilateral pleural  effusions   Treatments: cardiac meds: metoprolol and furosemide and anticoagulation: ASA  Discharge Exam: Blood pressure 137/54, pulse 85, temperature  98.4 F (36.9 C), temperature source Oral, resp. rate 20, height 5\' 9"  (1.753 m), weight 100.562 kg (221 lb 11.2 oz), SpO2 93.00%. General appearance: alert and cooperative Resp: clear to auscultation bilaterally Cardio: irregularly irregular rhythm  Disposition: 01-Home or Self Care   Future Appointments Provider Department Dept Phone   06/07/2013 11:00 AM [facility] [phone]   06/07/2013 12:00 PM Evern Bio, NP [facility] [phone]       Medication List    STOP taking these medications       cilostazol 100 MG tablet  Commonly known as:  PLETAL     furosemide 40 MG tablet  Commonly known as:  LASIX     glipiZIDE 10 MG tablet  Commonly known as:  GLUCOTROL     lisinopril 20 MG tablet  Commonly known as:  PRINIVIL,ZESTRIL      TAKE these medications       Glipizide ER 2.5 mg  Take 2.5 mg once each morning by mouth       aspirin EC 81 MG tablet  Take 81 mg by mouth every morning.     beta carotene w/minerals tablet  Take 1 tablet by mouth every morning.     cyanocobalamin 1000 MCG/ML injection  Commonly known as:  (VITAMIN B-12)  Inject 1 mL (1,000 mcg total) into the muscle weely for 4 doses, then once monthly     ferrous sulfate 325 (65 FE) MG tablet  Take 1 tablet (325 mg total) by mouth 2 (two) times daily with a meal.     metoprolol succinate 25 MG 24 hr tablet  Commonly known as:  TOPROL-XL  Take 25 mg by mouth daily after breakfast.     niacin 500 MG CR tablet  Commonly known as:  NIASPAN  Take 500 mg by mouth at bedtime.     tamsulosin 0.4 MG Caps  Commonly known as:  FLOMAX  Take 0.4 mg by mouth daily after breakfast.    temazapam 7.5 mg one at bedtime as needed or insomnia       Follow-up Information   Follow up In 6 weeks.      Follow up with Lillia Mountain, MD In 6 weeks. (2 weeks after discharge from nursing home)    Contact information:   [address]  [address], [address] Jaynie Crumble [place] [address] [phone]       Signed: Lillia Mountain 09/20/2012, 6:59 AM

## 2012-09-21 LAB — GLUCOSE, CAPILLARY: Glucose-Capillary: 173 mg/dL — ABNORMAL HIGH (ref 70–99)

## 2012-09-21 LAB — BASIC METABOLIC PANEL
GFR calc Af Amer: 16 mL/min — ABNORMAL LOW (ref >90)
GFR calc non Af Amer: 13 mL/min — ABNORMAL LOW (ref >90)
Glucose, Bld: 121 mg/dL — ABNORMAL HIGH (ref 70–99)
Potassium: 4.1 mEq/L (ref 3.5–5.1)
Sodium: 132 mEq/L — ABNORMAL LOW (ref 135–145)

## 2012-09-21 MED ORDER — SODIUM CHLORIDE 0.9 % IV SOLN
Freq: Once | INTRAVENOUS | Status: AC
  Administered 2012-09-21: 08:00:00 via INTRAVENOUS

## 2012-09-21 MED ORDER — TEMAZEPAM 7.5 MG PO CAPS: 7.5000 mg | ORAL_CAPSULE | Freq: Every evening | ORAL | Status: DC | PRN

## 2012-09-21 MED ORDER — AMLODIPINE BESYLATE 2.5 MG PO TABS
2.5000 mg | ORAL_TABLET | Freq: Every day | ORAL | Status: DC
  Administered 2012-09-21: 2.5 mg via ORAL
  Filled 2012-09-21 (×2): qty 1

## 2012-09-21 NOTE — Progress Notes (Signed)
pts O2 88% on room air, on 2L O2 went up to 94%, pt appears more sleepy today, IV fluids started, vss, will continue to monitor

## 2012-09-21 NOTE — Progress Notes (Signed)
Assisting Clinical Child psychotherapist (CSW) provided pt daughter with bed offers. Daughter Selena Batten has accepted placement at [facility] and plans to complete paperwork today. CSW has contacted Jasmine December at [facility] and [place] at [facility]. Per RN plan is for pt to dc tomorrow. Unit CSW to remain following.  Theresia Bough, MSW, Theresia Majors [phone]

## 2012-09-21 NOTE — Progress Notes (Signed)
SUBJECTIVE:  Denies any SOB  OBJECTIVE:   Vitals:   Filed Vitals:   09/20/12 1435 09/20/12 1503 09/20/12 2200 09/21/12 0647  BP: 98/39 136/34 123/45 111/48  Pulse: 87  87 74  Temp:   98.8 F (37.1 C) 98.1 F (36.7 C)  TempSrc:   Oral Oral  Resp: 20  18 18   Height:      Weight:    98.022 kg (216 lb 1.6 oz)  SpO2: 92%  95% 97%   I&O's:   Intake/Output Summary (Last 24 hours) at 09/21/12 1610 Last data filed at 09/21/12 0900  Gross per 24 hour  Intake    703 ml  Output      0 ml  Net    703 ml   TELEMETRY: Reviewed telemetry pt in afib with CVR:     PHYSICAL EXAM General: Well developed, well nourished, in no acute distress Head: Eyes PERRLA, No xanthomas.   Normal cephalic and atramatic Lungs:   CTA Heart:   Irregularly irregular  S1 S2 Pulses are 2+ & equal.. Abdomen: Bowel sounds are positive, abdomen soft and non-tender without masse Extremities:   No clubbing, cyanosis or edema.  DP +1 Neuro: Alert and oriented X 3. Psych:  Good affect, responds appropriately   LABS: Basic Metabolic Panel:  Recent Labs  96/04/54 0545 09/21/12 0445  NA 133* 132*  K 3.9 4.1  CL 96 94*  CO2 26 26  GLUCOSE 146* 121*  BUN 64* 82*  CREATININE 2.72* 3.59*  CALCIUM 8.9 8.8   Liver Function Tests: No results found for this basename: AST, ALT, ALKPHOS, BILITOT, PROT, ALBUMIN,  in the last 72 hours No results found for this basename: LIPASE, AMYLASE,  in the last 72 hours CBC:  Recent Labs  09/19/12 0350  WBC 7.0  HGB 8.1*  HCT 24.5*  MCV 90.7  PLT 219   Hemoglobin A1C:  Recent Labs  09/18/12 0949  HGBA1C 6.1*   Coag Panel:   Lab Results  Component Value Date   INR 1.16 09/17/2012   INR 1.14 09/17/2012   INR 2.69* 04/29/2012    RADIOLOGY: Dg Chest Port 1 View  09/17/2012   *RADIOLOGY REPORT*  Clinical Data: Shortness of breath and congestion tonight.  PORTABLE CHEST - 1 VIEW  Comparison: 09/10/2012  Findings: Cardiac enlargement with pulmonary vascular  congestion and this p.c. interstitial and alveolar edema.  Small bilateral pleural effusions.  No pneumothorax.  Tortuous aorta.  Stable appearance since previous study.  IMPRESSION: Cardiac enlargement with pulmonary vascular congestion, edema, and small bilateral pleural effusions.   Original Report Authenticated By: Burman Nieves, M.D.   ASSESSMENT:  1. Acute on chronic diastolic heart failure - resolved 2. Acute on chronic kidney disease worse  3. Severe anemia influencing response to diuretics.  4. Chronic atrial fibrillation rate controlled  Plan:  1. Agree with holding diuretics and giving gentle fluid hydration 2. ? Transfuse to Hgb > 9.5      Quintella Reichert, MD  09/21/2012  9:27 AM

## 2012-09-21 NOTE — Progress Notes (Signed)
Subjective: Pt without sob family stay insomnia Cr elevated  Objective: Vital signs in last 24 hours: Temp:  [98.1 F (36.7 C)-98.8 F (37.1 C)] 98.1 F (36.7 C) (06/13 0647) Pulse Rate:  [74-87] 74 (06/13 0647) Resp:  [18-20] 18 (06/13 0647) BP: (98-136)/(34-48) 111/48 mmHg (06/13 0647) SpO2:  [92 %-97 %] 97 % (06/13 0647) Weight:  [98.022 kg (216 lb 1.6 oz)] 98.022 kg (216 lb 1.6 oz) (06/13 0647) Weight change: -2.54 kg (-5 lb 9.6 oz) Last BM Date: 09/20/12  Intake/Output from previous day: 06/12 0701 - 06/13 0700 In: 943 [P.O.:940; I.V.:3] Out: -  Intake/Output this shift:    General appearance: alert Resp: clear to auscultation bilaterally Cardio: regular rate and rhythm Extremities: extremities normal, atraumatic, no cyanosis or edema  Lab Results:  Recent Labs  09/19/12 0350  WBC 7.0  HGB 8.1*  HCT 24.5*  PLT 219   BMET  Recent Labs  09/20/12 0545 09/21/12 0445  NA 133* 132*  K 3.9 4.1  CL 96 94*  CO2 26 26  GLUCOSE 146* 121*  BUN 64* 82*  CREATININE 2.72* 3.59*  CALCIUM 8.9 8.8    Studies/Results: No results found.  Medications: I have reviewed the patient's current medications.  Assessment/Plan: A/C CHF exac of Diastolic Heart failure-  Over diuresing- with worse renal function A/C Renal Failure- Cr 3.5- ( up from 2.7) Gentle IVF x 1L- repeat BMET in am BP on lower side- decrease Norvasc 2.5 mg Afib rate control on BB Insomnia- add Tamezepam 15 mg  qhs prn SNF  In am if Renal function better.- socail worker consult. DM - off med- diet control.   LOS: 4 days   Taleia Sadowski 09/21/2012, 7:28 AM

## 2012-09-22 ENCOUNTER — Inpatient Hospital Stay (HOSPITAL_COMMUNITY): Payer: Medicare HMO

## 2012-09-22 LAB — BASIC METABOLIC PANEL
BUN: 97 mg/dL — ABNORMAL HIGH (ref 6–23)
Calcium: 8.4 mg/dL (ref 8.4–10.5)
GFR calc non Af Amer: 12 mL/min — ABNORMAL LOW (ref >90)
Glucose, Bld: 123 mg/dL — ABNORMAL HIGH (ref 70–99)
Sodium: 132 mEq/L — ABNORMAL LOW (ref 135–145)

## 2012-09-22 LAB — GLUCOSE, CAPILLARY
Glucose-Capillary: 129 mg/dL — ABNORMAL HIGH (ref 70–99)
Glucose-Capillary: 180 mg/dL — ABNORMAL HIGH (ref 70–99)

## 2012-09-22 LAB — URINALYSIS, ROUTINE W REFLEX MICROSCOPIC
Glucose, UA: NEGATIVE mg/dL
Hgb urine dipstick: NEGATIVE
Specific Gravity, Urine: 1.016 (ref 1.005–1.030)

## 2012-09-22 LAB — URINE MICROSCOPIC-ADD ON

## 2012-09-22 LAB — PROTEIN / CREATININE RATIO, URINE
Creatinine, Urine: 120.62 mg/dL
Total Protein, Urine: 21.5 mg/dL

## 2012-09-22 NOTE — Progress Notes (Signed)
79yo male with anemia, thought to be multifactorial, on aranesp per pharmacy.   Iron 51 TIBC 360 Sat Ratio 14 Ferritin 24 RBC folate 709 Vit B12 237 Hgb 8.1 Hct 24.5  No CBC since 6/1  -Continue to give Aranesp 40mg  SQ weekly x 4 doses and continue to monitor -F/U next Hgb  Abran Duke, PharmD Clinical Pharmacist Phone: [phone] Pager: [phone] 09/22/2012 10:37 AM

## 2012-09-22 NOTE — Progress Notes (Signed)
Pt resting in bed asleep.

## 2012-09-22 NOTE — Progress Notes (Signed)
Pt oob x 2 today, tolerated well, pt more awake, did better with transfers, urine sample sent, will continue to monitor
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
## 2012-09-22 NOTE — Progress Notes (Signed)
Subjective:  No real complaints, not short of breath  Objective:  Vital Signs in the last 24 hours: BP 99/46  Pulse 76  Temp(Src) 98.3 F (36.8 C) (Oral)  Resp 18  Ht 5\' 9"  (1.753 m)  Wt 100.8 kg (222 lb 3.6 oz)  BMI 32.8 kg/m2  SpO2 94%  Physical Exam: Pale, no distress Lungs:  Decreased breath sounds at bases Cardiac:  Irregular rhythm Extremities:  No edema present  Intake/Output from previous day: 06/13 0701 - 06/14 0700 In: 530 [P.O.:530] Out: -  Weight Filed Weights   09/20/12 0634 09/21/12 0647 09/22/12 0534  Weight: 100.562 kg (221 lb 11.2 oz) 98.022 kg (216 lb 1.6 oz) 100.8 kg (222 lb 3.6 oz)    Lab Results: Basic Metabolic Panel:  Recent Labs  16/10/96 0445 09/22/12 0525  NA 132* 132*  K 4.1 4.3  CL 94* 96  CO2 26 23  GLUCOSE 121* 123*  BUN 82* 97*  CREATININE 3.59* 3.82*    BNP    Component Value Date/Time   PROBNP 2569.0* 09/17/2012 0819   Telemetry: Atrial fibrillation with controlled response  Assessment/Plan:  1. Acute on chronic diastolic heart failure - resolved  2. Acute on chronic kidney disease continue to worsen 3. Chronic atrial fibrillation rate controlled  Rec:  Major problem is worsening renal failure. Atrial fibrillation rate is controlled and would continue to hold diuretics.   Darden Palmer  MD [facility]  09/22/2012, 10:53 AM

## 2012-09-22 NOTE — Progress Notes (Signed)
Subjective: Some cough; no SOB Scr rising   Objective: Vital signs in last 24 hours: Temp:  [98.3 F (36.8 C)-98.8 F (37.1 C)] 98.3 F (36.8 C) (06/14 0534) Pulse Rate:  [87-89] 87 (06/14 0534) Resp:  [18-23] 18 (06/14 0534) BP: (97-124)/(45-53) 124/45 mmHg (06/14 0534) SpO2:  [93 %-98 %] 93 % (06/14 0534) Weight:  [100.8 kg (222 lb 3.6 oz)] 100.8 kg (222 lb 3.6 oz) (06/14 0534) Weight change: 2.778 kg (6 lb 2 oz) Last BM Date: 09/20/12  Intake/Output from previous day: 06/13 0701 - 06/14 0700 In: 530 [P.O.:530] Out: -  Intake/Output this shift:    General appearance: alert Resp: clear to auscultation bilaterally Cardio: irregularly irregular rhythm Extremities: extremities normal, atraumatic, no cyanosis or edema  Lab Results: No results found for this basename: WBC, HGB, HCT, PLT,  in the last 72 hours BMET  Recent Labs  09/21/12 0445 09/22/12 0525  NA 132* 132*  K 4.1 4.3  CL 94* 96  CO2 26 23  GLUCOSE 121* 123*  BUN 82* 97*  CREATININE 3.59* 3.82*  CALCIUM 8.8 8.4    Studies/Results: No results found.  Medications: I have reviewed the patient's current medications.  Assessment/Plan: A/C CHF exac of Diastolic Heart failure- Over diuresing- with worse renal function  A/C Renal Failure- SCr 3.8-worsing- with baseline 2.0- nephrology consult- ? Need volume- check Renal u/s - r/o obstruction BP - ok Afib rate control on BB  Insomnia- add Tamezepam 15 mg qhs prn  SNF- hold off- with Scr rising- nephrology consult. DM - off med- diet control.    LOS: 5 days   Curlee Bogan 09/22/2012, 9:04 AM

## 2012-09-23 LAB — GLUCOSE, CAPILLARY: Glucose-Capillary: 267 mg/dL — ABNORMAL HIGH (ref 70–99)

## 2012-09-23 LAB — RENAL FUNCTION PANEL
Albumin: 2.5 g/dL — ABNORMAL LOW (ref 3.5–5.2)
BUN: 103 mg/dL — ABNORMAL HIGH (ref 6–23)
GFR calc Af Amer: 17 mL/min — ABNORMAL LOW (ref >90)
Phosphorus: 3.8 mg/dL (ref 2.3–4.6)
Potassium: 4.6 mEq/L (ref 3.5–5.1)
Sodium: 131 mEq/L — ABNORMAL LOW (ref 135–145)

## 2012-09-23 MED ORDER — INSULIN ASPART 100 UNIT/ML ~~LOC~~ SOLN: 0.0000 [IU] | Freq: Three times a day (TID) | SUBCUTANEOUS | Status: DC

## 2012-09-23 MED ORDER — INSULIN ASPART 100 UNIT/ML ~~LOC~~ SOLN
0.0000 [IU] | Freq: Three times a day (TID) | SUBCUTANEOUS | Status: DC
  Administered 2012-09-23: 3 [IU] via SUBCUTANEOUS
  Administered 2012-09-24: 1 [IU] via SUBCUTANEOUS
  Administered 2012-09-24: 2 [IU] via SUBCUTANEOUS

## 2012-09-23 MED ORDER — INSULIN ASPART 100 UNIT/ML ~~LOC~~ SOLN
0.0000 [IU] | Freq: Every day | SUBCUTANEOUS | Status: DC
  Administered 2012-09-23: 3 [IU] via SUBCUTANEOUS

## 2012-09-23 NOTE — Progress Notes (Signed)
Subjective: No c/o  Objective: Vital signs in last 24 hours: Temp:  [98.3 F (36.8 C)-98.9 F (37.2 C)] 98.3 F (36.8 C) (06/15 0436) Pulse Rate:  [76-85] 85 (06/15 0436) Resp:  [18-20] 20 (06/15 0436) BP: (99-137)/(44-51) 137/51 mmHg (06/15 0436) SpO2:  [94 %-96 %] 94 % (06/15 0436) Weight:  [100.3 kg (221 lb 1.9 oz)] 100.3 kg (221 lb 1.9 oz) (06/15 0436) Weight change: -0.5 kg (-1 lb 1.6 oz) Last BM Date: 09/22/12  Intake/Output from previous day: 06/14 0701 - 06/15 0700 In: 600 [P.O.:600] Out: 1 [Stool:1] Intake/Output this shift:    General appearance: alert Resp: clear to auscultation bilaterally Cardio: irregularly irregular rhythm Extremities: extremities normal, atraumatic, no cyanosis or edema  Lab Results: No results found for this basename: WBC, HGB, HCT, PLT,  in the last 72 hours BMET  Recent Labs  09/22/12 0525 09/23/12 0345  NA 132* 131*  K 4.3 4.6  CL 96 97  CO2 23 24  GLUCOSE 123* 144*  BUN 97* 103*  CREATININE 3.82* 3.37*  CALCIUM 8.4 8.8    Studies/Results: US Renal  09/22/2012   *RADIOLOGY REPORT*  Clinical Data:  Diabetic, hypertensive with worsening renal function  RENAL/URINARY TRACT ULTRASOUND COMPLETE  Comparison:  Prior CT abdomen/pelvis 04/29/2012.  Findings:  Right Kidney:  Normal in size (10.6 cm) and parenchymal echogenicity.  No evidence of mass or hydronephrosis.  Left Kidney:  Normal in size (12.4 cm) and parenchymal echogenicity.  No evidence of mass or hydronephrosis.  Bladder:  Appears normal for degree of bladder distention.  Other:  Small left pleural effusion.  IMPRESSION:  1.  No hydronephrosis or acute renal abnormality. 2.  Small left pleural effusion.   Original Report Authenticated By: Malachy Moan, M.D.    Medications: I have reviewed the patient's current medications.  Assessment/Plan: A/C CHF exac of Diastolic Heart failure- stable- off diuretics A/C Renal Failure- SCr -slowly improving; neprology consult  noted- u/s renal ok BP - ok- off norvasc  Afib rate control on BB  Insomnia- add Tamezepam 15 mg qhs prn  SNF- probable in 1-2 days DM - off med- diet control. Anemia- on aranesp- ACD    LOS: 6 days   Lenise Jr 09/23/2012, 8:36 AM

## 2012-09-23 NOTE — Progress Notes (Addendum)
Requires two person assist to mobilize self.  Client is weak, but, will attempt to put self in bed from chair if he wants to go back to bed at that moment.  Both bed alarms and chair alarm in place for patient safely.  Incontinent of urine and stool.  58 Spoke with family who had questions related to problems with kidneys.  Reviewed notes and informed them when asked about creatinine that labs have worsened, but, physicians are working together,"nephrology and cardiology" to adjust medications and watch labs related to his renal and cardiac function.    They were assured that we will continue to be diligent in monitoring him.  Client is up in chair.  He is voiding, but, incontinent.

## 2012-09-23 NOTE — Progress Notes (Signed)
[facility] ROUNDING NOTE   Subjective:   Interval History: none.  Objective:  Vital signs in last 24 hours:  Temp:  [98.3 F (36.8 C)-98.9 F (37.2 C)] 98.3 F (36.8 C) (06/15 0436) Pulse Rate:  [83-85] 83 (06/15 0952) Resp:  [18-20] 20 (06/15 0436) BP: (117-137)/(44-70) 126/70 mmHg (06/15 0952) SpO2:  [94 %-96 %] 94 % (06/15 0436) Weight:  [100.3 kg (221 lb 1.9 oz)] 100.3 kg (221 lb 1.9 oz) (06/15 0436)  Weight change: -0.5 kg (-1 lb 1.6 oz) Filed Weights   09/21/12 0647 09/22/12 0534 09/23/12 0436  Weight: 98.022 kg (216 lb 1.6 oz) 100.8 kg (222 lb 3.6 oz) 100.3 kg (221 lb 1.9 oz)    Intake/Output: I/O last 3 completed shifts: In: 600 [P.O.:600] Out: 1 [Stool:1]   Intake/Output this shift:  Total I/O In: 120 [P.O.:120] Out: -   General: elderly  Head: Normocephalic and atraumatic.  Neck: Supple; no masses or thyromegaly. JVP not elevated  Lungs: Clear throughout to auscultation. No wheezes, crackles, or rhonchi. No acute distress.  Heart: Irregular no m,r, g  Abdomen: Soft, nontender and nondistended. No masses, hepatosplenomegaly or hernias noted. Normal bowel sounds, without guarding, and without rebound.  Pulses: No carotid, renal, femoral bruits. DP and PT symmetrical and equal  Extremities: Without clubbing or edema.  Neurologic: somnolent     Basic Metabolic Panel:  Recent Labs Lab 09/17/12 0252 09/17/12 0818  09/19/12 0350 09/20/12 0545 09/21/12 0445 09/22/12 0525 09/23/12 0345  NA 140 142  < > 137 133* 132* 132* 131*  K 4.1 5.0  < > 4.0 3.9 4.1 4.3 4.6  CL 110 108  < > 97 96 94* 96 97  CO2 22 22  < > 27 26 26 23 24   GLUCOSE 106* 107*  < > 147* 146* 121* 123* 144*  BUN 38* 38*  < > 53* 64* 82* 97* 103*  CREATININE 1.91* 1.97*  < > 2.45* 2.72* 3.59* 3.82* 3.37*  CALCIUM 9.1 9.1  < > 9.1 8.9 8.8 8.4 8.8  MG  --  2.3  --   --   --   --   --   --   PHOS  --   --   --   --   --   --   --  3.8  < > = values in this interval not  displayed.  Liver Function Tests:  Recent Labs Lab 09/17/12 0818 09/23/12 0345  AST 25  --   ALT 11  --   ALKPHOS 137*  --   BILITOT 0.4  --   PROT 8.1  --   ALBUMIN 3.2* 2.5*   No results found for this basename: LIPASE, AMYLASE,  in the last 168 hours No results found for this basename: AMMONIA,  in the last 168 hours  CBC:  Recent Labs Lab 09/17/12 0252 09/17/12 0818 09/19/12 0350  WBC 5.4 6.0 7.0  NEUTROABS  --  3.9  --   HGB 7.4* 8.3* 8.1*  HCT 22.4* 25.7* 24.5*  MCV 92.6 94.5 90.7  PLT 256 PLATELET CLUMPS NOTED ON SMEAR, COUNT APPEARS ADEQUATE 219    Cardiac Enzymes:  Recent Labs Lab 09/17/12 0819 09/17/12 1217  TROPONINI <0.30 <0.30    BNP: No components found with this basename: POCBNP,   CBG:  Recent Labs Lab 09/22/12 0622 09/22/12 1107 09/22/12 1620 09/22/12 2111 09/23/12 0559  GLUCAP 129* 212* 191* 180* 140*    Microbiology: Results for orders placed during  the hospital encounter of 04/29/12  URINE CULTURE     Status: None   Collection Time    04/29/12  9:55 AM      Result Value Range Status   Specimen Description URINE, CLEAN CATCH   Final   Special Requests NONE   Final   Culture  Setup Time 04/29/2012 19:06   Final   Colony Count >=100,000 COLONIES/ML   Final   Culture ESCHERICHIA COLI   Final   Report Status 05/01/2012 FINAL   Final   Organism ID, Bacteria ESCHERICHIA COLI   Final    Coagulation Studies: No results found for this basename: LABPROT, INR,  in the last 72 hours  Urinalysis:  Recent Labs  09/22/12 1520  COLORURINE YELLOW  LABSPEC 1.016  PHURINE 8.5*  GLUCOSEU NEGATIVE  HGBUR NEGATIVE  BILIRUBINUR NEGATIVE  KETONESUR NEGATIVE  PROTEINUR 30*  UROBILINOGEN 1.0  NITRITE NEGATIVE  LEUKOCYTESUR LARGE*      Imaging: US Renal  09/22/2012   *RADIOLOGY REPORT*  Clinical Data:  Diabetic, hypertensive with worsening renal function  RENAL/URINARY TRACT ULTRASOUND COMPLETE  Comparison:  Prior CT  abdomen/pelvis 04/29/2012.  Findings:  Right Kidney:  Normal in size (10.6 cm) and parenchymal echogenicity.  No evidence of mass or hydronephrosis.  Left Kidney:  Normal in size (12.4 cm) and parenchymal echogenicity.  No evidence of mass or hydronephrosis.  Bladder:  Appears normal for degree of bladder distention.  Other:  Small left pleural effusion.  IMPRESSION:  1.  No hydronephrosis or acute renal abnormality. 2.  Small left pleural effusion.   Original Report Authenticated By: Malachy Moan, M.D.     Medications:     . aspirin EC  81 mg Oral Daily  . cilostazol  100 mg Oral BID  . darbepoetin (ARANESP) injection - NON-DIALYSIS  40 mcg Subcutaneous Weekly  . ferrous sulfate  325 mg Oral TID WC  . heparin  5,000 Units Subcutaneous Q8H  . metoprolol succinate  25 mg Oral Daily  . niacin  500 mg Oral QHS  . sodium chloride  3 mL Intravenous Q12H  . tamsulosin  0.4 mg Oral Daily   sodium chloride, acetaminophen, levalbuterol, loperamide, ondansetron (ZOFRAN) IV, senna-docusate, sodium chloride, temazepam  Assessment/ Plan:  Acute on Chronic renal failure. Baseline at 2 mg/dl. Increased with diuresis but also with hypotension. No evidence of hydronephrosis on ultrasound . Urinalysis appears like a UTI.Awaiting culture ATN or prerenal azotemia.   Acute on Chronic renal failure. Baseline at 2 mg/dl. Increased with diuresis but also with hypotension.  HTN stopped Norvasc  Blood pressure improved Anemia Started aranesp mildly iron deficient  Bone check PTH and phosphorus        LOS: 6 [name] W @TODAY @10 :14 AM

## 2012-09-23 NOTE — Progress Notes (Signed)
Subjective:  No real complaints, not short of breath, wants to go home  Objective:  Vital Signs in the last 24 hours: BP 126/70  Pulse 83  Temp(Src) 98.3 F (36.8 C) (Oral)  Resp 20  Ht 5\' 9"  (1.753 m)  Wt 100.3 kg (221 lb 1.9 oz)  BMI 32.64 kg/m2  SpO2 94%  Physical Exam: Pale, no distress Lungs:  Decreased breath sounds at bases Cardiac:  Irregular rhythm Extremities:  No edema present  Intake/Output from previous day: 06/14 0701 - 06/15 0700 In: 600 [P.O.:600] Out: 1 [Stool:1] Weight Filed Weights   09/21/12 0647 09/22/12 0534 09/23/12 0436  Weight: 98.022 kg (216 lb 1.6 oz) 100.8 kg (222 lb 3.6 oz) 100.3 kg (221 lb 1.9 oz)    Lab Results: Basic Metabolic Panel:  Recent Labs  82/95/62 0525 09/23/12 0345  NA 132* 131*  K 4.3 4.6  CL 96 97  CO2 23 24  GLUCOSE 123* 144*  BUN 97* 103*  CREATININE 3.82* 3.37*    BNP    Component Value Date/Time   PROBNP 2569.0* 09/17/2012 0819   Telemetry: Atrial fibrillation with controlled response  Assessment/Plan:  1. Acute on chronic diastolic heart failure - resolved  2. Acute on chronic kidney disease appears to be improving somewhat. 3. Chronic atrial fibrillation rate controlled  Rec:  Major problem is worsening renal failure, but this is stabilizing. And. Atrial fibrillation rate is controlled and would continue to hold diuretics.   Darden Palmer  MD [facility]  09/23/2012, 10:56 AM

## 2012-09-24 LAB — RENAL FUNCTION PANEL
Albumin: 2.4 g/dL — ABNORMAL LOW (ref 3.5–5.2)
Calcium: 9 mg/dL (ref 8.4–10.5)
Chloride: 102 mEq/L (ref 96–112)
Creatinine, Ser: 2.73 mg/dL — ABNORMAL HIGH (ref 0.50–1.35)
GFR calc Af Amer: 22 mL/min — ABNORMAL LOW (ref >90)
GFR calc non Af Amer: 19 mL/min — ABNORMAL LOW (ref >90)
Sodium: 135 mEq/L (ref 135–145)

## 2012-09-24 LAB — GLUCOSE, CAPILLARY: Glucose-Capillary: 180 mg/dL — ABNORMAL HIGH (ref 70–99)

## 2012-09-24 MED ORDER — GLIPIZIDE ER 2.5 MG PO TB24: 2.5000 mg | ORAL_TABLET | Freq: Every day | ORAL | Status: DC

## 2012-09-24 MED ORDER — CYANOCOBALAMIN 1000 MCG/ML IJ SOLN: 1000.0000 ug | INTRAMUSCULAR | Status: DC

## 2012-09-24 MED ORDER — GLIPIZIDE ER 2.5 MG PO TB24
2.5000 mg | ORAL_TABLET | Freq: Every day | ORAL | Status: DC
  Filled 2012-09-24: qty 1

## 2012-09-24 MED ORDER — TEMAZEPAM 7.5 MG PO CAPS: 7.5000 mg | ORAL_CAPSULE | Freq: Every evening | ORAL | Status: DC | PRN

## 2012-09-24 NOTE — Progress Notes (Signed)
Physical Therapy Treatment Patient Details Name: [name] MRN: [id number] DOB: December 30, 1917 Today's Date: 09/24/2012 Time: 4098-1191 PT Time Calculation (min): 23 min  PT Assessment / Plan / Recommendation Comments on Treatment Session  Pt able to increase ambulation distance today.      Follow Up Recommendations  SNF;Other (comment)     Does the patient have the potential to tolerate intense rehabilitation     Barriers to Discharge        Equipment Recommendations  None recommended by PT    Recommendations for Other Services    Frequency Min 3X/week   Plan Discharge plan remains appropriate;Frequency remains appropriate    Precautions / Restrictions Restrictions Weight Bearing Restrictions: No       Mobility  Bed Mobility Bed Mobility: Supine to Sit;Sitting - Scoot to Edge of Bed Supine to Sit: 4: Min guard Sitting - Scoot to [place] of Bed: 4: Min guard Details for Bed Mobility Assistance: Increased time but able to complete without physical (A).   Transfers Transfers: Sit to Stand;Stand to Sit Sit to Stand: 4: Min assist;With upper extremity assist;From bed;With armrests;From chair/3-in-1 Stand to Sit: 4: Min assist;With upper extremity assist;With armrests;To chair/3-in-1 Details for Transfer Assistance: cues for hand placement.  (A) to achieve standing & controlled descent.   Ambulation/Gait Ambulation/Gait Assistance: 4: Min assist Ambulation Distance (Feet): 60 Feet Assistive device: Rolling walker Ambulation/Gait Assistance Details: Cues for tall posture, body positioning inside RW.  (A) for balanace due to weakness.   Gait Pattern: Step-through pattern;Decreased stride length;Trunk flexed Gait velocity: decreased      PT Goals Acute Rehab PT Goals Time For Goal Achievement: 10/02/12 Potential to Achieve Goals: Good Pt will go Supine/Side to Sit: with modified independence;with HOB 0 degrees PT Goal: Supine/Side to Sit - Progress: Progressing toward  goal Pt will go Sit to Stand: with modified independence PT Goal: Sit to Stand - Progress: Progressing toward goal Pt will Transfer Bed to Chair/Chair to Bed: with modified independence Pt will Ambulate: 16 - 50 feet;with modified independence;with least restrictive assistive device;with rolling walker PT Goal: Ambulate - Progress: Progressing toward goal Pt will Go Up / Down Stairs: 6-9 stairs;with supervision;with least restrictive assistive device;with rail(s)  Visit Information  Last PT Received On: 09/24/12 Assistance Needed: +1    Subjective Data      Cognition  Cognition Arousal/Alertness: Awake/alert Behavior During Therapy: WFL for tasks assessed/performed Overall Cognitive Status: Within Functional Limits for tasks assessed    Balance     End of Session PT - End of Session Equipment Utilized During Treatment: Gait belt Activity Tolerance: Patient tolerated treatment well Patient left: in chair;with call bell/phone within reach;with family/visitor present Nurse Communication: Mobility status     [name], [place] [phone] 09/24/2012

## 2012-09-24 NOTE — Progress Notes (Signed)
Subjective: No new complaints  Objective: Vital signs in last 24 hours: Temp:  [97.8 F (36.6 C)-98.8 F (37.1 C)] 98.1 F (36.7 C) (06/16 0653) Pulse Rate:  [79-90] 82 (06/16 0653) Resp:  [20] 20 (06/16 0653) BP: (118-126)/(37-70) 126/69 mmHg (06/16 0653) SpO2:  [93 %-97 %] 97 % (06/16 0653) Weight:  [99.6 kg (219 lb 9.3 oz)] 99.6 kg (219 lb 9.3 oz) (06/16 0653) Weight change: -0.7 kg (-1 lb 8.7 oz) Last BM Date: 09/22/12  Intake/Output from previous day: 06/15 0701 - 06/16 0700 In: 600 [P.O.:600] Out: -  Intake/Output this shift:    General appearance: alert and cooperative Resp: clear to auscultation bilaterally Cardio: irregularly irregular rhythm Extremities: extremities normal, atraumatic, no cyanosis or edema  Lab Results: No results found for this basename: WBC, HGB, HCT, PLT,  in the last 72 hours BMET  Recent Labs  09/23/12 0345 09/24/12 0530  NA 131* 135  K 4.6 4.5  CL 97 102  CO2 24 25  GLUCOSE 144* 129*  BUN 103* 89*  CREATININE 3.37* 2.73*  CALCIUM 8.8 9.0    Studies/Results: US Renal  09/22/2012   *RADIOLOGY REPORT*  Clinical Data:  Diabetic, hypertensive with worsening renal function  RENAL/URINARY TRACT ULTRASOUND COMPLETE  Comparison:  Prior CT abdomen/pelvis 04/29/2012.  Findings:  Right Kidney:  Normal in size (10.6 cm) and parenchymal echogenicity.  No evidence of mass or hydronephrosis.  Left Kidney:  Normal in size (12.4 cm) and parenchymal echogenicity.  No evidence of mass or hydronephrosis.  Bladder:  Appears normal for degree of bladder distention.  Other:  Small left pleural effusion.  IMPRESSION:  1.  No hydronephrosis or acute renal abnormality. 2.  Small left pleural effusion.   Original Report Authenticated By: Malachy Moan, M.D.    Medications: I have reviewed the patient's current medications.  Assessment/Plan: A/C CHF exac of Diastolic Heart failure- stable- off diuretics  A/C Renal Failure- continues to improve, holding  diuretics  BP - ok- off norvasc  Afib rate control on BB  Insomnia- add Tamezepam 15 mg qhs prn  SNF- ready for discharge  DM - blood sugars rising, add small dose glipizide Anemia- on aranesp, iiron and b12- ACD/multifactorial   LOS: 7 days   [name] [name] 09/24/2012, 7:15 AM

## 2012-09-24 NOTE — Progress Notes (Signed)
Subjective:  Plan for discharge to SNF today Remains off diuretics  Objective Vital signs in last 24 hours: Filed Vitals:   09/23/12 0952 09/23/12 1457 09/23/12 2135 09/24/12 0653  BP: 126/70 122/37 118/46 126/69  Pulse: 83 90 79 82  Temp:  98.8 F (37.1 C) 97.8 F (36.6 C) 98.1 F (36.7 C)  TempSrc:  Oral Oral Oral  Resp:  20 20 20   Height:      Weight:    99.6 kg (219 lb 9.3 oz)  SpO2:  93% 96% 97%   Weight change: -0.7 kg (-1 lb 8.7 oz)  Intake/Output Summary (Last 24 hours) at 09/24/12 1038 Last data filed at 09/24/12 0848  Gross per 24 hour  Intake    840 ml  Output      0 ml  Net    840 ml   Physical Exam:  Blood pressure 126/69, pulse 82, temperature 98.1 F (36.7 C), temperature source Oral, resp. rate 20, height 5\' 9"  (1.753 m), weight 99.6 kg (219 lb 9.3 oz), SpO2 97.00%. Gruff older gentleman Lying flat, no dyspnea Lungs clear Abd soft Wearing diaper/incontinent of urine No edema of LE's  Labs: Basic Metabolic Panel:  Recent Labs Lab 09/18/12 0555 09/19/12 0350 09/20/12 0545 09/21/12 0445 09/22/12 0525 09/23/12 0345 09/24/12 0530  NA 139 137 133* 132* 132* 131* 135  K 4.5 4.0 3.9 4.1 4.3 4.6 4.5  CL 103 97 96 94* 96 97 102  CO2 27 27 26 26 23 24 25   GLUCOSE 109* 147* 146* 121* 123* 144* 129*  BUN 45* 53* 64* 82* 97* 103* 89*  CREATININE 2.28* 2.45* 2.72* 3.59* 3.82* 3.37* 2.73*  CALCIUM 8.9 9.1 8.9 8.8 8.4 8.8 9.0  PHOS  --   --   --   --   --  3.8 3.1    Recent Labs Lab 09/23/12 0345 09/24/12 0530  ALBUMIN 2.5* 2.4*   Recent Labs Lab 09/19/12 0350  WBC 7.0  HGB 8.1*  HCT 24.5*  MCV 90.7  PLT 219    Recent Labs Lab 09/17/12 1217  TROPONINI <0.30   CBG:  Recent Labs Lab 09/23/12 0559 09/23/12 1109 09/23/12 1601 09/23/12 2121 09/24/12 0555  GLUCAP 140* 267* 245* 269* 127*    Iron Studies:  Recent Labs Lab 09/17/12 1616  IRON 51  TIBC 360  FERRITIN 24   Studies/Results: US Renal  09/22/2012   *RADIOLOGY  REPORT*  Clinical Data:  Diabetic, hypertensive with worsening renal function  RENAL/URINARY TRACT ULTRASOUND COMPLETE  Comparison:  Prior CT abdomen/pelvis 04/29/2012.  Findings:  Right Kidney:  Normal in size (10.6 cm) and parenchymal echogenicity.  No evidence of mass or hydronephrosis.  Left Kidney:  Normal in size (12.4 cm) and parenchymal echogenicity.  No evidence of mass or hydronephrosis.  Bladder:  Appears normal for degree of bladder distention.  Other:  Small left pleural effusion.  IMPRESSION:  1.  No hydronephrosis or acute renal abnormality. 2.  Small left pleural effusion.   Original Report Authenticated By: Malachy Moan, M.D.   Medications:   . aspirin EC  81 mg Oral Daily  . cilostazol  100 mg Oral BID  . darbepoetin (ARANESP) injection - NON-DIALYSIS  40 mcg Subcutaneous Weekly  . ferrous sulfate  325 mg Oral TID WC  . [START ON 09/25/2012] glipiZIDE  2.5 mg Oral Q breakfast  . heparin  5,000 Units Subcutaneous Q8H  . insulin aspart  0-5 Units Subcutaneous QHS  . insulin aspart  0-9  Units Subcutaneous TID WC  . metoprolol succinate  25 mg Oral Daily  . niacin  500 mg Oral QHS  . sodium chloride  3 mL Intravenous Q12H  . tamsulosin  0.4 mg Oral Daily    I  have reviewed scheduled and prn medications. ASSESSMENT/RECOMMENDATIONS  79 yo man with DM, HTN, anemia, afib, baseline CKD (BL creat around 2)  who developed non oliguric AKI on CKD in the setting of diuresis for acute diastolic CHF, with some transient hypotension.    Off diuretics for several days, creatinine peaked at 3.82, now down to 2.73 with no clinical CHF at this time, with weight stable right around 100 kg for past 3 days. I agree that he could be discharged. Will need to go back on 40 mg furosemide (his pre admission dosage) at the SNF in next several days Have spoken with Dr. Valentina Lucks who will include these instructions in the discharge summary.  Camille Bal, MD [facility] [phone]  pager 09/24/2012, 10:38 AM

## 2012-09-24 NOTE — Progress Notes (Signed)
[name] to be D/C'd Skilled nursing facility per MD order.  Discussed with the patient and all questions fully answered.    Medication List    STOP taking these medications       amLODipine 10 MG tablet  Commonly known as:  NORVASC     cilostazol 100 MG tablet  Commonly known as:  PLETAL     furosemide 40 MG tablet  Commonly known as:  LASIX     glipiZIDE 10 MG tablet  Commonly known as:  GLUCOTROL     lisinopril 20 MG tablet  Commonly known as:  PRINIVIL,ZESTRIL      TAKE these medications       aspirin EC 81 MG tablet  Take 81 mg by mouth every morning.     beta carotene w/minerals tablet  Take 1 tablet by mouth every morning.     cyanocobalamin 1000 MCG/ML injection  Commonly known as:  (VITAMIN B-12)  Inject 1 mL (1,000 mcg total) into the muscle every 7 (seven) days. 1000 mcg weekly for 4 doses, then 1000 mcg monthly     ferrous sulfate 325 (65 FE) MG tablet  Take 1 tablet (325 mg total) by mouth 2 (two) times daily with a meal.     glipiZIDE 2.5 MG 24 hr tablet  Commonly known as:  GLUCOTROL XL  Take 1 tablet (2.5 mg total) by mouth daily with breakfast.  Start taking on:  09/25/2012     metoprolol succinate 25 MG 24 hr tablet  Commonly known as:  TOPROL-XL  Take 25 mg by mouth daily after breakfast.     niacin 500 MG CR tablet  Commonly known as:  NIASPAN  Take 500 mg by mouth at bedtime.     tamsulosin 0.4 MG Caps  Commonly known as:  FLOMAX  Take 0.4 mg by mouth daily after breakfast.     temazepam 7.5 MG capsule  Commonly known as:  RESTORIL  Take 1 capsule (7.5 mg total) by mouth at bedtime as needed for sleep.        VVS, Skin clean, dry and intact without evidence of skin break down, no evidence of skin tears noted. IV catheter discontinued intact. Site without signs and symptoms of complications. Dressing and pressure applied.  An After Visit Summary was printed and given to the patient. Patient tranferred to SNF via ambulance  and stretcher.   [name] 09/24/2012 3:57 PM

## 2012-09-24 NOTE — Progress Notes (Signed)
Daughter called to have nurse follow up with Dr.Griffin to see if patient can be evaluated by nephrology (Dr. Hyman Hopes) prior to being discharged. Spoke with Dr. Valentina Lucks and confirmed that patient not to leave/discharge until nephrology sees patient. Notified on-call nephrologist for Dr. Hyman Hopes, Dr. Eliott Nine, of this and stated they will see them today before patient is discharged. Will continue to monitor patient to end of shift.

## 2012-09-24 NOTE — Progress Notes (Signed)
Called [facility] to give report on 3 different occassions and at each time the line is busy. Patient is being transferred to [facility] via ambulance and wil arrive there shortly/

## 2012-09-25 LAB — URINE CULTURE

## 2012-09-25 NOTE — Progress Notes (Signed)
UR chart review completed.  

## 2012-09-26 ENCOUNTER — Non-Acute Institutional Stay (SKILLED_NURSING_FACILITY): Payer: Medicare HMO | Admitting: Adult Health

## 2012-09-26 ENCOUNTER — Encounter: Payer: Self-pay | Admitting: Adult Health

## 2012-09-26 DIAGNOSIS — I4891 Unspecified atrial fibrillation: Secondary | ICD-10-CM

## 2012-09-26 DIAGNOSIS — I10 Essential (primary) hypertension: Secondary | ICD-10-CM

## 2012-09-26 DIAGNOSIS — I509 Heart failure, unspecified: Secondary | ICD-10-CM

## 2012-09-26 DIAGNOSIS — I5022 Chronic systolic (congestive) heart failure: Secondary | ICD-10-CM

## 2012-09-26 DIAGNOSIS — I5031 Acute diastolic (congestive) heart failure: Secondary | ICD-10-CM

## 2012-09-26 DIAGNOSIS — D649 Anemia, unspecified: Secondary | ICD-10-CM

## 2012-09-26 DIAGNOSIS — E119 Type 2 diabetes mellitus without complications: Secondary | ICD-10-CM

## 2012-09-26 NOTE — Progress Notes (Signed)
  Subjective:    Patient ID: [name], male    DOB: 02-06-1918, 79 y.o.   MRN: [id number]  HPI This is a 79 year old male who has been admitted to [facility] on 09/24/12 from [facility] with principal diagnosis of Acute diastolic CHF. He has been admitted for a short-term rehabilitation. It was reported that patient had BPs 183/89 last night and 170/49 the other day. No complaints of headache nor chest pain.   Review of Systems  Constitutional: Negative.   HENT: Negative.   Eyes: Negative.   Respiratory: Negative for cough, shortness of breath and wheezing.   Cardiovascular: Negative for chest pain, palpitations and leg swelling.  Gastrointestinal: Negative for abdominal distention.  Endocrine: Negative.   Genitourinary: Negative.   Neurological: Negative.   Hematological: Negative for adenopathy. Does not bruise/bleed easily.  Psychiatric/Behavioral: Negative.        Objective:   Physical Exam  Nursing note and vitals reviewed. Constitutional: He is oriented to person, place, and time. He appears well-developed and well-nourished.  HENT:  Head: Normocephalic and atraumatic.  Right Ear: External ear normal.  Left Ear: External ear normal.  Nose: Nose normal.  Mouth/Throat: Oropharynx is clear and moist.  Eyes: Conjunctivae and EOM are normal. Pupils are equal, round, and reactive to light.  Neck: Normal range of motion. Neck supple. No thyromegaly present.  Cardiovascular: Normal rate.   Irregular heart rate  Pulmonary/Chest: Effort normal and breath sounds normal. No respiratory distress.  Abdominal: Soft. Bowel sounds are normal. He exhibits no distension.  Musculoskeletal: Normal range of motion. He exhibits no edema and no tenderness.  Neurological: He is alert and oriented to person, place, and time.  Skin: Skin is warm and dry.  Psychiatric: He has a normal mood and affect. His behavior is normal. Judgment and thought content normal.     LABS: 09/25/12  hgbA1c 6.1  bnp 109.7 09/24/12  NA 135  K 4.5  Glucose 129  BUN 89  Creatinine 2.73   09/19/12  Wbc 7.0  hgb 8.1  hct 24.5   Medications reviewed per [facility]     Assessment & Plan:   Heart failure, acute on chronic, systolic and diastolic - stable; weigh daily; notify MD/NP +- 5 lbs  Atrial fibrillation - rate-controlled  T2DM (type 2 diabetes mellitus) - hgbA1c  6.1 ; discontinue Glipizide and continue CBG monitoring x 1 week  Hypertension -  Continue Toprol XL 25 mg PO Q D and start Clonidine 0.1 mg 1 tab PO Q 8 hours PRN for SBP >=160; BP/HR Q shift x 1 week  Anemia - stable  Acute on chronic renal insufficiency - stable; check BMP in 1 week

## 2012-09-27 ENCOUNTER — Non-Acute Institutional Stay (SKILLED_NURSING_FACILITY): Payer: Medicare HMO | Admitting: Internal Medicine

## 2012-09-27 DIAGNOSIS — I4891 Unspecified atrial fibrillation: Secondary | ICD-10-CM

## 2012-09-27 DIAGNOSIS — E1129 Type 2 diabetes mellitus with other diabetic kidney complication: Secondary | ICD-10-CM

## 2012-09-27 DIAGNOSIS — I509 Heart failure, unspecified: Secondary | ICD-10-CM

## 2012-09-27 DIAGNOSIS — E538 Deficiency of other specified B group vitamins: Secondary | ICD-10-CM

## 2012-09-30 NOTE — Clinical Social Work Placement (Addendum)
    Clinical Social Work Department CLINICAL SOCIAL WORK PLACEMENT NOTE 09/30/2012  Patient:  [name], [name]  Account Number:  [id number] Admit date:  09/17/2012  Clinical Social Worker:  Lupita Leash Vi Biddinger, LCSWA  Date/time:  09/19/2012 03:00 PM  Clinical Social Work is seeking post-discharge placement for this patient at the following level of care:   SKILLED NURSING   (*CSW will update this form in Epic as items are completed)   09/19/2012  Patient/family provided with [facility] Department of Clinical Social Work's list of facilities offering this level of care within the geographic area requested by the patient (or if unable, by the patient's family).  09/19/2012  Patient/family informed of their freedom to choose among providers that offer the needed level of care, that participate in Medicare, Medicaid or managed care program needed by the patient, have an available bed and are willing to accept the patient.  09/19/2012  Patient/family informed of [facility]' ownership interest in [facility], as well as of the fact that they are under no obligation to receive care at this facility.  PASARR submitted to EDS on 09/19/2012 PASARR number received from EDS on 09/19/2012  FL2 transmitted to all facilities in geographic area requested by pt/family on  09/19/2012 FL2 transmitted to all facilities within larger geographic area on   Patient informed that his/her managed care company has contracts with or will negotiate with  certain facilities, including the following:     Patient/family informed of bed offers received:  09/24/2012 Patient chooses bed at [facility] Physician recommends and patient chooses bed at    Patient to be transferred to [facility] on  09/24/2012 Patient to be transferred to facility by ambulance Sharin Mons)  The following physician request were entered in Epic:   Additional Comments: 09/24/12  Patient and daughter are pleased wtih d/c plan to  SNF.  Notified SNF and pt's nurse of d/c report wll be provided to facility. No further CSW need identified. CSW signing off.  Lorri Frederick. Deonta Bomberger, LCSWA  [phone]

## 2012-09-30 NOTE — Clinical Social Work Psychosocial (Addendum)
    Clinical Social Work Department BRIEF PSYCHOSOCIAL ASSESSMENT 09/30/2012  Patient:  [name], [name]     Account Number:  [id number]     Admit date:  09/17/2012  Clinical Social Worker:  Tiburcio Pea  Date/Time:  09/19/2012 10:30 AM  Referred by:  Physician  Date Referred:  09/19/2012 Referred for  SNF Placement   Other Referral:   Interview type:  Other - See comment Other interview type:   Patient and daughter [name]    PSYCHOSOCIAL DATA Living Status:  WITH ADULT CHILDREN Admitted from facility:   Level of care:   Primary support name:  [name]  (c) [phone]  [facility] Primary support relationship to patient:  CHILD, ADULT Degree of support available:   Strong invovlement and support    CURRENT CONCERNS Current Concerns  Post-Acute Placement   Other Concerns:    SOCIAL WORK ASSESSMENT / PLAN CSW met with patient and his daugher [name] today. Per Physical Therapy- SNF for short term rehab is recommended. CSW discussed with patient and his daughter- they are agreeable to same. SNF list discussed and they provided their placement  preferrences. Daughter hopes for placement at [facility] of [address].  Will consider other choices if they are unable to assist patient.  Fl2 placed on chart for MD's signature.   Assessment/plan status:  Psychosocial Support/Ongoing Assessment of Needs Other assessment/ plan:   Information/referral to community resources:   SNF list provided to patient and daughter. They stated preferences.    PATIENT'S/FAMILY'S RESPONSE TO PLAN OF CARE: Patient is alert and pleasant- he is agreeable to short term SNF placement and defers decision to his daughter [name] who is very active and invovled in his care. She feels that short term placement is very appropriate for patient.  CSW will assist wth placement for rehab.

## 2012-10-14 IMAGING — CR DG CHEST 2V
2 series · 2 of 2 positions shown · non-contrast
Comparison: Chest x-ray of 04/17/2010

CLINICAL DATA: Some shortness of breath and weakness, preop

CHEST - 2 VIEW

[w chest pa]
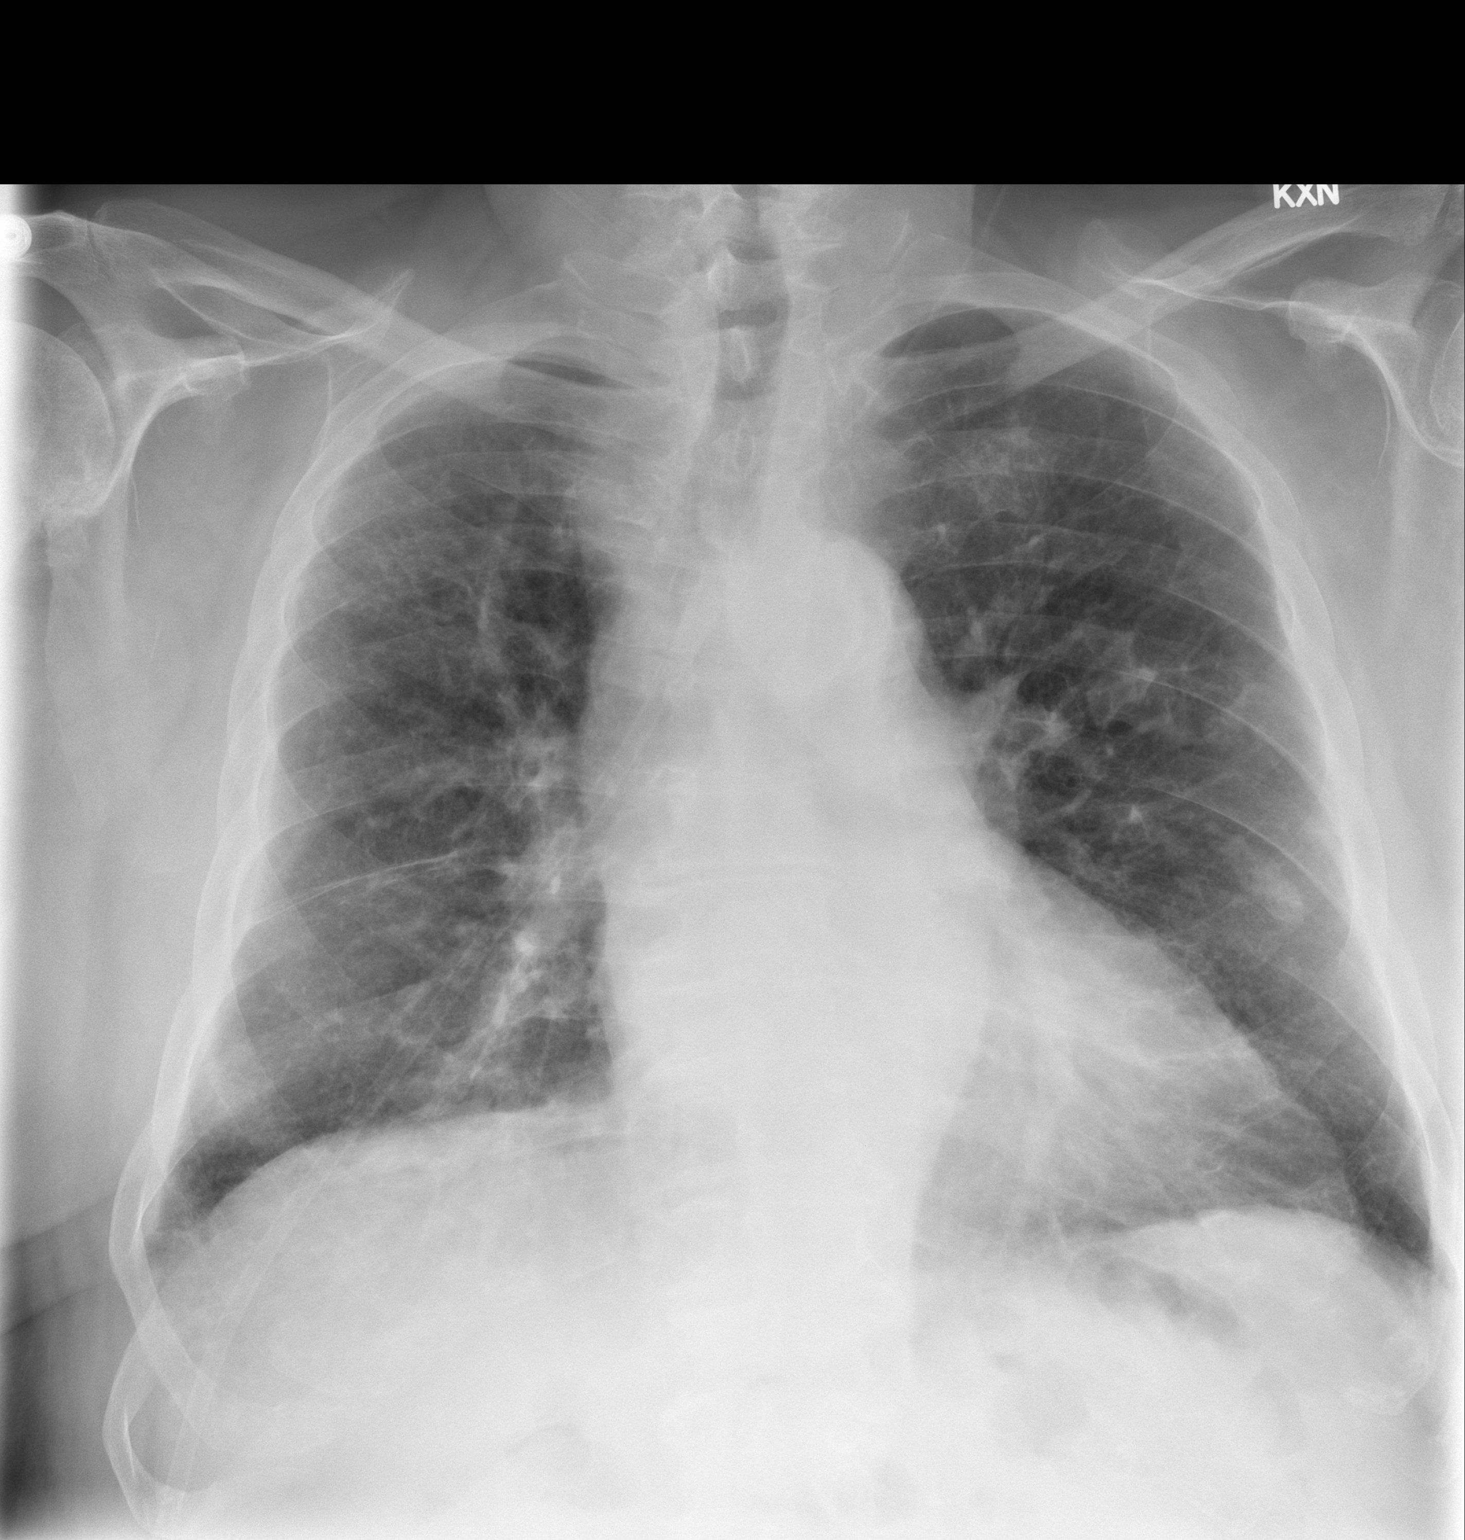

[w chest lat]
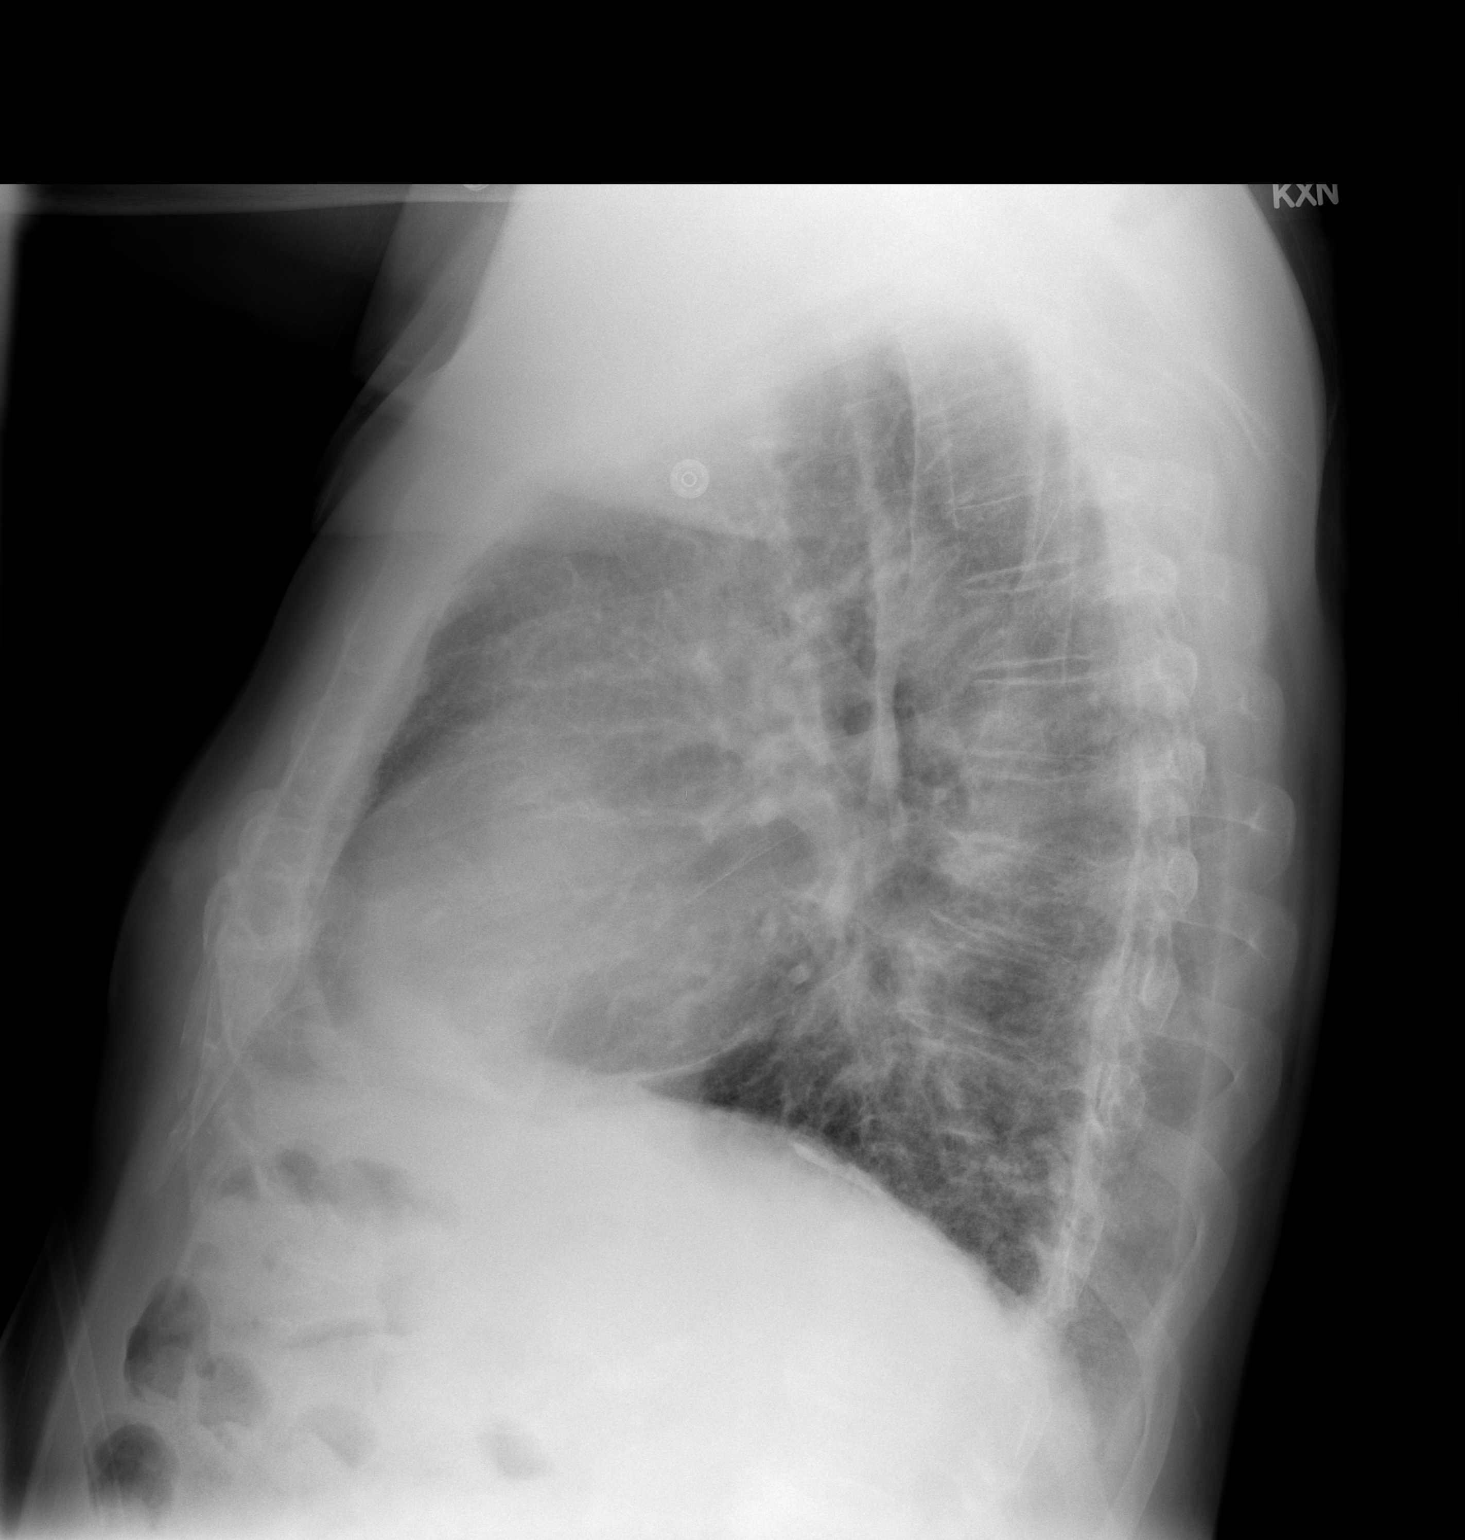

[2 of 2 positions shown; findings below may reference images not displayed]

FINDINGS: There is a nodular opacity at the left lung base
laterally.  Compared with the prior CT, this is consistent with
calcified pleural plaque. There is also calcification of the left
hemidiaphragm consistent with changes of asbestosis.  No active
infiltrate or effusion is seen.  Prominent markings at the lung
bases are consistent with mild basilar fibrosis.  Cardiomegaly is
stable. Mediastinal contours are stable.  No bony abnormality is
seen other than degenerative change in the thoracic spine.
IMPRESSION: 1.  Stable chest x-ray with basilar fibrosis and changes of
asbestosis with calcified pleural plaques and calcified
hemidiaphragm.
2.  Stable cardiomegaly.

## 2012-10-15 ENCOUNTER — Non-Acute Institutional Stay (SKILLED_NURSING_FACILITY): Payer: Medicare HMO | Admitting: Adult Health

## 2012-10-15 DIAGNOSIS — R404 Transient alteration of awareness: Secondary | ICD-10-CM

## 2012-10-15 DIAGNOSIS — R400 Somnolence: Secondary | ICD-10-CM

## 2012-10-17 ENCOUNTER — Non-Acute Institutional Stay (SKILLED_NURSING_FACILITY): Payer: Medicare HMO | Admitting: Adult Health

## 2012-10-17 DIAGNOSIS — N390 Urinary tract infection, site not specified: Secondary | ICD-10-CM

## 2012-10-19 ENCOUNTER — Non-Acute Institutional Stay (SKILLED_NURSING_FACILITY): Payer: Medicare HMO | Admitting: Adult Health

## 2012-10-19 DIAGNOSIS — I10 Essential (primary) hypertension: Secondary | ICD-10-CM

## 2012-10-19 DIAGNOSIS — N390 Urinary tract infection, site not specified: Secondary | ICD-10-CM

## 2012-10-19 DIAGNOSIS — E119 Type 2 diabetes mellitus without complications: Secondary | ICD-10-CM

## 2012-10-19 DIAGNOSIS — I5032 Chronic diastolic (congestive) heart failure: Secondary | ICD-10-CM

## 2012-10-19 DIAGNOSIS — D649 Anemia, unspecified: Secondary | ICD-10-CM

## 2012-10-19 DIAGNOSIS — I4891 Unspecified atrial fibrillation: Secondary | ICD-10-CM

## 2012-10-24 ENCOUNTER — Encounter (HOSPITAL_COMMUNITY): Payer: Self-pay | Admitting: Emergency Medicine

## 2012-10-24 ENCOUNTER — Emergency Department (HOSPITAL_COMMUNITY): Payer: Medicare HMO

## 2012-10-24 ENCOUNTER — Emergency Department (HOSPITAL_COMMUNITY)
Admission: EM | Admit: 2012-10-24 | Discharge: 2012-10-24 | Disposition: A | Discharge status: Home / Self Care | Payer: Medicare HMO | Attending: Emergency Medicine | Admitting: Emergency Medicine

## 2012-10-24 DIAGNOSIS — E1129 Type 2 diabetes mellitus with other diabetic kidney complication: Secondary | ICD-10-CM | POA: Insufficient documentation

## 2012-10-24 DIAGNOSIS — Z8739 Personal history of other diseases of the musculoskeletal system and connective tissue: Secondary | ICD-10-CM | POA: Insufficient documentation

## 2012-10-24 DIAGNOSIS — R5381 Other malaise: Secondary | ICD-10-CM | POA: Insufficient documentation

## 2012-10-24 DIAGNOSIS — Z79899 Other long term (current) drug therapy: Secondary | ICD-10-CM | POA: Insufficient documentation

## 2012-10-24 DIAGNOSIS — E785 Hyperlipidemia, unspecified: Secondary | ICD-10-CM | POA: Insufficient documentation

## 2012-10-24 DIAGNOSIS — Z8669 Personal history of other diseases of the nervous system and sense organs: Secondary | ICD-10-CM | POA: Insufficient documentation

## 2012-10-24 DIAGNOSIS — N390 Urinary tract infection, site not specified: Secondary | ICD-10-CM | POA: Insufficient documentation

## 2012-10-24 DIAGNOSIS — I10 Essential (primary) hypertension: Secondary | ICD-10-CM | POA: Insufficient documentation

## 2012-10-24 DIAGNOSIS — Z87891 Personal history of nicotine dependence: Secondary | ICD-10-CM | POA: Insufficient documentation

## 2012-10-24 DIAGNOSIS — D649 Anemia, unspecified: Secondary | ICD-10-CM | POA: Insufficient documentation

## 2012-10-24 DIAGNOSIS — E119 Type 2 diabetes mellitus without complications: Secondary | ICD-10-CM | POA: Insufficient documentation

## 2012-10-24 DIAGNOSIS — Z7982 Long term (current) use of aspirin: Secondary | ICD-10-CM | POA: Insufficient documentation

## 2012-10-24 DIAGNOSIS — Z8673 Personal history of transient ischemic attack (TIA), and cerebral infarction without residual deficits: Secondary | ICD-10-CM | POA: Insufficient documentation

## 2012-10-24 DIAGNOSIS — Z8679 Personal history of other diseases of the circulatory system: Secondary | ICD-10-CM | POA: Insufficient documentation

## 2012-10-24 DIAGNOSIS — Z8551 Personal history of malignant neoplasm of bladder: Secondary | ICD-10-CM | POA: Insufficient documentation

## 2012-10-24 DIAGNOSIS — E538 Deficiency of other specified B group vitamins: Secondary | ICD-10-CM | POA: Insufficient documentation

## 2012-10-24 LAB — CBC
MCHC: 33.6 g/dL (ref 30.0–36.0)
Platelets: 279 10*3/uL (ref 150–400)
RDW: 20.8 % — ABNORMAL HIGH (ref 11.5–15.5)
WBC: 5.3 10*3/uL (ref 4.0–10.5)

## 2012-10-24 LAB — COMPREHENSIVE METABOLIC PANEL
ALT: 13 U/L (ref 0–53)
AST: 21 U/L (ref 0–37)
Albumin: 2.8 g/dL — ABNORMAL LOW (ref 3.5–5.2)
Alkaline Phosphatase: 120 U/L — ABNORMAL HIGH (ref 39–117)
Chloride: 106 mEq/L (ref 96–112)
Potassium: 4.6 mEq/L (ref 3.5–5.1)
Sodium: 137 mEq/L (ref 135–145)
Total Bilirubin: 0.5 mg/dL (ref 0.3–1.2)
Total Protein: 7.3 g/dL (ref 6.0–8.3)

## 2012-10-24 LAB — URINALYSIS, ROUTINE W REFLEX MICROSCOPIC
Bilirubin Urine: NEGATIVE
Glucose, UA: NEGATIVE mg/dL
Specific Gravity, Urine: 1.017 (ref 1.005–1.030)
Urobilinogen, UA: 1 mg/dL (ref 0.0–1.0)
pH: 6 (ref 5.0–8.0)

## 2012-10-24 LAB — PROTIME-INR: Prothrombin Time: 14 seconds (ref 11.6–15.2)

## 2012-10-24 LAB — URINE MICROSCOPIC-ADD ON

## 2012-10-24 LAB — TROPONIN I: Troponin I: <0.3 ng/mL (ref <0.30)

## 2012-10-24 MED ORDER — CIPROFLOXACIN HCL 250 MG PO TABS: 250.0000 mg | ORAL_TABLET | Freq: Two times a day (BID) | ORAL | Status: DC

## 2012-10-24 MED ORDER — CIPROFLOXACIN IN D5W 400 MG/200ML IV SOLN
400.0000 mg | Freq: Once | INTRAVENOUS | Status: AC
  Administered 2012-10-24: 400 mg via INTRAVENOUS
  Filled 2012-10-24: qty 200

## 2012-10-24 NOTE — ED Notes (Signed)
[facility] arrived to transport patient.  Daughter at bedside gave discharge instructions verbalized understanding.  Signature pad did not work.

## 2012-10-24 NOTE — ED Notes (Signed)
Pt arrives to ed via [facility] for altered mental status. ems called by home health RN. Pt seen yesterday for uti.  Pt caox4, pmsx4, nad.
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
## 2012-10-24 NOTE — ED Notes (Signed)
CBG 133 

## 2012-10-24 NOTE — ED Notes (Signed)
[place] sandwich, apple sauce, and chocolate mile given to patient
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
## 2012-10-25 LAB — URINE CULTURE: Culture: NO GROWTH

## 2012-10-28 ENCOUNTER — Encounter (HOSPITAL_COMMUNITY): Payer: Self-pay | Admitting: Internal Medicine

## 2012-10-28 ENCOUNTER — Inpatient Hospital Stay (HOSPITAL_COMMUNITY)
Admission: EM | Admit: 2012-10-28 | Discharge: 2012-11-02 | DRG: 315 | Disposition: A | Discharge status: Home Health | Payer: Medicare HMO | Attending: Internal Medicine | Admitting: Internal Medicine

## 2012-10-28 DIAGNOSIS — N179 Acute kidney failure, unspecified: Secondary | ICD-10-CM | POA: Diagnosis present

## 2012-10-28 DIAGNOSIS — N390 Urinary tract infection, site not specified: Secondary | ICD-10-CM | POA: Diagnosis present

## 2012-10-28 DIAGNOSIS — E119 Type 2 diabetes mellitus without complications: Secondary | ICD-10-CM

## 2012-10-28 DIAGNOSIS — E785 Hyperlipidemia, unspecified: Secondary | ICD-10-CM | POA: Diagnosis present

## 2012-10-28 DIAGNOSIS — I5042 Chronic combined systolic (congestive) and diastolic (congestive) heart failure: Secondary | ICD-10-CM | POA: Diagnosis present

## 2012-10-28 DIAGNOSIS — I739 Peripheral vascular disease, unspecified: Secondary | ICD-10-CM | POA: Diagnosis present

## 2012-10-28 DIAGNOSIS — D638 Anemia in other chronic diseases classified elsewhere: Secondary | ICD-10-CM | POA: Diagnosis present

## 2012-10-28 DIAGNOSIS — Z87440 Personal history of urinary (tract) infections: Secondary | ICD-10-CM

## 2012-10-28 DIAGNOSIS — H3530 Unspecified macular degeneration: Secondary | ICD-10-CM | POA: Diagnosis present

## 2012-10-28 DIAGNOSIS — I509 Heart failure, unspecified: Secondary | ICD-10-CM | POA: Diagnosis present

## 2012-10-28 DIAGNOSIS — Z8673 Personal history of transient ischemic attack (TIA), and cerebral infarction without residual deficits: Secondary | ICD-10-CM

## 2012-10-28 DIAGNOSIS — I959 Hypotension, unspecified: Principal | ICD-10-CM | POA: Diagnosis present

## 2012-10-28 DIAGNOSIS — N189 Chronic kidney disease, unspecified: Secondary | ICD-10-CM

## 2012-10-28 DIAGNOSIS — D494 Neoplasm of unspecified behavior of bladder: Secondary | ICD-10-CM | POA: Diagnosis present

## 2012-10-28 DIAGNOSIS — I4891 Unspecified atrial fibrillation: Secondary | ICD-10-CM | POA: Diagnosis present

## 2012-10-28 DIAGNOSIS — I5043 Acute on chronic combined systolic (congestive) and diastolic (congestive) heart failure: Secondary | ICD-10-CM | POA: Diagnosis present

## 2012-10-28 DIAGNOSIS — Z880 Allergy status to penicillin: Secondary | ICD-10-CM

## 2012-10-28 DIAGNOSIS — N289 Disorder of kidney and ureter, unspecified: Secondary | ICD-10-CM

## 2012-10-28 DIAGNOSIS — I129 Hypertensive chronic kidney disease with stage 1 through stage 4 chronic kidney disease, or unspecified chronic kidney disease: Secondary | ICD-10-CM | POA: Diagnosis present

## 2012-10-28 DIAGNOSIS — N476 Balanoposthitis: Secondary | ICD-10-CM | POA: Diagnosis present

## 2012-10-28 DIAGNOSIS — Z79899 Other long term (current) drug therapy: Secondary | ICD-10-CM

## 2012-10-28 DIAGNOSIS — I10 Essential (primary) hypertension: Secondary | ICD-10-CM | POA: Diagnosis present

## 2012-10-28 DIAGNOSIS — R55 Syncope and collapse: Secondary | ICD-10-CM | POA: Diagnosis present

## 2012-10-28 DIAGNOSIS — E1129 Type 2 diabetes mellitus with other diabetic kidney complication: Secondary | ICD-10-CM | POA: Diagnosis present

## 2012-10-28 LAB — CBC WITH DIFFERENTIAL/PLATELET
Hemoglobin: 7.7 g/dL — ABNORMAL LOW (ref 13.0–17.0)
Lymphocytes Relative: 15 % (ref 12–46)
Lymphs Abs: 0.8 10*3/uL (ref 0.7–4.0)
MCV: 95.9 fL (ref 78.0–100.0)
Monocytes Relative: 8 % (ref 3–12)
Neutrophils Relative %: 73 % (ref 43–77)
Platelets: 228 10*3/uL (ref 150–400)
RBC: 2.42 MIL/uL — ABNORMAL LOW (ref 4.22–5.81)
WBC: 5.5 10*3/uL (ref 4.0–10.5)

## 2012-10-28 LAB — CBC
MCH: 31.5 pg (ref 26.0–34.0)
Platelets: 247 10*3/uL (ref 150–400)
RBC: 2.67 MIL/uL — ABNORMAL LOW (ref 4.22–5.81)
WBC: 5.8 10*3/uL (ref 4.0–10.5)

## 2012-10-28 LAB — CG4 I-STAT (LACTIC ACID): Lactic Acid, Venous: 1.2 mmol/L (ref 0.5–2.2)

## 2012-10-28 LAB — GLUCOSE, CAPILLARY
Glucose-Capillary: 123 mg/dL — ABNORMAL HIGH (ref 70–99)
Glucose-Capillary: 161 mg/dL — ABNORMAL HIGH (ref 70–99)

## 2012-10-28 LAB — BASIC METABOLIC PANEL
CO2: 20 mEq/L (ref 19–32)
Glucose, Bld: 180 mg/dL — ABNORMAL HIGH (ref 70–99)
Potassium: 4.6 mEq/L (ref 3.5–5.1)
Sodium: 140 mEq/L (ref 135–145)

## 2012-10-28 LAB — URINALYSIS, ROUTINE W REFLEX MICROSCOPIC
Glucose, UA: NEGATIVE mg/dL
Specific Gravity, Urine: 1.014 (ref 1.005–1.030)
pH: 5.5 (ref 5.0–8.0)

## 2012-10-28 LAB — URINE MICROSCOPIC-ADD ON

## 2012-10-28 LAB — CREATININE, SERUM: Creatinine, Ser: 3.25 mg/dL — ABNORMAL HIGH (ref 0.50–1.35)

## 2012-10-28 LAB — VITAMIN B12: Vitamin B-12: 1499 pg/mL — ABNORMAL HIGH (ref 211–911)

## 2012-10-28 MED ORDER — TAMSULOSIN HCL 0.4 MG PO CAPS
0.4000 mg | ORAL_CAPSULE | Freq: Every day | ORAL | Status: DC
  Administered 2012-10-28 – 2012-11-02 (×6): 0.4 mg via ORAL
  Filled 2012-10-28 (×7): qty 1

## 2012-10-28 MED ORDER — NITROGLYCERIN 0.4 MG SL SUBL: 0.4000 mg | SUBLINGUAL_TABLET | SUBLINGUAL | Status: DC | PRN

## 2012-10-28 MED ORDER — HEPARIN SODIUM (PORCINE) 5000 UNIT/ML IJ SOLN
5000.0000 [IU] | Freq: Three times a day (TID) | INTRAMUSCULAR | Status: DC
  Administered 2012-10-28 – 2012-11-02 (×15): 5000 [IU] via SUBCUTANEOUS
  Filled 2012-10-28 (×19): qty 1

## 2012-10-28 MED ORDER — CEFTRIAXONE SODIUM 1 G IJ SOLR
1.0000 g | Freq: Once | INTRAMUSCULAR | Status: AC
  Administered 2012-10-28: 1 g via INTRAVENOUS
  Filled 2012-10-28: qty 10

## 2012-10-28 MED ORDER — METOPROLOL SUCCINATE ER 25 MG PO TB24
25.0000 mg | ORAL_TABLET | Freq: Every day | ORAL | Status: DC
  Administered 2012-10-28 – 2012-11-02 (×6): 25 mg via ORAL
  Filled 2012-10-28 (×7): qty 1

## 2012-10-28 MED ORDER — SODIUM CHLORIDE 0.9 % IV SOLN: INTRAVENOUS | Status: AC

## 2012-10-28 MED ORDER — INSULIN ASPART 100 UNIT/ML ~~LOC~~ SOLN
0.0000 [IU] | Freq: Three times a day (TID) | SUBCUTANEOUS | Status: DC
  Administered 2012-10-28: 3 [IU] via SUBCUTANEOUS
  Administered 2012-10-28 – 2012-10-29 (×5): 2 [IU] via SUBCUTANEOUS
  Administered 2012-10-30: 3 [IU] via SUBCUTANEOUS
  Administered 2012-10-30: 2 [IU] via SUBCUTANEOUS
  Administered 2012-10-30 – 2012-10-31 (×4): 3 [IU] via SUBCUTANEOUS

## 2012-10-28 MED ORDER — CILOSTAZOL 100 MG PO TABS
100.0000 mg | ORAL_TABLET | Freq: Two times a day (BID) | ORAL | Status: DC
  Administered 2012-10-28 – 2012-11-02 (×11): 100 mg via ORAL
  Filled 2012-10-28 (×12): qty 1

## 2012-10-28 MED ORDER — DEXTROSE 5 % IV SOLN
500.0000 mg | Freq: Three times a day (TID) | INTRAVENOUS | Status: DC
  Administered 2012-10-28 – 2012-10-31 (×10): 500 mg via INTRAVENOUS
  Filled 2012-10-28 (×14): qty 0.5

## 2012-10-28 MED ORDER — SENNOSIDES-DOCUSATE SODIUM 8.6-50 MG PO TABS
1.0000 | ORAL_TABLET | Freq: Every evening | ORAL | Status: DC | PRN
  Filled 2012-10-28: qty 1

## 2012-10-28 MED ORDER — ISOSORBIDE MONONITRATE ER 30 MG PO TB24
30.0000 mg | ORAL_TABLET | Freq: Every day | ORAL | Status: DC
  Administered 2012-10-28 – 2012-11-02 (×6): 30 mg via ORAL
  Filled 2012-10-28 (×6): qty 1

## 2012-10-28 MED ORDER — ACETAMINOPHEN 650 MG RE SUPP: 650.0000 mg | Freq: Four times a day (QID) | RECTAL | Status: DC | PRN

## 2012-10-28 MED ORDER — SODIUM CHLORIDE 0.9 % IV BOLUS (SEPSIS)
1000.0000 mL | Freq: Once | INTRAVENOUS | Status: AC
  Administered 2012-10-28: 1000 mL via INTRAVENOUS

## 2012-10-28 MED ORDER — INSULIN ASPART 100 UNIT/ML ~~LOC~~ SOLN: 0.0000 [IU] | Freq: Every day | SUBCUTANEOUS | Status: DC

## 2012-10-28 MED ORDER — SODIUM CHLORIDE 0.9 % IJ SOLN
3.0000 mL | Freq: Two times a day (BID) | INTRAMUSCULAR | Status: DC
  Administered 2012-10-28 – 2012-11-01 (×9): 3 mL via INTRAVENOUS

## 2012-10-28 MED ORDER — KETOCONAZOLE 2 % EX CREA
TOPICAL_CREAM | Freq: Two times a day (BID) | CUTANEOUS | Status: DC
  Administered 2012-10-28 – 2012-11-02 (×11): via TOPICAL
  Filled 2012-10-28: qty 15

## 2012-10-28 MED ORDER — ASPIRIN EC 81 MG PO TBEC
81.0000 mg | DELAYED_RELEASE_TABLET | Freq: Every morning | ORAL | Status: DC
  Administered 2012-10-28 – 2012-11-02 (×6): 81 mg via ORAL
  Filled 2012-10-28 (×6): qty 1

## 2012-10-28 MED ORDER — POLYSACCHARIDE IRON COMPLEX 150 MG PO CAPS
150.0000 mg | ORAL_CAPSULE | Freq: Every day | ORAL | Status: DC
  Administered 2012-10-28 – 2012-11-02 (×6): 150 mg via ORAL
  Filled 2012-10-28 (×6): qty 1

## 2012-10-28 MED ORDER — ACETAMINOPHEN 325 MG PO TABS
650.0000 mg | ORAL_TABLET | Freq: Four times a day (QID) | ORAL | Status: DC | PRN
  Administered 2012-10-31: 650 mg via ORAL
  Filled 2012-10-28: qty 2

## 2012-10-28 NOTE — Progress Notes (Signed)
Subjective: Pt awake/ denies any pain BP is better  Objective: Vital signs in last 24 hours: Temp:  [97.8 F (36.6 C)-98.3 F (36.8 C)] 97.8 F (36.6 C) (07/20 0640) Pulse Rate:  [72-94] 86 (07/20 0640) Resp:  [16-29] 18 (07/20 0640) BP: (101-145)/(48-70) 145/70 mmHg (07/20 0640) SpO2:  [95 %-100 %] 95 % (07/20 0640) Weight:  [100.4 kg (221 lb 5.5 oz)] 100.4 kg (221 lb 5.5 oz) (07/20 0640) Weight change:     Intake/Output from previous day:   Intake/Output this shift:    General appearance: alert Resp: clear to auscultation bilaterally Cardio: regular rate and rhythm GI: soft, non-tender; bowel sounds normal; no masses,  no organomegaly Male genitalia: swelling and redness on penile shaft  Lab Results:  Recent Labs  10/28/12 0041  WBC 5.5  HGB 7.7*  HCT 23.2*  PLT 228   BMET  Recent Labs  10/28/12 0041  NA 140  K 4.6  CL 110  CO2 20  GLUCOSE 180*  BUN 47*  CREATININE 3.63*  CALCIUM 8.7    Studies/Results: No results found.  Medications: I have reviewed the patient's current medications.  Assessment/Plan: Hypotension/ confusion/ UTI- continue ABX IV; f/u on culture- afebrile/ WBC  Normal A/C renal Failure- ABE and Lasix on hold- repeat Renal function- baseline cr 2.7 Anemia ACD-  Start on Iron- recheck CBC; if HGB lower - may need PRBC; check B12 level DM- hold glucotrol- SS insulin H/o CHF- BNP elevated- clinically not in failure- lasix on hold Afib Rate control Balanitis- ketoconazole cream Foley to get better I/O -urine output. PT consult Nutrition consult   LOS: 0 days   Nancy Arvin 10/28/2012, 8:22 AM

## 2012-10-28 NOTE — ED Notes (Signed)
EMS-EMS called out for seizure like activity. Family reports pt has been tx for UTI recently and tonight home health nurse put several blankets on pt and turned up the temperature in room when family pt checked on pt they reported possible seizure. Pt with no hx. No postictal period after seizure. 18g(L)FA posterior. Initial BP 80. Pt is not warm to the touch but clothes are drenched with sweat. Pt denies any complaints.
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
## 2012-10-28 NOTE — Progress Notes (Addendum)
79yo male had been treated for UTI as outpatient w/ Cipro, became hypotensive, now to change to Azactam.  Will begin Azactam 500mg  IV Q8H and monitor CBC, Cx, and clinical progression.  Vernard Gambles, PharmD, BCPS 10/28/2012 7:05 AM

## 2012-10-29 LAB — BASIC METABOLIC PANEL
CO2: 19 mEq/L (ref 19–32)
Calcium: 9.1 mg/dL (ref 8.4–10.5)
Creatinine, Ser: 2.32 mg/dL — ABNORMAL HIGH (ref 0.50–1.35)
GFR calc non Af Amer: 23 mL/min — ABNORMAL LOW (ref >90)
Sodium: 138 mEq/L (ref 135–145)

## 2012-10-29 LAB — GLUCOSE, CAPILLARY
Glucose-Capillary: 126 mg/dL — ABNORMAL HIGH (ref 70–99)
Glucose-Capillary: 159 mg/dL — ABNORMAL HIGH (ref 70–99)
Glucose-Capillary: 132 mg/dL — ABNORMAL HIGH (ref 70–99)

## 2012-10-29 LAB — CBC
MCH: 31.4 pg (ref 26.0–34.0)
MCHC: 32.6 g/dL (ref 30.0–36.0)
MCV: 96.2 fL (ref 78.0–100.0)
Platelets: 224 10*3/uL (ref 150–400)
RBC: 2.39 MIL/uL — ABNORMAL LOW (ref 4.22–5.81)
RDW: 21 % — ABNORMAL HIGH (ref 11.5–15.5)

## 2012-10-29 LAB — IRON AND TIBC: Iron: 60 ug/dL (ref 42–135)

## 2012-10-29 LAB — VITAMIN B12: Vitamin B-12: 1430 pg/mL — ABNORMAL HIGH (ref 211–911)

## 2012-10-29 LAB — URINE CULTURE
Colony Count: NO GROWTH
Culture: NO GROWTH

## 2012-10-29 LAB — FERRITIN: Ferritin: 52 ng/mL (ref 22–322)

## 2012-10-29 LAB — RETICULOCYTES: Retic Count, Absolute: 48.6 10*3/uL (ref 19.0–186.0)

## 2012-10-29 MED ORDER — DARBEPOETIN ALFA-POLYSORBATE 40 MCG/0.4ML IJ SOLN
40.0000 ug | INTRAMUSCULAR | Status: DC
  Administered 2012-10-31: 40 ug via SUBCUTANEOUS
  Filled 2012-10-29 (×3): qty 0.4

## 2012-10-29 NOTE — Progress Notes (Signed)
MEDICATION RELATED CONSULT NOTE - INITIAL   Pharmacy Consult for Aranesp Indication: Anemia of Chronic Disease  Allergies  Allergen Reactions  . Penicillins Swelling  . Sulfa Antibiotics Other (See Comments)    unknown    Patient Measurements: Height: 5\' 9"  (175.3 cm) Weight: 221 lb 5.5 oz (100.401 kg) IBW/kg (Calculated) : 70.7   Vital Signs: Temp: 98 F (36.7 C) (07/21 0456) Temp src: Oral (07/21 0456) BP: 151/57 mmHg (07/21 0456) Pulse Rate: 79 (07/21 0456) Intake/Output from previous day: 07/20 0701 - 07/21 0700 In: 960 [P.O.:960] Out: -  Intake/Output from this shift:    Labs:  Recent Labs  10/28/12 0041 10/28/12 0906 10/29/12 0430  WBC 5.5 5.8 4.7  HGB 7.7* 8.4* 7.5*  HCT 23.2* 25.3* 23.0*  PLT 228 247 224  CREATININE 3.63* 3.25* 2.32*   Estimated Creatinine Clearance: 23.7 ml/min (by C-G formula based on Cr of 2.32).   Microbiology: Recent Results (from the past 720 hour(s))  URINE CULTURE     Status: None   Collection Time    10/24/12  1:55 PM      Result Value Range Status   Specimen Description URINE, RANDOM   Final   Special Requests NONE   Final   Culture  Setup Time 10/24/2012 21:28   Final   Colony Count NO GROWTH   Final   Culture NO GROWTH   Final   Report Status 10/25/2012 FINAL   Final    Medical History: Past Medical History  Diagnosis Date  . Diabetes mellitus   . Hypertension   . Macular degeneration   . Bladder tumor 1998  . Cancer 2001    bladder  . Atrial fibrillation   . Peripheral vascular disease   . Hyperlipidemia   . Anemia   . TIA (transient ischemic attack)   . Low back pain     Assessment: [name] admitted for hypotension/confusion/UTI being treated with aztreonam with anemia of chronic disease. Pharmacy consulted to dose Aranesp. Last anemia panel done in June, so will obtain panel today for more updated levels. Dosing strategy of 0.80mcg/kg to be used for Aranesp. Hgb 7.5, Plts 224.  Goal of Therapy:   Increase of Hgb by no more than 1g/dL in a 2 week period  Plan:  1. Obtain anemia panel today 2. Start Aranesp subq QTuesday at 1800 x4 doses 3. Weekly reticulocyte counts 4. At least weekly CBCs  Chrystian Cupples D. Sundeep Cary, PharmD Clinical Pharmacist Pager: [phone] 10/29/2012 8:54 AM

## 2012-10-29 NOTE — Progress Notes (Signed)
Subjective: No complaints  Objective: Vital signs in last 24 hours: Temp:  [97.8 F (36.6 C)-98.6 F (37 C)] 98 F (36.7 C) (07/21 0456) Pulse Rate:  [79-91] 79 (07/21 0456) Resp:  [18] 18 (07/21 0456) BP: (102-151)/(50-70) 151/57 mmHg (07/21 0456) SpO2:  [95 %-98 %] 98 % (07/21 0456) Weight:  [100.4 kg (221 lb 5.5 oz)-100.401 kg (221 lb 5.5 oz)] 100.401 kg (221 lb 5.5 oz) (07/20 2034) Weight change: 0.001 kg (0 oz) Last BM Date: 10/27/12  Intake/Output from previous day: 07/20 0701 - 07/21 0700 In: 960 [P.O.:960] Out: -  Intake/Output this shift: Total I/O In: 240 [P.O.:240] Out: -   General appearance: alert and cooperative Resp: clear to auscultation bilaterally Cardio: irregularly irregular rhythm GI: soft, non-tender; bowel sounds normal; no masses,  no organomegaly Extremities: extremities normal, atraumatic, no cyanosis or edema  Lab Results:  Recent Labs  10/28/12 0906 10/29/12 0430  WBC 5.8 4.7  HGB 8.4* 7.5*  HCT 25.3* 23.0*  PLT 247 224   BMET  Recent Labs  10/28/12 0041 10/28/12 0906  NA 140  --   K 4.6  --   CL 110  --   CO2 20  --   GLUCOSE 180*  --   BUN 47*  --   CREATININE 3.63* 3.25*  CALCIUM 8.7  --     Studies/Results: No results found.  Medications: I have reviewed the patient's current medications.  Assessment/Plan: Hypotension/ confusion/ UTI- continue ABX IV; f/u on culture- afebrile/ WBC Normal  A/C renal Failure- ABE and Lasix on hold- repeat Renal function- baseline cr 2.7  Anemia ACD- Start on Iron- recheck CBC;give dose of aranesp DM- hold glucotrol- SS insulin, control IK H/o CHF- BNP elevated- clinically not in failure- lasix on hold  Afib Rate controlled Balanitis- ketoconazole cream  Foley to get better I/O -urine output.  PT consult  Nutrition consult     LOS: 1 day   [name] JOSEPH 10/29/2012, 6:31 AM

## 2012-10-30 ENCOUNTER — Inpatient Hospital Stay (HOSPITAL_COMMUNITY): Payer: Medicare HMO

## 2012-10-30 LAB — BASIC METABOLIC PANEL
BUN: 37 mg/dL — ABNORMAL HIGH (ref 6–23)
Creatinine, Ser: 2.16 mg/dL — ABNORMAL HIGH (ref 0.50–1.35)
GFR calc Af Amer: 29 mL/min — ABNORMAL LOW (ref >90)
GFR calc non Af Amer: 25 mL/min — ABNORMAL LOW (ref >90)
Potassium: 4.4 mEq/L (ref 3.5–5.1)

## 2012-10-30 LAB — CBC
Hemoglobin: 7.6 g/dL — ABNORMAL LOW (ref 13.0–17.0)
MCHC: 32.5 g/dL (ref 30.0–36.0)
RDW: 21 % — ABNORMAL HIGH (ref 11.5–15.5)

## 2012-10-30 LAB — GLUCOSE, CAPILLARY: Glucose-Capillary: 158 mg/dL — ABNORMAL HIGH (ref 70–99)

## 2012-10-30 MED ORDER — LISINOPRIL 10 MG PO TABS
10.0000 mg | ORAL_TABLET | Freq: Every day | ORAL | Status: DC
  Administered 2012-10-30 – 2012-11-02 (×4): 10 mg via ORAL
  Filled 2012-10-30 (×4): qty 1

## 2012-10-30 NOTE — Evaluation (Signed)
Physical Therapy Evaluation Patient Details Name: [name] MRN: [id number] DOB: [birth year] Today's Date: 10/30/2012 Time: 4098-1191 PT Time Calculation (min): 26 min  PT Assessment / Plan / Recommendation History of Present Illness  79 y.o. male with PMH of DM, HTN, A fib, PVD, and chronic anemia presented with episode of hypotension and syncope.  Clinical Impression  Pt admitted with above as well as UTI. Pt currently with functional limitations due to the deficits listed below (see PT Problem List).  Pt will benefit from skilled PT to increase their independence and safety with mobility to allow discharge to the venue listed below.      PT Assessment  Patient needs continued PT services    Follow Up Recommendations  SNF;Supervision/Assistance - 24 hour    Does the patient have the potential to tolerate intense rehabilitation      Barriers to Discharge        Equipment Recommendations       Recommendations for Other Services     Frequency Min 3X/week    Precautions / Restrictions Precautions Precautions: Fall Precaution Comments:   Restrictions Weight Bearing Restrictions: No   Pertinent Vitals/Pain n/a      Mobility  Bed Mobility Bed Mobility: Supine to Sit Supine to Sit: 3: Mod assist;With rails Details for Bed Mobility Assistance: mod assist for scooting hip to EOB with bed pad Transfers Transfers: Sit to Stand;Stand to Sit;Stand Pivot Transfers Sit to Stand: 4: Min assist;From chair/3-in-1;From toilet;From bed Stand to Sit: 4: Min assist;To chair/3-in-1;To toilet Stand Pivot Transfers: 3: Mod assist Details for Transfer Assistance: verbal cues for safe technique, pt reports not using RW at home however unsteady with rise so first marched in place a couple times then assisted to recliner with mod assist with 1 HHA, RW retrieved and used thereafter to improve stability Ambulation/Gait Ambulation/Gait Assistance: 4: Min assist Ambulation Distance  (Feet): 12 Feet (x2) Assistive device: Rolling walker Ambulation/Gait Assistance Details: verbal cues for safe use of RW, posture Gait Pattern: Step-through pattern;Decreased stride length;Trunk flexed Gait velocity: decreased    Exercises     PT Diagnosis: Difficulty walking;Generalized weakness  PT Problem List: Decreased strength;Decreased activity tolerance;Decreased mobility;Decreased knowledge of use of DME PT Treatment Interventions: DME instruction;Gait training;Functional mobility training;Therapeutic activities;Therapeutic exercise;Balance training;Neuromuscular re-education;Patient/family education     PT Goals(Current goals can be found in the care plan section) Acute Rehab PT Goals PT Goal Formulation: With patient Time For Goal Achievement: 11/13/12 Potential to Achieve Goals: Good  Visit Information  Last PT Received On: 10/30/12 Assistance Needed: +1 History of Present Illness: 78 y.o. male with PMH of DM, HTN, A fib, PVD, and chronic anemia presented with episode of hypotension and syncope.       Prior Functioning  Home Living Family/patient expects to be discharged to:: Private residence Living Arrangements: Alone Home Access: Stairs to enter Entrance Stairs-Number of Steps: 1 Home Layout: Able to live on main level with bedroom/bathroom Home Equipment: Walker - 2 wheels;Cane - single point Prior Function Level of Independence: Independent Communication Communication: No difficulties    Cognition  Cognition Arousal/Alertness: Awake/alert Behavior During Therapy: WFL for tasks assessed/performed Overall Cognitive Status: Within Functional Limits for tasks assessed    Extremity/Trunk Assessment Lower Extremity Assessment Lower Extremity Assessment: Generalized weakness Cervical / Trunk Assessment Cervical / Trunk Assessment: Kyphotic   Balance    End of Session PT - End of Session Equipment Utilized During Treatment: Gait belt Activity Tolerance:  Patient limited by fatigue Patient left:  in chair;with call bell/phone within reach  GP     [facility] E 10/30/2012, 12:38 PM Zenovia Jarred, PT, DPT 10/30/2012 Pager: [phone]

## 2012-10-30 NOTE — Progress Notes (Signed)
Utilization Review Completed.[name] T7/22/2014  

## 2012-10-30 NOTE — Progress Notes (Signed)
Subjective: No complaints  Objective: Vital signs in last 24 hours: Temp:  [98.3 F (36.8 C)-98.9 F (37.2 C)] 98.9 F (37.2 C) (07/22 0631) Pulse Rate:  [83-100] 100 (07/22 0631) Resp:  [18] 18 (07/22 0631) BP: (140-150)/(52-75) 150/56 mmHg (07/22 0631) SpO2:  [99 %-100 %] 100 % (07/22 0631) Weight:  [100.401 kg (221 lb 5.5 oz)] 100.401 kg (221 lb 5.5 oz) (07/21 2105) Weight change: 0 kg (0 lb) Last BM Date: 10/28/12  Intake/Output from previous day: 07/21 0701 - 07/22 0700 In: 600 [P.O.:600] Out: -  Intake/Output this shift: Total I/O In: 240 [P.O.:240] Out: -   General appearance: alert and cooperative Resp: clear to auscultation bilaterally Cardio: irregularly irregular rhythm GI: soft, non-tender; bowel sounds normal; no masses,  no organomegaly Male genitalia: normal, balanitis improved Extremities: extremities normal, atraumatic, no cyanosis or edema  Lab Results:  Recent Labs  10/29/12 0430 10/30/12 0500  WBC 4.7 5.1  HGB 7.5* 7.6*  HCT 23.0* 23.4*  PLT 224 244   BMET  Recent Labs  10/29/12 0430 10/30/12 0500  NA 138 136  K 4.0 4.4  CL 110 108  CO2 19 19  GLUCOSE 137* 151*  BUN 42* 37*  CREATININE 2.32* 2.16*  CALCIUM 9.1 8.8    Studies/Results: No results found.  Medications: I have reviewed the patient's current medications.  Assessment/Plan: Hypotension/ confusion/ UTI- continue ABX IV; culture is no growth but patient was on cipro A/C renal Failure- ACE and Lasix on hold- renal function back to baseline. ?Seizure activity prior to admission, check CT head Anemia ACD- Start on Iron- recheck CBC;gave dose of aranesp, appreciate pharm help DM- hold glucotrol- SS insulin, control IK  H/o CHF- BNP elevated- clinically not in failure- lasix on hold.  REstart ACEI Afib Rate controlled  Balanitis- much better on ketoconazole cream  PT consult       LOS: 2 days   Haroun Cotham JOSEPH 10/30/2012, 6:35 AM

## 2012-10-31 LAB — BASIC METABOLIC PANEL
CO2: 19 mEq/L (ref 19–32)
Glucose, Bld: 132 mg/dL — ABNORMAL HIGH (ref 70–99)
Potassium: 4.4 mEq/L (ref 3.5–5.1)
Sodium: 134 mEq/L — ABNORMAL LOW (ref 135–145)

## 2012-10-31 LAB — GLUCOSE, CAPILLARY: Glucose-Capillary: 162 mg/dL — ABNORMAL HIGH (ref 70–99)

## 2012-10-31 MED ORDER — CIPROFLOXACIN HCL 500 MG PO TABS
500.0000 mg | ORAL_TABLET | Freq: Every day | ORAL | Status: DC
  Administered 2012-10-31 – 2012-11-02 (×3): 500 mg via ORAL
  Filled 2012-10-31 (×4): qty 1

## 2012-10-31 NOTE — Progress Notes (Signed)
Subjective: No complaints  Objective: Vital signs in last 24 hours: Temp:  [97.5 F (36.4 C)-100.8 F (38.2 C)] 98.6 F (37 C) (07/23 0423) Pulse Rate:  [75-95] 95 (07/23 0423) Resp:  [17-18] 17 (07/23 0423) BP: (129-185)/(53-74) 185/68 mmHg (07/23 0423) SpO2:  [97 %-100 %] 99 % (07/23 0423) Weight:  [100.401 kg (221 lb 5.5 oz)] 100.401 kg (221 lb 5.5 oz) (07/22 2040) Weight change: 0 kg (0 lb) Last BM Date: 10/28/12  Intake/Output from previous day: 07/22 0701 - 07/23 0700 In: 600 [P.O.:600] Out: -  Intake/Output this shift:    General appearance: alert and cooperative Resp: clear to auscultation bilaterally Cardio: regular rate and rhythm, S1, S2 normal, no murmur, click, rub or gallop GI: soft, non-tender; bowel sounds normal; no masses,  no organomegaly Extremities: extremities normal, atraumatic, no cyanosis or edema  Lab Results:  Recent Labs  10/29/12 0430 10/30/12 0500  WBC 4.7 5.1  HGB 7.5* 7.6*  HCT 23.0* 23.4*  PLT 224 244   BMET  Recent Labs  10/29/12 0430 10/30/12 0500  NA 138 136  K 4.0 4.4  CL 110 108  CO2 19 19  GLUCOSE 137* 151*  BUN 42* 37*  CREATININE 2.32* 2.16*  CALCIUM 9.1 8.8    Studies/Results: Ct Head Wo Contrast  10/30/2012   *RADIOLOGY REPORT*  Clinical Data: Seizure activity prior to admission.  Hypertension. Confusion.  Anemia.  CHF.  Atrial fibrillation.  CT HEAD WITHOUT CONTRAST  Technique:  Contiguous axial images were obtained from the base of the skull through the vertex without contrast.  Comparison: MRI 01/14/2011.  CT 01/14/2011.  Findings: Bone windows demonstrate clear paranasal sinuses and mastoid air cells.  Soft tissue windows demonstrate right cerebral atrophy.  Moderate low density in the periventricular white matter likely related to small vessel disease.Encephalomalacia within the right frontal parietal and medial left occipital lobes, consistent with remote infarct.  Resultant ex vacuo dilatation of the  occipital horn left lateral ventricle.  Remote left sided basal ganglia lacunar infarcts.  No  mass lesion, hemorrhage, hydrocephalus, acute infarct, intra- axial, or extra-axial fluid collection.  IMPRESSION:  1.  Advanced chronic changes, include prior infarcts, atrophy, and small vessel ischemic change. 2.  No evidence of acute superimposed process.   Original Report Authenticated By: Jeronimo Greaves, M.D.    Medications: I have reviewed the patient's current medications.  Assessment/Plan: Hypotension/ confusion/ UTI-change to po antibiotics; culture is no growth but patient was on cipro  A/C renal Failure- ACE and Lasix on hold- renal function back to baseline.  ?Seizure activity prior to admission,  CT head nonacute Anemia ACD-  on Iron- gave dose of aranesp, appreciate pharm help  DM- hold glucotrol- SS insulin,  H/o CHF- BNP elevated- clinically not in failure- lasix on hold. REstart ACEI  Afib Rate controlled  Balanitis- much better on ketoconazole cream  PT consult noted [facility] to discharge next 24-48 hours    LOS: 3 days   [name] 10/31/2012, 7:28 AM

## 2012-10-31 NOTE — Care Management Note (Signed)
Spoke with Dr Valentina Lucks re [facility] needs for this pt. [facility] will continue to provide [facility] and HHPT, they will also administer Aranesp SQ for the 3 more doses. Will need prescription and [facility] will obtain medication from [facility].  MD please provide prescription as soon as possible.  Johny Shock RN MPH Case Manager [phone]

## 2012-11-01 LAB — GLUCOSE, CAPILLARY
Glucose-Capillary: 123 mg/dL — ABNORMAL HIGH (ref 70–99)
Glucose-Capillary: 147 mg/dL — ABNORMAL HIGH (ref 70–99)

## 2012-11-01 MED ORDER — DARBEPOETIN ALFA-POLYSORBATE 40 MCG/0.4ML IJ SOLN: 40.0000 ug | INTRAMUSCULAR | Status: DC

## 2012-11-01 MED ORDER — GLIPIZIDE ER 5 MG PO TB24
5.0000 mg | ORAL_TABLET | Freq: Every day | ORAL | Status: DC
  Administered 2012-11-01 – 2012-11-02 (×2): 5 mg via ORAL
  Filled 2012-11-01 (×3): qty 1

## 2012-11-01 NOTE — Progress Notes (Signed)
Subjective: No complaints  Objective: Vital signs in last 24 hours: Temp:  [98 F (36.7 C)-98.6 F (37 C)] 98.6 F (37 C) (07/24 0525) Pulse Rate:  [79-90] 90 (07/24 0525) Resp:  [18] 18 (07/24 0525) BP: (108-164)/(51-66) 108/51 mmHg (07/24 0525) SpO2:  [96 %-100 %] 100 % (07/24 0525) Weight:  [100.401 kg (221 lb 5.5 oz)] 100.401 kg (221 lb 5.5 oz) (07/23 2107) Weight change: 0 kg (0 lb) Last BM Date: 10/28/12  Intake/Output from previous day: 07/23 0701 - 07/24 0700 In: 820 [P.O.:820] Out: -  Intake/Output this shift:    General appearance: alert and cooperative Resp: clear to auscultation bilaterally Cardio: regular rate and rhythm, S1, S2 normal, no murmur, click, rub or gallop Extremities: extremities normal, atraumatic, no cyanosis or edema  Lab Results:  Recent Labs  10/30/12 0500  WBC 5.1  HGB 7.6*  HCT 23.4*  PLT 244   BMET  Recent Labs  10/30/12 0500 10/31/12 0640  NA 136 134*  K 4.4 4.4  CL 108 105  CO2 19 19  GLUCOSE 151* 132*  BUN 37* 35*  CREATININE 2.16* 1.81*  CALCIUM 8.8 9.2    Studies/Results: Ct Head Wo Contrast  10/30/2012   *RADIOLOGY REPORT*  Clinical Data: Seizure activity prior to admission.  Hypertension. Confusion.  Anemia.  CHF.  Atrial fibrillation.  CT HEAD WITHOUT CONTRAST  Technique:  Contiguous axial images were obtained from the base of the skull through the vertex without contrast.  Comparison: MRI 01/14/2011.  CT 01/14/2011.  Findings: Bone windows demonstrate clear paranasal sinuses and mastoid air cells.  Soft tissue windows demonstrate right cerebral atrophy.  Moderate low density in the periventricular white matter likely related to small vessel disease.Encephalomalacia within the right frontal parietal and medial left occipital lobes, consistent with remote infarct.  Resultant ex vacuo dilatation of the occipital horn left lateral ventricle.  Remote left sided basal ganglia lacunar infarcts.  No  mass lesion, hemorrhage,  hydrocephalus, acute infarct, intra- axial, or extra-axial fluid collection.  IMPRESSION:  1.  Advanced chronic changes, include prior infarcts, atrophy, and small vessel ischemic change. 2.  No evidence of acute superimposed process.   Original Report Authenticated By: Jeronimo Greaves, M.D.    Medications: I have reviewed the patient's current medications.  Assessment/Plan: UTI-changed to po antibiotics; culture is no growth but patient was on cipro  A/C renal Failure- ACE restarted, Lasix on hold- renal function back to baseline.  ?Seizure activity prior to admission, CT head nonacute  Anemia ACD- on Iron- gave dose of aranesp, appreciate pharm help  DM- restart glucotrol, D/C SSI H/o CHF- BNP elevated- clinically not in failure- lasix on hold. REstarted ACEI  Afib Rate controlled  Balanitis- much better on ketoconazole cream  PT consult noted  Discharge planned for tomorrow    LOS: 4 days   [name] JOSEPH 11/01/2012, 7:18 AM

## 2012-11-01 NOTE — Progress Notes (Signed)
Physical Therapy Treatment Patient Details Name: [name] MRN: [id number] DOB: November 12, 1917 Today's Date: 11/01/2012 Time: 4098-1191 PT Time Calculation (min): 18 min  PT Assessment / Plan / Recommendation  History of Present Illness 79 y.o. male with PMH of DM, HTN, A fib, PVD, and chronic anemia presented with episode of hypotension and syncope.      PT Comments   Pt able to tolerate hallway ambulation with assist today and required rest break.  Continue to recommend ST-SNF upon d/c as pt lives alone and currently needs assist for mobility and safety.   Follow Up Recommendations  SNF;Supervision/Assistance - 24 hour     Does the patient have the potential to tolerate intense rehabilitation     Barriers to Discharge        Equipment Recommendations  None recommended by PT    Recommendations for Other Services    Frequency     Progress towards PT Goals Progress towards PT goals: Progressing toward goals  Plan Current plan remains appropriate    Precautions / Restrictions Precautions Precautions: Fall   Pertinent Vitals/Pain n/a   Mobility  Bed Mobility Bed Mobility: Supine to Sit;Sit to Supine Supine to Sit: 3: Mod assist Sit to Supine: 4: Min guard Details for Bed Mobility Assistance: mod assist for trunk upright with bed pad Transfers Transfers: Sit to Stand;Stand to Sit Sit to Stand: 4: Min assist;From bed;With upper extremity assist Stand to Sit: 4: Min assist;To bed;With upper extremity assist Details for Transfer Assistance: verbal cues for safe technique Ambulation/Gait Ambulation/Gait Assistance: 4: Min assist Ambulation Distance (Feet): 120 Feet Assistive device: Rolling walker Ambulation/Gait Assistance Details: verbal cues for RW distance and posture, pt required seated rest break on return to room due to fatigue, verbal cue to keep RW on floor with turning Gait Pattern: Step-through pattern;Decreased stride length;Trunk flexed Gait velocity:  decreased    Exercises     PT Diagnosis:    PT Problem List:   PT Treatment Interventions:     PT Goals (current goals can now be found in the care plan section)    Visit Information  Last PT Received On: 11/01/12 Assistance Needed: +1 History of Present Illness: 79 y.o. male with PMH of DM, HTN, A fib, PVD, and chronic anemia presented with episode of hypotension and syncope.    Subjective Data      Cognition  Cognition Arousal/Alertness: Awake/alert Behavior During Therapy: WFL for tasks assessed/performed Overall Cognitive Status: Within Functional Limits for tasks assessed    Balance     End of Session PT - End of Session Equipment Utilized During Treatment: Gait belt Activity Tolerance: Patient limited by fatigue Patient left: in bed;with call bell/phone within reach;with bed alarm set   GP     Aubrionna Istre,KATHrine E 11/01/2012, 3:41 PM Zenovia Jarred, PT, DPT 11/01/2012 Pager: [phone]

## 2012-11-01 NOTE — Progress Notes (Signed)
MEDICATION RELATED CONSULT NOTE - FOLLOW UP  Pharmacy Consult for Aranesp Indication: Anemia of Chronic Disease  Allergies  Allergen Reactions  . Penicillins Swelling  . Sulfa Antibiotics Other (See Comments)    unknown    Patient Measurements: Height: 5\' 9"  (175.3 cm) Weight: 221 lb 5.5 oz (100.401 kg) IBW/kg (Calculated) : 70.7   Vital Signs: Temp: 98.6 F (37 C) (07/24 0525) Temp src: Oral (07/24 0525) BP: 108/51 mmHg (07/24 0525) Pulse Rate: 90 (07/24 0525) Intake/Output from previous day: 07/23 0701 - 07/24 0700 In: 820 [P.O.:820] Out: -   Labs:  Recent Labs  10/30/12 0500 10/31/12 0640  WBC 5.1  --   HGB 7.6*  --   HCT 23.4*  --   PLT 244  --   CREATININE 2.16* 1.81*   Estimated Creatinine Clearance: 29.8 ml/min (by C-G formula based on Cr of 1.81).   Assessment: 76 YOM admitted for hypotension/confusion/UTI who is receiving treatment with PO ciprofloxacin. Additionally, patient has anemia of chronic disease and pharmacy was consulted to dose Aranesp. Iron panel from 7/21 was all WNL. Hgb 7.6 on 7/22, platelet count remains WNL. Patient does have elevated B-12 count. Retic count 7/21 48.6.  Note, the dose of Aranesp was not given 7/22 PM as scheduled, but rather 7/23 AM. Will be ok to give second dose on Tuesday 7/29.  Goal of Therapy:  Increase of Hgb by no more than 1g/dL in a 2 week period  Plan:  1. Continue Aranesp subq QTuesday at 1800 x4 doses 2. Weekly reticulocyte counts 3. At least weekly CBCs to monitor Hgb trend  Samanta Gal D. Bram Hottel, PharmD Clinical Pharmacist Pager: [phone] 11/01/2012 11:11 AM

## 2012-11-02 LAB — CBC
Hemoglobin: 7.4 g/dL — ABNORMAL LOW (ref 13.0–17.0)
RBC: 2.32 MIL/uL — ABNORMAL LOW (ref 4.22–5.81)
WBC: 5 10*3/uL (ref 4.0–10.5)

## 2012-11-02 LAB — GLUCOSE, CAPILLARY: Glucose-Capillary: 142 mg/dL — ABNORMAL HIGH (ref 70–99)

## 2012-11-02 LAB — BASIC METABOLIC PANEL
GFR calc Af Amer: 29 mL/min — ABNORMAL LOW (ref >90)
GFR calc non Af Amer: 25 mL/min — ABNORMAL LOW (ref >90)
Potassium: 4.3 mEq/L (ref 3.5–5.1)
Sodium: 136 mEq/L (ref 135–145)

## 2012-11-02 MED ORDER — LISINOPRIL 10 MG PO TABS: 10.0000 mg | ORAL_TABLET | Freq: Every day | ORAL | Status: DC

## 2012-11-02 MED ORDER — POLYSACCHARIDE IRON COMPLEX 150 MG PO CAPS: 150.0000 mg | ORAL_CAPSULE | Freq: Every day | ORAL | Status: DC

## 2012-11-02 MED ORDER — DARBEPOETIN ALFA-POLYSORBATE 40 MCG/0.4ML IJ SOLN: 40.0000 ug | INTRAMUSCULAR | Status: DC

## 2012-11-02 MED ORDER — GLIPIZIDE ER 5 MG PO TB24: 5.0000 mg | ORAL_TABLET | Freq: Every day | ORAL | Status: DC

## 2012-11-02 NOTE — Clinical Social Work Note (Signed)
Patient medically stable and discharging home today. Family requested ambulance transport. CSW facilitated transport home via ambulance.  Genelle Bal, MSW, LCSW [phone]

## 2012-11-04 ENCOUNTER — Encounter: Payer: Self-pay | Admitting: Adult Health

## 2012-11-04 DIAGNOSIS — R400 Somnolence: Secondary | ICD-10-CM | POA: Insufficient documentation

## 2012-11-04 NOTE — Progress Notes (Signed)
  Subjective:    Patient ID: [name], male    DOB: 21-Sep-1917, 79 y.o.   MRN: [id number]  HPI This is a 79 year old male who had a fall incident 4 days ago. Urine culture obtained and result shows >= 100,000 colonies/ml Proteus Mirabilis. No hematuria nor fever reported.   Review of Systems  Constitutional: Negative for fever.  Respiratory: Negative for cough and shortness of breath.   Cardiovascular: Positive for leg swelling. Negative for chest pain.  Endocrine: Negative.   Genitourinary: Negative for hematuria.  Neurological: Negative.   Psychiatric/Behavioral: Negative.        Objective:   Physical Exam  Nursing note and vitals reviewed. Constitutional: He is oriented to person, place, and time. He appears well-developed and well-nourished.  Cardiovascular: Normal rate, regular rhythm and normal heart sounds.   Pulmonary/Chest: Effort normal and breath sounds normal.  Abdominal: Soft. Bowel sounds are normal.  Musculoskeletal: He exhibits edema. He exhibits no tenderness.  BLE edema, 2+  Neurological: He is alert and oriented to person, place, and time.  Skin: Skin is warm and dry.  Psychiatric: He has a normal mood and affect. His behavior is normal. Judgment and thought content normal.    LABS/PROCEDURES: 09/25/12  hgbA1c 6.1  bnp 109.7 09/24/12  NA 135  K 4.5  Glucose 129  BUN 89  Creatinine 2.73   09/19/12  Wbc 7.0  hgb 8.1  hct 24.5     Medications reviewed.     Assessment & Plan:   UTI - start Bactrim DS 1 tab by mouth twice a day x7 days

## 2012-11-04 NOTE — Progress Notes (Signed)
  Subjective:    Patient ID: [name], male    DOB: 19-Sep-1917, 79 y.o.   MRN: [id number]  HPI This is a 79 year old male who is for discharge home with home health PT, OT, nursing and home health aide. DME: Hospital bed, standard wheelchair and with elevating leg rests and wheelchair cushion. He suffers from congestive heart failure or and has trouble breathing at night when head is the date that less than 30. Bed legs just do not provide enough elevation to resolve. The issues. Shortness of breath causes patient to require frequent changes in body position which cannot be achieved with a normal. Wheelchair is needed to assist with ADL cares and for safety. He has been admitted to [facility] place on 09/24/12 from [facility] with principal discharge diagnosis of acute diastolic CHF. He has completed SNF rehabilitation and therapy has cleared the patient for discharge.   Review of Systems  Constitutional: Negative.   HENT: Negative.   Eyes: Negative.   Respiratory: Negative for cough, shortness of breath and wheezing.   Cardiovascular: Positive for leg swelling. Negative for chest pain.  Gastrointestinal: Negative.   Endocrine: Negative.   Genitourinary: Negative.   Neurological: Negative.   Psychiatric/Behavioral: Negative.        Objective:   Physical Exam  Nursing note and vitals reviewed. Constitutional: He is oriented to person, place, and time. He appears well-developed and well-nourished.  HENT:  Head: Normocephalic and atraumatic.  Right Ear: External ear normal.  Left Ear: External ear normal.  Nose: Nose normal.  Mouth/Throat: Oropharynx is clear and moist.  Eyes: Conjunctivae are normal. Pupils are equal, round, and reactive to light.  Neck: Normal range of motion. Neck supple.  Cardiovascular: Normal rate, regular rhythm, normal heart sounds and intact distal pulses.   Pulmonary/Chest: Effort normal and breath sounds normal.  Abdominal: Soft. Bowel sounds  are normal.  Musculoskeletal: He exhibits edema. He exhibits no tenderness.  BLE edema, 2+  Neurological: He is alert and oriented to person, place, and time.  Skin: Skin is warm and dry.  Psychiatric: He has a normal mood and affect. His behavior is normal. Judgment and thought content normal.      LABS/PROCEDURES: 10/18/12 WBC 6.3 hemoglobin 8.8 hematocrit 26.9 10/13/12 sodium 139 potassium 4.6 glucose 143 BUN 36 creatinine 1.96 calcium 8.7 10/11/12 WBC 6.3 hemoglobin 8.2 hematocrit 24.5 09/25/12  hgbA1c 6.1  bnp 109.7 09/24/12  NA 135  K 4.5  Glucose 129  BUN 89  Creatinine 2.73   09/19/12  Wbc 7.0  hgb 8.1  hct 24.5     Medications reviewed.       Assessment & Plan:   Diastolic Heart Failure - stable  Atrial fibrillation - rate-controlled  T2DM (type 2 diabetes mellitus) - stable; diet-controlled  Hypertension -  Well-controlled  Anemia - stable  UTI - continue Bactrim for a total of 7 days     Total discharge time: >30 minutes Discharge time involved coordination of the discharge process with social worker, nursing stopped and therapy department. Medical justification for home health services/DME verified.    CPT CODE:  82956

## 2012-11-04 NOTE — Discharge Summary (Signed)
Physician Discharge Summary  Patient ID: [name] MRN: [id number] DOB/AGE: [birth year] 77 y.o.  Admit date: 10/28/2012 Discharge date: 11/04/2012  Admission Diagnoses: Urinary tract infection Anemia of kidney disease Heart failure, diastolic and systolic dysfunction Atrial fibrillation Type 2 diabetes mellitus Hypertension Balanitis   Discharge Diagnoses:  Principal Problem:   UTI (lower urinary tract infection) Active Problems: Anemia of kidney disease   Heart failure,systolic and diastolic   Atrial fibrillation   T2DM (type 2 diabetes mellitus)   Hypertension Balanitis   Discharged Condition: good  Hospital Course: 79 year old white male recently discharged home from nursing home on Cipro for UTI who had developed chills at home, confusion and therefore brought to emergency room. The EMS found him to be hypotensive, although blood pressure in ER when first recorded was 130/50.. The patient was found to be in acute on chronic renal failure at admission with BUN 47 creatinine 3.63. He was admitted and placed on IV aztreonam and given some IV fluids. His blood pressure quickly stabilized. ACE inhibitor and diuretics were held. The patient's urine culture proved to be negative. Eventually he was switched to Cipro and had no further issue of fever or chills or hemodynamic instability. His renal function improved with IV fluids and at discharge was at baseline with BUN 51 creatinine 2.15. The patient's ACE inhibitor was restarted but he is diuretics as he showed no evidence of congestive heart failure during this admission. He was given a dose of Aranesp for hemoglobin of approximately 7.5. Also started on iron. She will receive Aranesp weekly x3 as an outpatient with weekly CBCs. His diabetes remained under reasonable control treated with sliding scale insulins and restarted on Glucotrol a lower dose. He was concerned of a possible seizure prior to admission and a CAT scan of the  brain was done and showed no acute abnormality. His atrial fibrillation rate remained controlled on no medication, he is not considered a Coumadin candidate. History it with keteconazole  cream for balanitis. He was seen by physical therapy and we opted to discharge him home with home health.  Consults: None  Significant Diagnostic Studies: labs: At discharge, sodium 136 potassium 4.3 chloride 107 glucose 123 BUN 51 creatinine 2.5, WBC 5.3 hemoglobin 7.4  platelet count 254 and radiology: CXR: Chronic lung disease and underlying interstitial edema and CT scan: As above  Treatments: IV hydration, antibiotics: Aztreonam and therapies: PT  Discharge Exam: Blood pressure 110/47, pulse 86, temperature 97.8 F (36.6 C), temperature source Oral, resp. rate 20, height 5\' 9"  (1.753 m), weight 97 kg (213 lb 13.5 oz), SpO2 99.00%. Back: negative, symmetric, no curvature. ROM normal. No CVA tenderness. Cardio: regular rate and rhythm, S1, S2 normal, no murmur, click, rub or gallop  Disposition: 01-Home or Self Care   Future Appointments Provider Department Dept Phone   06/07/2013 11:00 AM [facility] [phone]   06/07/2013 12:00 PM Evern Bio, NP [facility] [phone]       Medication List    STOP taking these medications       amLODipine 10 MG tablet  Commonly known as:  NORVASC     ciprofloxacin 250 MG tablet  Commonly known as:  CIPRO     furosemide 40 MG tablet  Commonly known as:  LASIX     glipiZIDE 10 MG tablet  Commonly known as:  GLUCOTROL  Replaced by:  glipiZIDE 5 MG 24 hr tablet     niacin  500 MG CR tablet  Commonly known as:  NIASPAN      TAKE these medications       aspirin EC 81 MG tablet  Take 81 mg by mouth every morning.     beta carotene w/minerals tablet  Take 1 tablet by mouth every morning.     cilostazol 100 MG tablet  Commonly known as:  PLETAL  Take 100 mg by mouth 2  (two) times daily.     darbepoetin 40 MCG/0.4ML Soln  Commonly known as:  ARANESP  Inject 0.4 mLs (40 mcg total) into the skin every Tuesday at 6 PM.     glipiZIDE 5 MG 24 hr tablet  Commonly known as:  GLUCOTROL XL  Take 1 tablet (5 mg total) by mouth daily with breakfast.     iron polysaccharides 150 MG capsule  Commonly known as:  NIFEREX  Take 1 capsule (150 mg total) by mouth daily.     isosorbide mononitrate 30 MG 24 hr tablet  Commonly known as:  IMDUR  Take 30 mg by mouth daily.     lisinopril 10 MG tablet  Commonly known as:  PRINIVIL,ZESTRIL  Take 1 tablet (10 mg total) by mouth daily.     metoprolol succinate 25 MG 24 hr tablet  Commonly known as:  TOPROL-XL  Take 25 mg by mouth daily after breakfast.     nitroGLYCERIN 0.4 MG SL tablet  Commonly known as:  NITROSTAT  Place 0.4 mg under the tongue every 5 (five) minutes as needed for chest pain.     tamsulosin 0.4 MG Caps  Commonly known as:  FLOMAX  Take 0.4 mg by mouth daily after breakfast.           Follow-up Information   Follow up with Lillia Mountain, MD In 1 week.   Contact information:   [address], [address] Jaynie Crumble [place] [address] [phone]       Signed: Lillia Mountain 11/04/2012, 11:48 AM

## 2012-11-04 NOTE — Progress Notes (Signed)
  Subjective:    Patient ID: [name], male    DOB: Oct 23, 1917, 79 y.o.   MRN: [id number]  HPI  This is a 79 year old male who has been noted to be sleepy during the day. Review of medications was requested by daughter. It was noted that patient has been getting Restoril at night.  Review of Systems  Constitutional: Negative.   Eyes: Negative.   Respiratory: Negative for cough, shortness of breath and wheezing.   Cardiovascular: Negative for chest pain, palpitations and leg swelling.  Endocrine: Negative.   Genitourinary: Negative.   Neurological: Negative.   Hematological: Negative for adenopathy. Does not bruise/bleed easily.  Psychiatric/Behavioral: Positive for sleep disturbance.       Objective:   Physical Exam  Nursing note and vitals reviewed. Constitutional: He is oriented to person, place, and time. He appears well-developed and well-nourished.  HENT:  Head: Normocephalic and atraumatic.  Right Ear: External ear normal.  Left Ear: External ear normal.  Nose: Nose normal.  Mouth/Throat: Oropharynx is clear and moist.  Eyes: Conjunctivae and EOM are normal. Pupils are equal, round, and reactive to light.  Neck: Normal range of motion. Neck supple. No thyromegaly present.  Cardiovascular: Normal rate.   Irregular heart rate  Pulmonary/Chest: Effort normal and breath sounds normal. No respiratory distress.  Abdominal: Soft. Bowel sounds are normal. He exhibits no distension.  Musculoskeletal: Normal range of motion. He exhibits edema. He exhibits no tenderness.  BLE edema, 2+  Neurological: He is alert and oriented to person, place, and time.  Skin: Skin is warm and dry.  Psychiatric: He has a normal mood and affect. His behavior is normal. Judgment and thought content normal.    LABS: 09/25/12  hgbA1c 6.1  bnp 109.7 09/24/12  NA 135  K 4.5  Glucose 129  BUN 89  Creatinine 2.73   09/19/12  Wbc 7.0  hgb 8.1  hct 24.5   Medications reviewed per [facility]      Assessment & Plan:   Somnolence - discontinue Restoril

## 2012-11-30 ENCOUNTER — Inpatient Hospital Stay (HOSPITAL_COMMUNITY)
Admission: EM | Admit: 2012-11-30 | Discharge: 2012-12-07 | DRG: 291 | Disposition: A | Discharge status: Home Health | Payer: Medicare HMO | Attending: Internal Medicine | Admitting: Internal Medicine

## 2012-11-30 ENCOUNTER — Emergency Department (HOSPITAL_COMMUNITY): Payer: Medicare HMO

## 2012-11-30 ENCOUNTER — Encounter (HOSPITAL_COMMUNITY): Payer: Self-pay | Admitting: Emergency Medicine

## 2012-11-30 DIAGNOSIS — E1129 Type 2 diabetes mellitus with other diabetic kidney complication: Secondary | ICD-10-CM | POA: Diagnosis present

## 2012-11-30 DIAGNOSIS — I5020 Unspecified systolic (congestive) heart failure: Principal | ICD-10-CM | POA: Diagnosis present

## 2012-11-30 DIAGNOSIS — J45909 Unspecified asthma, uncomplicated: Secondary | ICD-10-CM | POA: Diagnosis present

## 2012-11-30 DIAGNOSIS — E876 Hypokalemia: Secondary | ICD-10-CM | POA: Diagnosis present

## 2012-11-30 DIAGNOSIS — I129 Hypertensive chronic kidney disease with stage 1 through stage 4 chronic kidney disease, or unspecified chronic kidney disease: Secondary | ICD-10-CM | POA: Diagnosis present

## 2012-11-30 DIAGNOSIS — Z7982 Long term (current) use of aspirin: Secondary | ICD-10-CM

## 2012-11-30 DIAGNOSIS — N179 Acute kidney failure, unspecified: Secondary | ICD-10-CM | POA: Diagnosis present

## 2012-11-30 DIAGNOSIS — H3530 Unspecified macular degeneration: Secondary | ICD-10-CM | POA: Diagnosis present

## 2012-11-30 DIAGNOSIS — R300 Dysuria: Secondary | ICD-10-CM | POA: Diagnosis present

## 2012-11-30 DIAGNOSIS — D649 Anemia, unspecified: Secondary | ICD-10-CM | POA: Diagnosis present

## 2012-11-30 DIAGNOSIS — R369 Urethral discharge, unspecified: Secondary | ICD-10-CM | POA: Diagnosis present

## 2012-11-30 DIAGNOSIS — Z9861 Coronary angioplasty status: Secondary | ICD-10-CM

## 2012-11-30 DIAGNOSIS — I4891 Unspecified atrial fibrillation: Secondary | ICD-10-CM | POA: Diagnosis present

## 2012-11-30 DIAGNOSIS — Z8551 Personal history of malignant neoplasm of bladder: Secondary | ICD-10-CM

## 2012-11-30 DIAGNOSIS — N183 Chronic kidney disease, stage 3 unspecified: Secondary | ICD-10-CM | POA: Diagnosis present

## 2012-11-30 DIAGNOSIS — C9200 Acute myeloblastic leukemia, not having achieved remission: Secondary | ICD-10-CM | POA: Diagnosis present

## 2012-11-30 DIAGNOSIS — D631 Anemia in chronic kidney disease: Secondary | ICD-10-CM | POA: Diagnosis present

## 2012-11-30 DIAGNOSIS — I509 Heart failure, unspecified: Secondary | ICD-10-CM | POA: Diagnosis present

## 2012-11-30 DIAGNOSIS — Z87891 Personal history of nicotine dependence: Secondary | ICD-10-CM

## 2012-11-30 DIAGNOSIS — J189 Pneumonia, unspecified organism: Secondary | ICD-10-CM

## 2012-11-30 DIAGNOSIS — Z79899 Other long term (current) drug therapy: Secondary | ICD-10-CM

## 2012-11-30 DIAGNOSIS — R918 Other nonspecific abnormal finding of lung field: Secondary | ICD-10-CM | POA: Diagnosis present

## 2012-11-30 HISTORY — DX: Pneumonia, unspecified organism: J18.9

## 2012-11-30 LAB — URINE MICROSCOPIC-ADD ON

## 2012-11-30 LAB — HEMOGLOBIN A1C
Hgb A1c MFr Bld: 6.6 % — ABNORMAL HIGH (ref <5.7)
Mean Plasma Glucose: 143 mg/dL — ABNORMAL HIGH (ref <117)

## 2012-11-30 LAB — BASIC METABOLIC PANEL
BUN: 13 mg/dL (ref 6–23)
BUN: 16 mg/dL (ref 6–23)
CO2: 25 mEq/L (ref 19–32)
CO2: 22 mEq/L (ref 19–32)
Calcium: 8.2 mg/dL — ABNORMAL LOW (ref 8.4–10.5)
Calcium: 8.2 mg/dL — ABNORMAL LOW (ref 8.4–10.5)
Chloride: 103 mEq/L (ref 96–112)
Chloride: 104 mEq/L (ref 96–112)
Creatinine, Ser: 1.52 mg/dL — ABNORMAL HIGH (ref 0.50–1.35)
Creatinine, Ser: 1.61 mg/dL — ABNORMAL HIGH (ref 0.50–1.35)
GFR calc Af Amer: 44 mL/min — ABNORMAL LOW (ref >90)
GFR calc Af Amer: 41 mL/min — ABNORMAL LOW (ref >90)
GFR calc non Af Amer: 38 mL/min — ABNORMAL LOW (ref >90)
GFR calc non Af Amer: 35 mL/min — ABNORMAL LOW (ref >90)
Glucose, Bld: 258 mg/dL — ABNORMAL HIGH (ref 70–99)
Glucose, Bld: 295 mg/dL — ABNORMAL HIGH (ref 70–99)
Potassium: 2.3 mEq/L — CL (ref 3.5–5.1)
Potassium: 3 mEq/L — ABNORMAL LOW (ref 3.5–5.1)
Sodium: 140 mEq/L (ref 135–145)
Sodium: 140 mEq/L (ref 135–145)

## 2012-11-30 LAB — CBC WITH DIFFERENTIAL/PLATELET
Basophils Absolute: 0 10*3/uL (ref 0.0–0.1)
Basophils Relative: 0 % (ref 0–1)
Eosinophils Relative: 1 % (ref 0–5)
HCT: 22.6 % — ABNORMAL LOW (ref 39.0–52.0)
MCHC: 34.1 g/dL (ref 30.0–36.0)
MCV: 94.2 fL (ref 78.0–100.0)
Monocytes Absolute: 0.1 10*3/uL (ref 0.1–1.0)
Neutro Abs: 4 10*3/uL (ref 1.7–7.7)
Platelets: 285 10*3/uL (ref 150–400)
RDW: 19.5 % — ABNORMAL HIGH (ref 11.5–15.5)

## 2012-11-30 LAB — GLUCOSE, CAPILLARY: Glucose-Capillary: 288 mg/dL — ABNORMAL HIGH (ref 70–99)

## 2012-11-30 LAB — URINALYSIS, ROUTINE W REFLEX MICROSCOPIC
Bilirubin Urine: NEGATIVE
Glucose, UA: NEGATIVE mg/dL
Ketones, ur: 15 mg/dL — AB
Specific Gravity, Urine: 1.021 (ref 1.005–1.030)
pH: 5 (ref 5.0–8.0)

## 2012-11-30 LAB — TROPONIN I: Troponin I: <0.3 ng/mL (ref <0.30)

## 2012-11-30 LAB — PRO B NATRIURETIC PEPTIDE: Pro B Natriuretic peptide (BNP): 3290 pg/mL — ABNORMAL HIGH (ref 0–450)

## 2012-11-30 MED ORDER — METOPROLOL SUCCINATE ER 25 MG PO TB24
25.0000 mg | ORAL_TABLET | Freq: Every day | ORAL | Status: DC
  Administered 2012-12-01 – 2012-12-07 (×7): 25 mg via ORAL
  Filled 2012-11-30 (×10): qty 1

## 2012-11-30 MED ORDER — NITROGLYCERIN 0.4 MG SL SUBL: 0.4000 mg | SUBLINGUAL_TABLET | SUBLINGUAL | Status: DC | PRN

## 2012-11-30 MED ORDER — INSULIN ASPART 100 UNIT/ML ~~LOC~~ SOLN
0.0000 [IU] | Freq: Three times a day (TID) | SUBCUTANEOUS | Status: DC
  Administered 2012-12-01 – 2012-12-02 (×3): 1 [IU] via SUBCUTANEOUS
  Administered 2012-12-02: 2 [IU] via SUBCUTANEOUS
  Administered 2012-12-03: 1 [IU] via SUBCUTANEOUS
  Administered 2012-12-03: 2 [IU] via SUBCUTANEOUS
  Administered 2012-12-03: 1 [IU] via SUBCUTANEOUS
  Administered 2012-12-04: 3 [IU] via SUBCUTANEOUS
  Administered 2012-12-04: 2 [IU] via SUBCUTANEOUS
  Administered 2012-12-04: 1 [IU] via SUBCUTANEOUS

## 2012-11-30 MED ORDER — HEPARIN SODIUM (PORCINE) 5000 UNIT/ML IJ SOLN
5000.0000 [IU] | Freq: Three times a day (TID) | INTRAMUSCULAR | Status: DC
  Administered 2012-11-30 – 2012-12-07 (×20): 5000 [IU] via SUBCUTANEOUS
  Filled 2012-11-30 (×25): qty 1

## 2012-11-30 MED ORDER — ASPIRIN EC 81 MG PO TBEC
81.0000 mg | DELAYED_RELEASE_TABLET | Freq: Every morning | ORAL | Status: DC
  Administered 2012-12-01 – 2012-12-07 (×7): 81 mg via ORAL
  Filled 2012-11-30 (×7): qty 1

## 2012-11-30 MED ORDER — VANCOMYCIN HCL 10 G IV SOLR
1250.0000 mg | INTRAVENOUS | Status: DC
  Administered 2012-12-01: 1250 mg via INTRAVENOUS
  Filled 2012-11-30 (×3): qty 1250

## 2012-11-30 MED ORDER — POLYSACCHARIDE IRON COMPLEX 150 MG PO CAPS
150.0000 mg | ORAL_CAPSULE | Freq: Every day | ORAL | Status: DC
  Administered 2012-12-01 – 2012-12-07 (×7): 150 mg via ORAL
  Filled 2012-11-30 (×8): qty 1

## 2012-11-30 MED ORDER — VANCOMYCIN HCL 10 G IV SOLR
1250.0000 mg | INTRAVENOUS | Status: AC
  Administered 2012-11-30: 1250 mg via INTRAVENOUS
  Filled 2012-11-30: qty 1250

## 2012-11-30 MED ORDER — TAMSULOSIN HCL 0.4 MG PO CAPS
0.4000 mg | ORAL_CAPSULE | Freq: Every day | ORAL | Status: DC
  Administered 2012-12-01 – 2012-12-07 (×7): 0.4 mg via ORAL
  Filled 2012-11-30 (×9): qty 1

## 2012-11-30 MED ORDER — DEXTROSE 5 % IV SOLN
2.0000 g | Freq: Three times a day (TID) | INTRAVENOUS | Status: DC
  Administered 2012-11-30 – 2012-12-02 (×5): 2 g via INTRAVENOUS
  Filled 2012-11-30 (×8): qty 2

## 2012-11-30 MED ORDER — ALBUTEROL SULFATE (5 MG/ML) 0.5% IN NEBU
2.5000 mg | INHALATION_SOLUTION | Freq: Three times a day (TID) | RESPIRATORY_TRACT | Status: DC
  Administered 2012-12-01 – 2012-12-02 (×4): 2.5 mg via RESPIRATORY_TRACT
  Filled 2012-11-30 (×5): qty 0.5

## 2012-11-30 MED ORDER — INSULIN ASPART 100 UNIT/ML ~~LOC~~ SOLN
0.0000 [IU] | Freq: Every day | SUBCUTANEOUS | Status: DC
  Administered 2012-11-30: 3 [IU] via SUBCUTANEOUS
  Administered 2012-12-02 – 2012-12-04 (×2): 2 [IU] via SUBCUTANEOUS

## 2012-11-30 MED ORDER — ALBUTEROL SULFATE (5 MG/ML) 0.5% IN NEBU
2.5000 mg | INHALATION_SOLUTION | RESPIRATORY_TRACT | Status: DC | PRN
  Administered 2012-12-02 – 2012-12-03 (×2): 2.5 mg via RESPIRATORY_TRACT
  Filled 2012-11-30 (×2): qty 0.5

## 2012-11-30 MED ORDER — IPRATROPIUM BROMIDE 0.02 % IN SOLN
0.5000 mg | Freq: Three times a day (TID) | RESPIRATORY_TRACT | Status: DC
  Administered 2012-12-01 – 2012-12-02 (×4): 0.5 mg via RESPIRATORY_TRACT
  Filled 2012-11-30 (×5): qty 2.5

## 2012-11-30 MED ORDER — ALBUTEROL SULFATE (5 MG/ML) 0.5% IN NEBU
2.5000 mg | INHALATION_SOLUTION | RESPIRATORY_TRACT | Status: DC
  Administered 2012-11-30 (×2): 2.5 mg via RESPIRATORY_TRACT
  Filled 2012-11-30 (×2): qty 0.5

## 2012-11-30 MED ORDER — POTASSIUM CHLORIDE CRYS ER 20 MEQ PO TBCR
40.0000 meq | EXTENDED_RELEASE_TABLET | Freq: Once | ORAL | Status: DC
Start: 2012-11-30 — End: 2012-12-01

## 2012-11-30 MED ORDER — OCUVITE PO TABS
1.0000 | ORAL_TABLET | Freq: Every morning | ORAL | Status: DC
  Administered 2012-12-01 – 2012-12-07 (×7): 1 via ORAL
  Filled 2012-11-30 (×7): qty 1

## 2012-11-30 MED ORDER — POTASSIUM CHLORIDE 10 MEQ/100ML IV SOLN
10.0000 meq | INTRAVENOUS | Status: AC
  Administered 2012-11-30 (×3): 10 meq via INTRAVENOUS
  Filled 2012-11-30: qty 100
  Filled 2012-11-30: qty 200

## 2012-11-30 MED ORDER — ISOSORBIDE MONONITRATE ER 30 MG PO TB24
30.0000 mg | ORAL_TABLET | Freq: Every day | ORAL | Status: DC
  Administered 2012-12-01 – 2012-12-07 (×7): 30 mg via ORAL
  Filled 2012-11-30 (×8): qty 1

## 2012-11-30 MED ORDER — LISINOPRIL 10 MG PO TABS
10.0000 mg | ORAL_TABLET | Freq: Every day | ORAL | Status: DC
  Administered 2012-12-01 – 2012-12-05 (×5): 10 mg via ORAL
  Filled 2012-11-30 (×6): qty 1

## 2012-11-30 MED ORDER — IPRATROPIUM BROMIDE 0.02 % IN SOLN
0.5000 mg | RESPIRATORY_TRACT | Status: DC
  Administered 2012-11-30 (×2): 0.5 mg via RESPIRATORY_TRACT
  Filled 2012-11-30 (×2): qty 2.5

## 2012-11-30 MED ORDER — IPRATROPIUM BROMIDE 0.02 % IN SOLN
0.5000 mg | RESPIRATORY_TRACT | Status: DC | PRN
  Administered 2012-12-02 – 2012-12-03 (×2): 0.5 mg via RESPIRATORY_TRACT
  Filled 2012-11-30 (×2): qty 2.5
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
## 2012-11-30 NOTE — ED Provider Notes (Signed)
I saw and evaluated the patient, reviewed the resident's note and I agree with the findings and plan.  Clinically has PNA along with increased edema. Will admit to hospitalist for management of both.  Audree Camel, MD 11/30/12 2157

## 2012-11-30 NOTE — ED Notes (Addendum)
[name] is a 79 y.o. male Chief Complaint  Patient presents with  . Shortness of Breath  arrives via EMS from home with c/o sob that started yesterday. Recent hospitalization for pneumonia per family.   PTA: 10 mg albuterol  via nebulizer 0.5 atrovent via nebulizer 125 solumedrol IV 22 g L hand IV placed

## 2012-11-30 NOTE — Progress Notes (Signed)
RT completed Assessment.  Patient has no pulmonary history and takes no respiratory medications at home.  Former smoker per note quit in 1941.  Xray report showed a right effusion and questionable PNA or fluid.  Patient on 4L [place] at this time.  BBS are diminshed.  RT changed treatments from Q4H to TID.  Will monitor patient.

## 2012-11-30 NOTE — Progress Notes (Signed)
ANTIBIOTIC CONSULT NOTE - INITIAL  Pharmacy Consult for vancomycin Indication: rule out pneumonia  Allergies  Allergen Reactions  . Penicillins Swelling  . Sulfa Antibiotics Other (See Comments)    unknown    Patient Measurements: weight 97 kg, height 69 inches    Vital Signs: Temp: 99.5 F (37.5 C) (08/22 1500) Temp src: Rectal (08/22 1500) BP: 151/50 mmHg (08/22 1400) Pulse Rate: 96 (08/22 1400) Intake/Output from previous day:   Intake/Output from this shift:    Labs:  Recent Labs  11/30/12 1452  WBC 4.8  HGB 7.7*  PLT 285  CREATININE 1.52*   The CrCl is unknown because both a height and weight (above a minimum accepted value) are required for this calculation. No results found for this basename: VANCOTROUGH, VANCOPEAK, VANCORANDOM, GENTTROUGH, GENTPEAK, GENTRANDOM, TOBRATROUGH, TOBRAPEAK, TOBRARND, AMIKACINPEAK, AMIKACINTROU, AMIKACIN,  in the last 72 hours   Microbiology: No results found for this or any previous visit (from the past 720 hour(s)).  Medical History: Past Medical History  Diagnosis Date  . Diabetes mellitus   . Hypertension   . Macular degeneration   . Bladder tumor 1998  . Cancer 2001    bladder  . Atrial fibrillation   . Peripheral vascular disease   . Hyperlipidemia   . Anemia   . TIA (transient ischemic attack)   . Low back pain    Assessment: Patient's a 79 y.o M presented to the ED with c/o SOB, wheezing, and cough. "Chronic interstitial lung markings"-- edema vs PNA.  To start vancomycin and aztreonam for broad coverage.  Goal of Therapy:  Vancomycin trough level 15-20 mcg/ml  Plan:  1) vancomycin 1250mg  IV q24h (for 8 days) 2) cont aztreonam 2gm q8h  Harpreet Pompey P 11/30/2012,4:10 PM

## 2012-11-30 NOTE — Progress Notes (Signed)
Attempted to call for report, Wes, Charity fundraiser.  Unable to give report at this time due to pt transport.  Nurse to try again when finished.
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
## 2012-12-01 ENCOUNTER — Inpatient Hospital Stay (HOSPITAL_COMMUNITY): Payer: Medicare HMO

## 2012-12-01 LAB — CBC WITH DIFFERENTIAL/PLATELET
Basophils Absolute: 0 10*3/uL (ref 0.0–0.1)
Lymphocytes Relative: 10 % — ABNORMAL LOW (ref 12–46)
Lymphs Abs: 0.7 10*3/uL (ref 0.7–4.0)
Neutro Abs: 5.5 10*3/uL (ref 1.7–7.7)
Platelets: 160 10*3/uL (ref 150–400)
RBC: 2.26 MIL/uL — ABNORMAL LOW (ref 4.22–5.81)
RDW: 20.5 % — ABNORMAL HIGH (ref 11.5–15.5)
WBC: 6.7 10*3/uL (ref 4.0–10.5)

## 2012-12-01 LAB — COMPREHENSIVE METABOLIC PANEL
Alkaline Phosphatase: 120 U/L — ABNORMAL HIGH (ref 39–117)
BUN: 19 mg/dL (ref 6–23)
Chloride: 106 mEq/L (ref 96–112)
Creatinine, Ser: 1.88 mg/dL — ABNORMAL HIGH (ref 0.50–1.35)
GFR calc Af Amer: 34 mL/min — ABNORMAL LOW (ref >90)
Glucose, Bld: 138 mg/dL — ABNORMAL HIGH (ref 70–99)
Potassium: 3.7 mEq/L (ref 3.5–5.1)
Total Bilirubin: 0.3 mg/dL (ref 0.3–1.2)
Total Protein: 6.7 g/dL (ref 6.0–8.3)

## 2012-12-01 LAB — IRON AND TIBC
Saturation Ratios: 36 % (ref 20–55)
UIBC: 129 ug/dL (ref 125–400)

## 2012-12-01 LAB — GLUCOSE, CAPILLARY

## 2012-12-01 LAB — LEGIONELLA ANTIGEN, URINE

## 2012-12-01 LAB — PREPARE RBC (CROSSMATCH)

## 2012-12-01 LAB — TROPONIN I: Troponin I: <0.3 ng/mL (ref <0.30)

## 2012-12-01 MED ORDER — POTASSIUM CHLORIDE CRYS ER 20 MEQ PO TBCR
20.0000 meq | EXTENDED_RELEASE_TABLET | Freq: Two times a day (BID) | ORAL | Status: DC
  Administered 2012-12-01 – 2012-12-05 (×10): 20 meq via ORAL
  Filled 2012-12-01 (×14): qty 1

## 2012-12-01 MED ORDER — FUROSEMIDE 10 MG/ML IJ SOLN
20.0000 mg | Freq: Once | INTRAMUSCULAR | Status: AC
  Administered 2012-12-01: 20 mg via INTRAVENOUS
  Filled 2012-12-01: qty 2

## 2012-12-01 MED ORDER — FUROSEMIDE 10 MG/ML IJ SOLN
40.0000 mg | Freq: Every day | INTRAMUSCULAR | Status: DC
  Administered 2012-12-01: 40 mg via INTRAVENOUS
  Filled 2012-12-01 (×2): qty 4

## 2012-12-01 NOTE — Progress Notes (Signed)
  Echocardiogram 2D Echocardiogram has been performed.  [name] 12/01/2012, 12:47 PM

## 2012-12-01 NOTE — Progress Notes (Signed)
Subjective: Mr. Hellenbrand presents with congested cough over the past week with increasing shortness of breath.  He reports to me that his phlegm has been yellowish.  He does have a history of heart failure.  BNP is quite elevated.  No leukocytosis but he has been feeling slightly warm.  Still feels short of breath and is orthopneic.  2-D echocardiogram is pending.  Antibiotics have been started for the possibility of pneumonia for bilateral lower lobe infiltrates and right upper lobe infiltrate which could all be CHF.  Patient is markedly anemic and to have high output CHF from anemia and transfusion may be quite helpful.  He is not on chronic Aranesp  Objective: Weight change:   Intake/Output Summary (Last 24 hours) at 12/01/12 1034 Last data filed at 12/01/12 0640  Gross per 24 hour  Intake    790 ml  Output      0 ml  Net    790 ml   Filed Vitals:   11/30/12 2021 12/01/12 0300 12/01/12 0500 12/01/12 0916  BP:   158/74   Pulse:   85   Temp:   97.5 F (36.4 C)   TempSrc:   Oral   Resp:   20   Height:      Weight:  104 kg (229 lb 4.5 oz)    SpO2: 95%  95% 97%    General Appearance: Alert, cooperative, no distress, appears stated age Neck:   No definite JVD Lungs: Rales in bases but quiet Heart: Regular rate and rhythm, S1 and S2 normal, no murmur, rub or gallop Abdomen: Soft, non-tender, bowel sounds active all four quadrants, no masses, no organomegaly Extremities: Extremities normal, atraumatic, no cyanosis or significant peripheral edema Neuro: Alert, conversive, decreased hearing, nonfocal  Lab Results: Results for orders placed during the hospital encounter of 11/30/12 (from the past 48 hour(s))  TROPONIN I     Status: None   Collection Time    11/30/12  2:52 PM      Result Value Range   Troponin I <0.30  <0.30 ng/mL   Comment:            Due to the release kinetics of cTnI,     a negative result within the first hours     of the onset of symptoms does not rule out    myocardial infarction with certainty.     If myocardial infarction is still suspected,     repeat the test at appropriate intervals.  CBC WITH DIFFERENTIAL     Status: Abnormal   Collection Time    11/30/12  2:52 PM      Result Value Range   WBC 4.8  4.0 - 10.5 K/uL   RBC 2.40 (*) 4.22 - 5.81 MIL/uL   Hemoglobin 7.7 (*) 13.0 - 17.0 g/dL   HCT 45.4 (*) 09.8 - 11.9 %   MCV 94.2  78.0 - 100.0 fL   MCH 32.1  26.0 - 34.0 pg   MCHC 34.1  30.0 - 36.0 g/dL   RDW 14.7 (*) 82.9 - 56.2 %   Platelets 285  150 - 400 K/uL   Neutrophils Relative % 83 (*) 43 - 77 %   Neutro Abs 4.0  1.7 - 7.7 K/uL   Lymphocytes Relative 13  12 - 46 %   Lymphs Abs 0.6 (*) 0.7 - 4.0 K/uL   Monocytes Relative 3  3 - 12 %   Monocytes Absolute 0.1  0.1 - 1.0 K/uL  Eosinophils Relative 1  0 - 5 %   Eosinophils Absolute 0.0  0.0 - 0.7 K/uL   Basophils Relative 0  0 - 1 %   Basophils Absolute 0.0  0.0 - 0.1 K/uL  BASIC METABOLIC PANEL     Status: Abnormal   Collection Time    11/30/12  2:52 PM      Result Value Range   Sodium 140  135 - 145 mEq/L   Potassium 2.3 (*) 3.5 - 5.1 mEq/L   Comment: CRITICAL RESULT CALLED TO, READ BACK BY AND VERIFIED WITH:     W [facility] 1555 11/30/12 D BRADLEY   Chloride 103  96 - 112 mEq/L   CO2 25  19 - 32 mEq/L   Glucose, Bld 258 (*) 70 - 99 mg/dL   BUN 13  6 - 23 mg/dL   Creatinine, Ser 2.13 (*) 0.50 - 1.35 mg/dL   Calcium 8.2 (*) 8.4 - 10.5 mg/dL   GFR calc non Af Amer 38 (*) >90 mL/min   GFR calc Af Amer 44 (*) >90 mL/min   Comment: (NOTE)     The eGFR has been calculated using the CKD EPI equation.     This calculation has not been validated in all clinical situations.     eGFR's persistently <90 mL/min signify possible Chronic Kidney     Disease.  PRO B NATRIURETIC PEPTIDE     Status: Abnormal   Collection Time    11/30/12  2:52 PM      Result Value Range   Pro B Natriuretic peptide (BNP) 3290.0 (*) 0 - 450 pg/mL  LACTIC ACID, PLASMA     Status: Abnormal   Collection  Time    11/30/12  2:52 PM      Result Value Range   Lactic Acid, Venous 3.9 (*) 0.5 - 2.2 mmol/L  HIV ANTIBODY (ROUTINE TESTING)     Status: None   Collection Time    11/30/12  5:43 PM      Result Value Range   HIV NON REACTIVE  NON REACTIVE   Comment: Performed at [facility]  HEMOGLOBIN A1C     Status: Abnormal   Collection Time    11/30/12  5:43 PM      Result Value Range   Hemoglobin A1C 6.6 (*) <5.7 %   Comment: (NOTE)                                                                               According to the ADA Clinical Practice Recommendations for 2011, when     HbA1c is used as a screening test:      >=6.5%   Diagnostic of Diabetes Mellitus               (if abnormal result is confirmed)     5.7-6.4%   Increased risk of developing Diabetes Mellitus     References:Diagnosis and Classification of Diabetes Mellitus,Diabetes     Care,2011,34(Suppl 1):S62-S69 and Standards of Medical Care in             Diabetes - 2011,Diabetes Care,2011,34 (Suppl 1):S11-S61.   Mean Plasma Glucose 143 (*) <117 mg/dL   Comment: Performed  at [facility]  URINALYSIS, ROUTINE W REFLEX MICROSCOPIC     Status: Abnormal   Collection Time    11/30/12  7:52 PM      Result Value Range   Color, Urine YELLOW  YELLOW   APPearance TURBID (*) CLEAR   Specific Gravity, Urine 1.021  1.005 - 1.030   pH 5.0  5.0 - 8.0   Glucose, UA NEGATIVE  NEGATIVE mg/dL   Hgb urine dipstick MODERATE (*) NEGATIVE   Bilirubin Urine NEGATIVE  NEGATIVE   Ketones, ur 15 (*) NEGATIVE mg/dL   Protein, ur 161 (*) NEGATIVE mg/dL   Urobilinogen, UA 1.0  0.0 - 1.0 mg/dL   Nitrite NEGATIVE  NEGATIVE   Leukocytes, UA LARGE (*) NEGATIVE  URINE MICROSCOPIC-ADD ON     Status: Abnormal   Collection Time    11/30/12  7:52 PM      Result Value Range   Squamous Epithelial / LPF MANY (*) RARE   WBC, UA TOO NUMEROUS TO COUNT  <3 WBC/hpf   RBC / HPF 3-6  <3 RBC/hpf   Bacteria, UA MANY (*) RARE   Casts HYALINE  CASTS (*) NEGATIVE   Urine-Other MUCOUS PRESENT    TROPONIN I     Status: None   Collection Time    11/30/12  8:43 PM      Result Value Range   Troponin I <0.30  <0.30 ng/mL   Comment:            Due to the release kinetics of cTnI,     a negative result within the first hours     of the onset of symptoms does not rule out     myocardial infarction with certainty.     If myocardial infarction is still suspected,     repeat the test at appropriate intervals.  BASIC METABOLIC PANEL     Status: Abnormal   Collection Time    11/30/12  8:43 PM      Result Value Range   Sodium 140  135 - 145 mEq/L   Potassium 3.0 (*) 3.5 - 5.1 mEq/L   Chloride 104  96 - 112 mEq/L   CO2 22  19 - 32 mEq/L   Glucose, Bld 295 (*) 70 - 99 mg/dL   BUN 16  6 - 23 mg/dL   Creatinine, Ser 0.96 (*) 0.50 - 1.35 mg/dL   Calcium 8.2 (*) 8.4 - 10.5 mg/dL   GFR calc non Af Amer 35 (*) >90 mL/min   GFR calc Af Amer 41 (*) >90 mL/min   Comment: (NOTE)     The eGFR has been calculated using the CKD EPI equation.     This calculation has not been validated in all clinical situations.     eGFR's persistently <90 mL/min signify possible Chronic Kidney     Disease.  GLUCOSE, CAPILLARY     Status: Abnormal   Collection Time    11/30/12 10:03 PM      Result Value Range   Glucose-Capillary 288 (*) 70 - 99 mg/dL  COMPREHENSIVE METABOLIC PANEL     Status: Abnormal   Collection Time    12/01/12  5:00 AM      Result Value Range   Sodium 142  135 - 145 mEq/L   Potassium 3.7  3.5 - 5.1 mEq/L   Comment: DELTA CHECK NOTED   Chloride 106  96 - 112 mEq/L   CO2 24  19 - 32 mEq/L   Glucose,  Bld 138 (*) 70 - 99 mg/dL   BUN 19  6 - 23 mg/dL   Creatinine, Ser 1.61 (*) 0.50 - 1.35 mg/dL   Calcium 8.6  8.4 - 09.6 mg/dL   Total Protein 6.7  6.0 - 8.3 g/dL   Albumin 2.2 (*) 3.5 - 5.2 g/dL   AST 17  0 - 37 U/L   ALT 10  0 - 53 U/L   Alkaline Phosphatase 120 (*) 39 - 117 U/L   Total Bilirubin 0.3  0.3 - 1.2 mg/dL   GFR calc non  Af Amer 29 (*) >90 mL/min   GFR calc Af Amer 34 (*) >90 mL/min   Comment: (NOTE)     The eGFR has been calculated using the CKD EPI equation.     This calculation has not been validated in all clinical situations.     eGFR's persistently <90 mL/min signify possible Chronic Kidney     Disease.  CBC WITH DIFFERENTIAL     Status: Abnormal   Collection Time    12/01/12  5:00 AM      Result Value Range   WBC 6.7  4.0 - 10.5 K/uL   RBC 2.26 (*) 4.22 - 5.81 MIL/uL   Hemoglobin 7.1 (*) 13.0 - 17.0 g/dL   HCT 04.5 (*) 40.9 - 81.1 %   MCV 94.7  78.0 - 100.0 fL   MCH 31.4  26.0 - 34.0 pg   MCHC 33.2  30.0 - 36.0 g/dL   RDW 91.4 (*) 78.2 - 95.6 %   Platelets 160  150 - 400 K/uL   Comment: REPEATED TO VERIFY   Neutrophils Relative % 83 (*) 43 - 77 %   Neutro Abs 5.5  1.7 - 7.7 K/uL   Lymphocytes Relative 10 (*) 12 - 46 %   Lymphs Abs 0.7  0.7 - 4.0 K/uL   Monocytes Relative 6  3 - 12 %   Monocytes Absolute 0.4  0.1 - 1.0 K/uL   Eosinophils Relative 0  0 - 5 %   Eosinophils Absolute 0.0  0.0 - 0.7 K/uL   Basophils Relative 0  0 - 1 %   Basophils Absolute 0.0  0.0 - 0.1 K/uL  TROPONIN I     Status: None   Collection Time    12/01/12  5:00 AM      Result Value Range   Troponin I <0.30  <0.30 ng/mL   Comment:            Due to the release kinetics of cTnI,     a negative result within the first hours     of the onset of symptoms does not rule out     myocardial infarction with certainty.     If myocardial infarction is still suspected,     repeat the test at appropriate intervals.  GLUCOSE, CAPILLARY     Status: Abnormal   Collection Time    12/01/12  8:02 AM      Result Value Range   Glucose-Capillary 117 (*) 70 - 99 mg/dL    Studies/Results: Dg Chest 2 View  11/30/2012   *RADIOLOGY REPORT*  Clinical Data: Short of breath.  Weakness.  History of pneumonia.  CHEST - 2 VIEW  Comparison: 10/24/2012.  09/17/2012.  Findings: Heart size is mildly enlarged on a chronic basis.  There are  chronic interstitial markings which appears somewhat more prominent than on the previous studies, particularly at the lung bases and in the right upper  lobe.  This could represent worsening fibrosis, but the possibility of interstitial edema and/or pneumonia does exist.  Chronic pleural calcifications are evident bilaterally.  There is pleural fluid on the right.  IMPRESSION: Chronic interstitial lung markings.  Increased prominence of the markings in both lower lobes and in the right upper lobe.  This could be due to edema or pneumonia.  There is a pleural effusion on the right.  The findings are slightly worsened since July.   Original Report Authenticated By: Paulina Fusi, M.D.   Medications: Scheduled Meds: . ipratropium  0.5 mg Nebulization TID   And  . albuterol  2.5 mg Nebulization TID  . aspirin EC  81 mg Oral q morning - 10a  . aztreonam  2 g Intravenous Q8H  . beta carotene w/minerals  1 tablet Oral q morning - 10a  . heparin  5,000 Units Subcutaneous Q8H  . insulin aspart  0-5 Units Subcutaneous QHS  . insulin aspart  0-9 Units Subcutaneous TID WC  . iron polysaccharides  150 mg Oral Daily  . isosorbide mononitrate  30 mg Oral Daily  . lisinopril  10 mg Oral Daily  . metoprolol succinate  25 mg Oral QPC breakfast  . potassium chloride  40 mEq Oral Once  . tamsulosin  0.4 mg Oral QPC breakfast  . vancomycin  1,250 mg Intravenous Q24H   Continuous Infusions:  PRN Meds:.albuterol, ipratropium, nitroGLYCERIN  Assessment/Plan:  Principal Problem:  Pulmonary infiltrate on chest x-ray  Active Problems:  Atrial fibrillation  Anemia  CHF (congestive heart failure)  Type II or unspecified type diabetes mellitus with renal manifestations, not stated as uncontrolled(250.40)   1. Acute SOB with infiltrate on CXR - he has a combination of bronchitis and CHF.  Reports significant orthopnea.  Without elevated white count, and do not suspect pneumonia asthma to CHF but bronchitis certainly  could be present.  Anemia may be complicating CHF.  We'll plan to transfuse and give IV Lasix and KVO fluids otherwise to see if that symptomatically helps.  Transthoracic cardiac echo pending - In the short term, will treat for HCA bronchitis/pneumonia (recent hospitalization) with Vancomycin and Aztreonam given his allergies  - Check TTE  - Follow strict I/O and weight.  KVO fluids and start 40 milligrams of IV Lasix daily but also transfuse 1 unit of packed red cells - Followup on urine S pneumo and legionella, HIV  - Cycle CE - followup on - AML  - O2 as needed  - PRN duonebs  - Continue lisinopril, metoprolol  2. AKI, improved  - Previous AKI from admission for UTI, actually improved but still above baseline  - He is on low fluids now, will encourage PO intake and avoid fluids given concern for fluid overload  - Monitor daily  3. Hypokalemia  -  Replaced and continue to follow  4. T2DM  - On glipizide, will hold  - SSI  5. Chronic anemia  - Appears stable at baseline, unclear etiology but thought to be due to CKD. Likely component of age as well and low BM turnover.  Hemoccult stools and transfuse 1 unit - anemia may be contributing to heart failure 6. Dysuria, penile discharge - his condom catheter now - wound culture - followup on - will be on Abx as above.  Code Status: Full, confirmed with patient. Son Corliss Marcus [phone])  Family Communication:  None direct as yet  Disposition Plan: Home pending medical stability.     LOS: 1 day  Pearla Dubonnet, MD 12/01/2012, 10:34 AM

## 2012-12-02 ENCOUNTER — Inpatient Hospital Stay (HOSPITAL_COMMUNITY): Payer: Medicare HMO

## 2012-12-02 LAB — GLUCOSE, CAPILLARY
Glucose-Capillary: 170 mg/dL — ABNORMAL HIGH (ref 70–99)
Glucose-Capillary: 238 mg/dL — ABNORMAL HIGH (ref 70–99)

## 2012-12-02 LAB — BASIC METABOLIC PANEL
BUN: 22 mg/dL (ref 6–23)
CO2: 27 mEq/L (ref 19–32)
Calcium: 8.2 mg/dL — ABNORMAL LOW (ref 8.4–10.5)
GFR calc non Af Amer: 34 mL/min — ABNORMAL LOW (ref >90)
Glucose, Bld: 132 mg/dL — ABNORMAL HIGH (ref 70–99)

## 2012-12-02 LAB — URINE CULTURE: Colony Count: >=100000

## 2012-12-02 LAB — CBC WITH DIFFERENTIAL/PLATELET
Eosinophils Absolute: 0.2 10*3/uL (ref 0.0–0.7)
Eosinophils Relative: 4 % (ref 0–5)
HCT: 25.2 % — ABNORMAL LOW (ref 39.0–52.0)
Hemoglobin: 8.3 g/dL — ABNORMAL LOW (ref 13.0–17.0)
Lymphs Abs: 1.5 10*3/uL (ref 0.7–4.0)
MCH: 31.4 pg (ref 26.0–34.0)
MCV: 95.5 fL (ref 78.0–100.0)
Monocytes Absolute: 0.5 10*3/uL (ref 0.1–1.0)
Monocytes Relative: 7 % (ref 3–12)
RBC: 2.64 MIL/uL — ABNORMAL LOW (ref 4.22–5.81)

## 2012-12-02 LAB — EXPECTORATED SPUTUM ASSESSMENT W GRAM STAIN, RFLX TO RESP C

## 2012-12-02 LAB — PREPARE RBC (CROSSMATCH)

## 2012-12-02 MED ORDER — FUROSEMIDE 40 MG PO TABS
40.0000 mg | ORAL_TABLET | Freq: Every day | ORAL | Status: DC
  Administered 2012-12-02 – 2012-12-03 (×2): 40 mg via ORAL
  Filled 2012-12-02 (×3): qty 1

## 2012-12-02 MED ORDER — LEVOFLOXACIN 500 MG PO TABS
500.0000 mg | ORAL_TABLET | Freq: Every day | ORAL | Status: DC
  Administered 2012-12-02: 500 mg via ORAL
  Filled 2012-12-02: qty 1

## 2012-12-02 MED ORDER — FUROSEMIDE 10 MG/ML IJ SOLN
20.0000 mg | Freq: Once | INTRAMUSCULAR | Status: AC
  Administered 2012-12-02: 20 mg via INTRAVENOUS
  Filled 2012-12-02 (×2): qty 2

## 2012-12-02 MED ORDER — LEVOFLOXACIN 250 MG PO TABS
250.0000 mg | ORAL_TABLET | Freq: Every day | ORAL | Status: DC
  Administered 2012-12-03: 250 mg via ORAL
  Filled 2012-12-02 (×2): qty 1

## 2012-12-02 NOTE — Progress Notes (Addendum)
Subjective: Mr. [name] feels much better today.  He did have one unit of blood transfused.  No fevers or chills.  Cough is better.  Feels like he could get out of bed today.  No chest pain.  2-D echocardiogram revealed moderate systolic dysfunction with EF 40-45% with mild aortic stenosis but no left ventricular wall motion abnormalities.  At home, orthopnea was a severe complaint  Objective: Weight change: 2.759 kg (6 lb 1.3 oz)  Intake/Output Summary (Last 24 hours) at 12/02/12 0934 Last data filed at 12/02/12 0600  Gross per 24 hour  Intake 858.33 ml  Output    650 ml  Net 208.33 ml   Filed Vitals:   12/01/12 2109 12/02/12 0312 12/02/12 0529 12/02/12 0933  BP: 136/80  167/66   Pulse: 71  94   Temp: 97.8 F (36.6 C)  98.8 F (37.1 C)   TempSrc: Oral  Oral   Resp: 20  20   Height:      Weight:   105 kg (231 lb 7.7 oz)   SpO2: 97% 97% 99% 99%   General Appearance: Alert, cooperative, no distress, appears stated age  Neck: No definite JVD  Lungs: Distant breath sounds throughout Heart: Regular rate and rhythm, S1 and S2 normal, no murmur, rub or gallop  Abdomen: Soft, non-tender, bowel sounds active all four quadrants, no masses, no organomegaly  Extremities: Extremities normal, atraumatic, no cyanosis or significant peripheral edema  Neuro: Alert, conversive, decreased hearing, nonfocal  Lab Results: Results for orders placed during the hospital encounter of 11/30/12 (from the past 48 hour(s))  TROPONIN I     Status: None   Collection Time    11/30/12  2:52 PM      Result Value Range   Troponin I <0.30  <0.30 ng/mL   Comment:            Due to the release kinetics of cTnI,     a negative result within the first hours     of the onset of symptoms does not rule out     myocardial infarction with certainty.     If myocardial infarction is still suspected,     repeat the test at appropriate intervals.  CBC WITH DIFFERENTIAL     Status: Abnormal   Collection Time   11/30/12  2:52 PM      Result Value Range   WBC 4.8  4.0 - 10.5 K/uL   RBC 2.40 (*) 4.22 - 5.81 MIL/uL   Hemoglobin 7.7 (*) 13.0 - 17.0 g/dL   HCT 16.1 (*) 09.6 - 04.5 %   MCV 94.2  78.0 - 100.0 fL   MCH 32.1  26.0 - 34.0 pg   MCHC 34.1  30.0 - 36.0 g/dL   RDW 40.9 (*) 81.1 - 91.4 %   Platelets 285  150 - 400 K/uL   Neutrophils Relative % 83 (*) 43 - 77 %   Neutro Abs 4.0  1.7 - 7.7 K/uL   Lymphocytes Relative 13  12 - 46 %   Lymphs Abs 0.6 (*) 0.7 - 4.0 K/uL   Monocytes Relative 3  3 - 12 %   Monocytes Absolute 0.1  0.1 - 1.0 K/uL   Eosinophils Relative 1  0 - 5 %   Eosinophils Absolute 0.0  0.0 - 0.7 K/uL   Basophils Relative 0  0 - 1 %   Basophils Absolute 0.0  0.0 - 0.1 K/uL  BASIC METABOLIC PANEL     Status:  Abnormal   Collection Time    11/30/12  2:52 PM      Result Value Range   Sodium 140  135 - 145 mEq/L   Potassium 2.3 (*) 3.5 - 5.1 mEq/L   Comment: CRITICAL RESULT CALLED TO, READ BACK BY AND VERIFIED WITH:     W [facility] 1555 11/30/12 D BRADLEY   Chloride 103  96 - 112 mEq/L   CO2 25  19 - 32 mEq/L   Glucose, Bld 258 (*) 70 - 99 mg/dL   BUN 13  6 - 23 mg/dL   Creatinine, Ser 9.60 (*) 0.50 - 1.35 mg/dL   Calcium 8.2 (*) 8.4 - 10.5 mg/dL   GFR calc non Af Amer 38 (*) >90 mL/min   GFR calc Af Amer 44 (*) >90 mL/min   Comment: (NOTE)     The eGFR has been calculated using the CKD EPI equation.     This calculation has not been validated in all clinical situations.     eGFR's persistently <90 mL/min signify possible Chronic Kidney     Disease.  PRO B NATRIURETIC PEPTIDE     Status: Abnormal   Collection Time    11/30/12  2:52 PM      Result Value Range   Pro B Natriuretic peptide (BNP) 3290.0 (*) 0 - 450 pg/mL  LACTIC ACID, PLASMA     Status: Abnormal   Collection Time    11/30/12  2:52 PM      Result Value Range   Lactic Acid, Venous 3.9 (*) 0.5 - 2.2 mmol/L  HIV ANTIBODY (ROUTINE TESTING)     Status: None   Collection Time    11/30/12  5:43 PM      Result  Value Range   HIV NON REACTIVE  NON REACTIVE   Comment: Performed at [facility]  HEMOGLOBIN A1C     Status: Abnormal   Collection Time    11/30/12  5:43 PM      Result Value Range   Hemoglobin A1C 6.6 (*) <5.7 %   Comment: (NOTE)                                                                               According to the ADA Clinical Practice Recommendations for 2011, when     HbA1c is used as a screening test:      >=6.5%   Diagnostic of Diabetes Mellitus               (if abnormal result is confirmed)     5.7-6.4%   Increased risk of developing Diabetes Mellitus     References:Diagnosis and Classification of Diabetes Mellitus,Diabetes     Care,2011,34(Suppl 1):S62-S69 and Standards of Medical Care in             Diabetes - 2011,Diabetes Care,2011,34 (Suppl 1):S11-S61.   Mean Plasma Glucose 143 (*) <117 mg/dL   Comment: Performed at [facility]  LEGIONELLA ANTIGEN, URINE     Status: None   Collection Time    11/30/12  7:52 PM      Result Value Range   Specimen Description URINE, RANDOM     Special Requests NONE  Legionella Antigen, Urine       Value: Negative for Legionella pneumophilia serogroup 1     Performed at [facility]   Report Status 12/01/2012 FINAL    URINALYSIS, ROUTINE W REFLEX MICROSCOPIC     Status: Abnormal   Collection Time    11/30/12  7:52 PM      Result Value Range   Color, Urine YELLOW  YELLOW   APPearance TURBID (*) CLEAR   Specific Gravity, Urine 1.021  1.005 - 1.030   pH 5.0  5.0 - 8.0   Glucose, UA NEGATIVE  NEGATIVE mg/dL   Hgb urine dipstick MODERATE (*) NEGATIVE   Bilirubin Urine NEGATIVE  NEGATIVE   Ketones, ur 15 (*) NEGATIVE mg/dL   Protein, ur 161 (*) NEGATIVE mg/dL   Urobilinogen, UA 1.0  0.0 - 1.0 mg/dL   Nitrite NEGATIVE  NEGATIVE   Leukocytes, UA LARGE (*) NEGATIVE  URINE MICROSCOPIC-ADD ON     Status: Abnormal   Collection Time    11/30/12  7:52 PM      Result Value Range   Squamous Epithelial /  LPF MANY (*) RARE   WBC, UA TOO NUMEROUS TO COUNT  <3 WBC/hpf   RBC / HPF 3-6  <3 RBC/hpf   Bacteria, UA MANY (*) RARE   Casts HYALINE CASTS (*) NEGATIVE   Urine-Other MUCOUS PRESENT    URINE CULTURE     Status: None   Collection Time    11/30/12  7:52 PM      Result Value Range   Specimen Description URINE, RANDOM     Special Requests NONE     Culture  Setup Time       Value: 12/01/2012 01:47     Performed at [facility] Count       Value: >=100,000 COLONIES/ML     Performed at [facility]   Culture       Value: Multiple bacterial morphotypes present, none predominant. Suggest appropriate recollection if clinically indicated.     Performed at [facility]   Report Status 12/02/2012 FINAL    TROPONIN I     Status: None   Collection Time    11/30/12  8:43 PM      Result Value Range   Troponin I <0.30  <0.30 ng/mL   Comment:            Due to the release kinetics of cTnI,     a negative result within the first hours     of the onset of symptoms does not rule out     myocardial infarction with certainty.     If myocardial infarction is still suspected,     repeat the test at appropriate intervals.  BASIC METABOLIC PANEL     Status: Abnormal   Collection Time    11/30/12  8:43 PM      Result Value Range   Sodium 140  135 - 145 mEq/L   Potassium 3.0 (*) 3.5 - 5.1 mEq/L   Chloride 104  96 - 112 mEq/L   CO2 22  19 - 32 mEq/L   Glucose, Bld 295 (*) 70 - 99 mg/dL   BUN 16  6 - 23 mg/dL   Creatinine, Ser 0.96 (*) 0.50 - 1.35 mg/dL   Calcium 8.2 (*) 8.4 - 10.5 mg/dL   GFR calc non Af Amer 35 (*) >90 mL/min   GFR calc Af Amer 41 (*) >90 mL/min  Comment: (NOTE)     The eGFR has been calculated using the CKD EPI equation.     This calculation has not been validated in all clinical situations.     eGFR's persistently <90 mL/min signify possible Chronic Kidney     Disease.  GLUCOSE, CAPILLARY     Status: Abnormal   Collection Time    11/30/12  10:03 PM      Result Value Range   Glucose-Capillary 288 (*) 70 - 99 mg/dL  COMPREHENSIVE METABOLIC PANEL     Status: Abnormal   Collection Time    12/01/12  5:00 AM      Result Value Range   Sodium 142  135 - 145 mEq/L   Potassium 3.7  3.5 - 5.1 mEq/L   Comment: DELTA CHECK NOTED   Chloride 106  96 - 112 mEq/L   CO2 24  19 - 32 mEq/L   Glucose, Bld 138 (*) 70 - 99 mg/dL   BUN 19  6 - 23 mg/dL   Creatinine, Ser 1.61 (*) 0.50 - 1.35 mg/dL   Calcium 8.6  8.4 - 09.6 mg/dL   Total Protein 6.7  6.0 - 8.3 g/dL   Albumin 2.2 (*) 3.5 - 5.2 g/dL   AST 17  0 - 37 U/L   ALT 10  0 - 53 U/L   Alkaline Phosphatase 120 (*) 39 - 117 U/L   Total Bilirubin 0.3  0.3 - 1.2 mg/dL   GFR calc non Af Amer 29 (*) >90 mL/min   GFR calc Af Amer 34 (*) >90 mL/min   Comment: (NOTE)     The eGFR has been calculated using the CKD EPI equation.     This calculation has not been validated in all clinical situations.     eGFR's persistently <90 mL/min signify possible Chronic Kidney     Disease.  CBC WITH DIFFERENTIAL     Status: Abnormal   Collection Time    12/01/12  5:00 AM      Result Value Range   WBC 6.7  4.0 - 10.5 K/uL   RBC 2.26 (*) 4.22 - 5.81 MIL/uL   Hemoglobin 7.1 (*) 13.0 - 17.0 g/dL   HCT 04.5 (*) 40.9 - 81.1 %   MCV 94.7  78.0 - 100.0 fL   MCH 31.4  26.0 - 34.0 pg   MCHC 33.2  30.0 - 36.0 g/dL   RDW 91.4 (*) 78.2 - 95.6 %   Platelets 160  150 - 400 K/uL   Comment: REPEATED TO VERIFY   Neutrophils Relative % 83 (*) 43 - 77 %   Neutro Abs 5.5  1.7 - 7.7 K/uL   Lymphocytes Relative 10 (*) 12 - 46 %   Lymphs Abs 0.7  0.7 - 4.0 K/uL   Monocytes Relative 6  3 - 12 %   Monocytes Absolute 0.4  0.1 - 1.0 K/uL   Eosinophils Relative 0  0 - 5 %   Eosinophils Absolute 0.0  0.0 - 0.7 K/uL   Basophils Relative 0  0 - 1 %   Basophils Absolute 0.0  0.0 - 0.1 K/uL  TROPONIN I     Status: None   Collection Time    12/01/12  5:00 AM      Result Value Range   Troponin I <0.30  <0.30 ng/mL    Comment:            Due to the release kinetics of cTnI,     a  negative result within the first hours     of the onset of symptoms does not rule out     myocardial infarction with certainty.     If myocardial infarction is still suspected,     repeat the test at appropriate intervals.  GLUCOSE, CAPILLARY     Status: Abnormal   Collection Time    12/01/12  8:02 AM      Result Value Range   Glucose-Capillary 117 (*) 70 - 99 mg/dL  GLUCOSE, CAPILLARY     Status: Abnormal   Collection Time    12/01/12 11:48 AM      Result Value Range   Glucose-Capillary 140 (*) 70 - 99 mg/dL  PREPARE RBC (CROSSMATCH)     Status: None   Collection Time    12/01/12 12:10 PM      Result Value Range   Order Confirmation ORDER PROCESSED BY BLOOD BANK    TYPE AND SCREEN     Status: None   Collection Time    12/01/12 12:10 PM      Result Value Range   ABO/RH(D) A POS     Antibody Screen NEG     Sample Expiration 12/04/2012     Unit Number [id number]     Blood Component Type RED CELLS,LR     Unit division 00     Status of Unit ISSUED     Transfusion Status OK TO TRANSFUSE     Crossmatch Result Compatible    IRON AND TIBC     Status: Abnormal   Collection Time    12/01/12 12:14 PM      Result Value Range   Iron 73  42 - 135 ug/dL   TIBC 119 (*) 147 - 829 ug/dL   Saturation Ratios 36  20 - 55 %   UIBC 129  125 - 400 ug/dL   Comment: Performed at [facility]  GLUCOSE, CAPILLARY     Status: Abnormal   Collection Time    12/01/12  5:04 PM      Result Value Range   Glucose-Capillary 141 (*) 70 - 99 mg/dL  GLUCOSE, CAPILLARY     Status: Abnormal   Collection Time    12/01/12  9:26 PM      Result Value Range   Glucose-Capillary 181 (*) 70 - 99 mg/dL   Comment 1 Notify RN    CULTURE, EXPECTORATED SPUTUM-ASSESSMENT     Status: None   Collection Time    12/02/12  2:46 AM      Result Value Range   Specimen Description SPUTUM     Special Requests NONE     Sputum evaluation        Value: THIS SPECIMEN IS ACCEPTABLE. RESPIRATORY CULTURE REPORT TO FOLLOW.   Report Status 12/02/2012 FINAL    CBC WITH DIFFERENTIAL     Status: Abnormal   Collection Time    12/02/12  5:00 AM      Result Value Range   WBC 6.6  4.0 - 10.5 K/uL   RBC 2.64 (*) 4.22 - 5.81 MIL/uL   Hemoglobin 8.3 (*) 13.0 - 17.0 g/dL   HCT 56.2 (*) 13.0 - 86.5 %   MCV 95.5  78.0 - 100.0 fL   MCH 31.4  26.0 - 34.0 pg   MCHC 32.9  30.0 - 36.0 g/dL   RDW 78.4 (*) 69.6 - 29.5 %   Platelets 261  150 - 400 K/uL   Comment: DELTA CHECK NOTED  REPEATED TO VERIFY   Neutrophils Relative % 66  43 - 77 %   Neutro Abs 4.4  1.7 - 7.7 K/uL   Lymphocytes Relative 22  12 - 46 %   Lymphs Abs 1.5  0.7 - 4.0 K/uL   Monocytes Relative 7  3 - 12 %   Monocytes Absolute 0.5  0.1 - 1.0 K/uL   Eosinophils Relative 4  0 - 5 %   Eosinophils Absolute 0.2  0.0 - 0.7 K/uL   Basophils Relative 1  0 - 1 %   Basophils Absolute 0.1  0.0 - 0.1 K/uL  BASIC METABOLIC PANEL     Status: Abnormal   Collection Time    12/02/12  5:00 AM      Result Value Range   Sodium 141  135 - 145 mEq/L   Potassium 3.5  3.5 - 5.1 mEq/L   Chloride 105  96 - 112 mEq/L   CO2 27  19 - 32 mEq/L   Glucose, Bld 132 (*) 70 - 99 mg/dL   BUN 22  6 - 23 mg/dL   Creatinine, Ser 4.54 (*) 0.50 - 1.35 mg/dL   Calcium 8.2 (*) 8.4 - 10.5 mg/dL   GFR calc non Af Amer 34 (*) >90 mL/min   GFR calc Af Amer 40 (*) >90 mL/min   Comment: (NOTE)     The eGFR has been calculated using the CKD EPI equation.     This calculation has not been validated in all clinical situations.     eGFR's persistently <90 mL/min signify possible Chronic Kidney     Disease.  PRO B NATRIURETIC PEPTIDE     Status: Abnormal   Collection Time    12/02/12  5:00 AM      Result Value Range   Pro B Natriuretic peptide (BNP) 3031.0 (*) 0 - 450 pg/mL  GLUCOSE, CAPILLARY     Status: Abnormal   Collection Time    12/02/12  8:03 AM      Result Value Range   Glucose-Capillary 109 (*) 70 - 99  mg/dL    Studies/Results: Dg Chest 2 View  12/01/2012   *RADIOLOGY REPORT*  Clinical Data: Pneumonia, fever, cough, history diabetes, hypertension, bladder cancer  CHEST - 2 VIEW  Comparison: 11/30/2012  Findings: Enlargement of cardiac silhouette. Tortuous aorta. Infiltrates are seen throughout both lungs greatest at right upper lobe and left base. This likely represents a combination of underlying chronic interstitial lung disease with superimposed acute infiltrates question edema versus infection. Small pleural effusions blunt the posterior costophrenic angles. No pneumothorax. Bones demineralized.  IMPRESSION: Acute infiltrates superimposed upon background chronic interstitial lung disease, question pneumonia versus edema.   Original Report Authenticated By: Ulyses Southward, M.D.   Dg Chest 2 View  11/30/2012   *RADIOLOGY REPORT*  Clinical Data: Short of breath.  Weakness.  History of pneumonia.  CHEST - 2 VIEW  Comparison: 10/24/2012.  09/17/2012.  Findings: Heart size is mildly enlarged on a chronic basis.  There are chronic interstitial markings which appears somewhat more prominent than on the previous studies, particularly at the lung bases and in the right upper lobe.  This could represent worsening fibrosis, but the possibility of interstitial edema and/or pneumonia does exist.  Chronic pleural calcifications are evident bilaterally.  There is pleural fluid on the right.  IMPRESSION: Chronic interstitial lung markings.  Increased prominence of the markings in both lower lobes and in the right upper lobe.  This could be due to edema  or pneumonia.  There is a pleural effusion on the right.  The findings are slightly worsened since July.   Original Report Authenticated By: Paulina Fusi, M.D.   Dg Chest Port 1 View  12/02/2012   *RADIOLOGY REPORT*  Clinical Data: CHF versus pneumonia  PORTABLE CHEST - 1 VIEW  Comparison: 12/01/2012  Findings: Coarse reticular and patchy airspace opacities are stable,  noted throughout the right lung and in the left perihilar and lower lung.  Again, this may be due to multifocal pneumonia or asymmetric edema superimposed on chronic interstitial thickening.  There is no pneumothorax.  IMPRESSION: Stable appearance from the previous day's study.  Persistent coarse reticular and patchy airspace opacities suggesting multifocal pneumonia or asymmetric edema superimposed on chronic interstitial thickening.   Original Report Authenticated By: Amie Portland, M.D.   Medications: Scheduled Meds: . ipratropium  0.5 mg Nebulization TID   And  . albuterol  2.5 mg Nebulization TID  . aspirin EC  81 mg Oral q morning - 10a  . aztreonam  2 g Intravenous Q8H  . beta carotene w/minerals  1 tablet Oral q morning - 10a  . furosemide  40 mg Intravenous Daily  . heparin  5,000 Units Subcutaneous Q8H  . insulin aspart  0-5 Units Subcutaneous QHS  . insulin aspart  0-9 Units Subcutaneous TID WC  . iron polysaccharides  150 mg Oral Daily  . isosorbide mononitrate  30 mg Oral Daily  . lisinopril  10 mg Oral Daily  . metoprolol succinate  25 mg Oral QPC breakfast  . potassium chloride  20 mEq Oral BID  . tamsulosin  0.4 mg Oral QPC breakfast  . vancomycin  1,250 mg Intravenous Q24H   Continuous Infusions:  PRN Meds:.albuterol, ipratropium, nitroGLYCERIN  Assessment/Plan: Principal Problem:  Pulmonary infiltrate on chest x-ray  Active Problems:  Atrial fibrillation  Anemia  CHF (congestive heart failure) - to moderate systolic with EF of 40-45% on echocardiogram 12/01/2012 Type II or unspecified type diabetes mellitus with renal manifestations, not stated as uncontrolled(250.40)   1. Acute SOB with infiltrate on CXR - he likely has a combination of bronchitis and CHF. Reports significant orthopnea at home. Without elevated white count I do not suspect pneumonia as much as CHF but bronchitis certainly could be present. Anemia is likely complicating CHF. We'll plan to transfuse  1 more unit packed red cells today and give IV Lasix 20 milligrams. - Will switch over to Levaquin therapy 250 milligrams daily for 7 days for bronchitis  - Follow strict I/O and weight.  Start 40 milligrams of Lasix orally daily but also transfuse 1 unit of packed red cells followed by 20 milligrams of IV Lasix x1 - Followup on urine S pneumo and legionella, HIV  - Cycled CEs have been negative  - O2 as needed  - PRN duonebs  - Continue lisinopril, metoprolol  2. AKI, improved  3. Hypokalemia  - Replaced and continue to follow  4. T2DM  - Consider resuming glipizide - Continue SSI  5. Chronic anemia  - Appears stable at baseline, unclear etiology but thought to be due to CKD. Likely component of age as well and probable low BM turnover. Will Hemoccult stool and transfuse 1 more unit of blood- anemia likely contributing to heart failure.  May be reasonable to arrange for Aranesp therapy as an outpatient through hematology  6. Dysuria, penile discharge - his condom catheter now  - wound culture - followup on  - We'll be changing to  Levaquin therapy for 7 days. 7.Systolic dysfunction, left ventricle - EF 40-45% 8.  Activity - out of bed to chair and have physical therapy evaluate  Code Status: Full - confirmed with son Teal [phone]) on day of admission Family Communication: None direct as yet  Disposition Plan: Home pending medical stability.    LOS: 2 days   Pearla Dubonnet, MD 12/02/2012, 9:34 AM

## 2012-12-02 NOTE — Progress Notes (Signed)
ANTIBIOTIC CONSULT NOTE - INITIAL  Pharmacy Consult for levofloxacin Indication: bronchitis  Allergies  Allergen Reactions  . Penicillins Swelling  . Sulfa Antibiotics Other (See Comments)    unknown    Patient Measurements: Height: 5\' 9"  (175.3 cm) Weight: 231 lb 7.7 oz (105 kg) IBW/kg (Calculated) : 70.7   Vital Signs: Temp: 98.8 F (37.1 C) (08/24 0529) Temp src: Oral (08/24 0529) BP: 167/66 mmHg (08/24 0529) Pulse Rate: 94 (08/24 0529) Intake/Output from previous day: 08/23 0701 - 08/24 0700 In: 858.3 [P.O.:200; Blood:258.3; IV Piggyback:400] Out: 650 [Urine:650] Intake/Output from this shift:    Labs:  Recent Labs  11/30/12 1452 11/30/12 2043 12/01/12 0500 12/02/12 0500  WBC 4.8  --  6.7 6.6  HGB 7.7*  --  7.1* 8.3*  PLT 285  --  160 261  CREATININE 1.52* 1.61* 1.88* 1.64*   Estimated Creatinine Clearance: 33.6 ml/min (by C-G formula based on Cr of 1.64). No results found for this basename: VANCOTROUGH, Leodis Binet, VANCORANDOM, GENTTROUGH, GENTPEAK, GENTRANDOM, TOBRATROUGH, TOBRAPEAK, TOBRARND, AMIKACINPEAK, AMIKACINTROU, AMIKACIN,  in the last 72 hours   Microbiology: Recent Results (from the past 720 hour(s))  URINE CULTURE     Status: None   Collection Time    11/30/12  7:52 PM      Result Value Range Status   Specimen Description URINE, RANDOM   Final   Special Requests NONE   Final   Culture  Setup Time     Final   Value: 12/01/2012 01:47     Performed at [facility] Count     Final   Value: >=100,000 COLONIES/ML     Performed at [facility]   Culture     Final   Value: Multiple bacterial morphotypes present, none predominant. Suggest appropriate recollection if clinically indicated.     Performed at [facility]   Report Status 12/02/2012 FINAL   Final  CULTURE, EXPECTORATED SPUTUM-ASSESSMENT     Status: None   Collection Time    12/02/12  2:46 AM      Result Value Range Status   Specimen Description  SPUTUM   Final   Special Requests NONE   Final   Sputum evaluation     Final   Value: THIS SPECIMEN IS ACCEPTABLE. RESPIRATORY CULTURE REPORT TO FOLLOW.   Report Status 12/02/2012 FINAL   Final    Medical History: Past Medical History  Diagnosis Date  . Diabetes mellitus   . Hypertension   . Macular degeneration   . Bladder tumor 1998  . Cancer 2001    bladder  . Atrial fibrillation   . Peripheral vascular disease   . Hyperlipidemia   . Anemia   . TIA (transient ischemic attack)   . Low back pain   . Pneumonia     Assessment 79 yo male with acute sob and infiltrate on CXR, suspected combination of bronchitis and CHF. Pharmacy asked to renally adjust antibiotics.  8/24 SCr. 1.64 CrCl 33.6 Patient received Levaquin 500mg  PO x 1   Plan:  Will adjust dose- Levaquin 250mg  PO daily starting tomorrow 8/25 for 6 days  Thanks for allowing me to take part in the care of this patient,  Britt Bottom B. Artelia Laroche, PharmD Clinical Pharmacist - Resident Pager: [phone] Phone: [phone] 12/02/2012 11:43 AM

## 2012-12-03 ENCOUNTER — Inpatient Hospital Stay (HOSPITAL_COMMUNITY): Payer: Medicare HMO

## 2012-12-03 LAB — CBC
HCT: 27.8 % — ABNORMAL LOW (ref 39.0–52.0)
Hemoglobin: 9.2 g/dL — ABNORMAL LOW (ref 13.0–17.0)
MCHC: 33.1 g/dL (ref 30.0–36.0)
MCV: 95.8 fL (ref 78.0–100.0)
Platelets: 264 10*3/uL (ref 150–400)
RDW: 19.4 % — ABNORMAL HIGH (ref 11.5–15.5)
RDW: 19.5 % — ABNORMAL HIGH (ref 11.5–15.5)
WBC: 6.2 10*3/uL (ref 4.0–10.5)
WBC: 7 10*3/uL (ref 4.0–10.5)

## 2012-12-03 LAB — PRO B NATRIURETIC PEPTIDE: Pro B Natriuretic peptide (BNP): 3276 pg/mL — ABNORMAL HIGH (ref 0–450)

## 2012-12-03 LAB — TYPE AND SCREEN
Antibody Screen: NEGATIVE
Unit division: 0

## 2012-12-03 LAB — BASIC METABOLIC PANEL
BUN: 27 mg/dL — ABNORMAL HIGH (ref 6–23)
Chloride: 106 mEq/L (ref 96–112)
GFR calc Af Amer: 38 mL/min — ABNORMAL LOW (ref >90)
GFR calc non Af Amer: 33 mL/min — ABNORMAL LOW (ref >90)
Potassium: 4 mEq/L (ref 3.5–5.1)
Sodium: 142 mEq/L (ref 135–145)

## 2012-12-03 LAB — RETICULOCYTES
Retic Count, Absolute: 75.1 10*3/uL (ref 19.0–186.0)
Retic Ct Pct: 2.4 % (ref 0.4–3.1)

## 2012-12-03 LAB — IRON AND TIBC: UIBC: 130 ug/dL (ref 125–400)

## 2012-12-03 LAB — GLUCOSE, CAPILLARY: Glucose-Capillary: 163 mg/dL — ABNORMAL HIGH (ref 70–99)

## 2012-12-03 MED ORDER — DARBEPOETIN ALFA-POLYSORBATE 60 MCG/0.3ML IJ SOLN
60.0000 ug | INTRAMUSCULAR | Status: DC
  Administered 2012-12-03: 60 ug via SUBCUTANEOUS
  Filled 2012-12-03: qty 0.3

## 2012-12-03 MED ORDER — ALBUTEROL SULFATE (5 MG/ML) 0.5% IN NEBU
2.5000 mg | INHALATION_SOLUTION | Freq: Four times a day (QID) | RESPIRATORY_TRACT | Status: DC
  Administered 2012-12-03 – 2012-12-07 (×15): 2.5 mg via RESPIRATORY_TRACT
  Filled 2012-12-03 (×15): qty 0.5

## 2012-12-03 MED ORDER — IPRATROPIUM BROMIDE 0.02 % IN SOLN
0.5000 mg | Freq: Four times a day (QID) | RESPIRATORY_TRACT | Status: DC
  Administered 2012-12-03 – 2012-12-07 (×15): 0.5 mg via RESPIRATORY_TRACT
  Filled 2012-12-03: qty 2.5
  Filled 2012-12-03 (×2): qty 5
  Filled 2012-12-03: qty 2.5
  Filled 2012-12-03 (×2): qty 7.5
  Filled 2012-12-03 (×3): qty 2.5
  Filled 2012-12-03: qty 7.5
  Filled 2012-12-03 (×3): qty 2.5
  Filled 2012-12-03: qty 7.5
  Filled 2012-12-03: qty 2.5

## 2012-12-03 NOTE — Progress Notes (Signed)
Attempted to contact physicians twice, with no return phone call, in relevance to patient's elevated BP (no prn ordered), continued coughing(no prn ordered), and small seepage of blood several hours after heparin injection at puncture site.  Will continue to monitor patient.

## 2012-12-03 NOTE — Progress Notes (Signed)
12/03/2012 1300 Pt active with [facility]. Isidoro Donning RN CCM Case Mgmt phone [phone]

## 2012-12-03 NOTE — Progress Notes (Signed)
Dr. Kevan Ny phoned and ordered portable chest xray.  He was informed of patient's persistent cough, seepage of rotated heparin injection site, and elevated BP for which patient received early metoprolol dose.  Will continue to monitor patient.

## 2012-12-03 NOTE — Evaluation (Signed)
Physical Therapy Evaluation Patient Details Name: [name] MRN: [id number] DOB: March 13, 1918 Today's Date: 12/03/2012 Time: 0926-1001 PT Time Calculation (min): 35 min  PT Assessment / Plan / Recommendation History of Present Illness  79 y.o. male admitted to [facility] on 11/30/12 from home with SOB.  Dx with combination of bronchitis and CHF.    Clinical Impression  The pt is limited by DOE 3/4 with exertion.  He also needs min to mod assist for all mobility without an assistive device.  He is a questionable historian.  O2 sats on 3 L O2 [place] remained in the mid to upper 90s despite increased DOE.  PT to follow actuely for deficits listed below.  HH only appropriate with 24 hour assist at discharge.      PT Assessment  Patient needs continued PT services    Follow Up Recommendations  Home health PT    Does the patient have the potential to tolerate intense rehabilitation     NA  Barriers to Discharge Decreased caregiver support Will need to confirm that pt's son could stay home with him if needed at discharge    Equipment Recommendations  Rolling walker with 5" wheels;Other (comment) (if we confirm with his son that he doesn't have one)    Recommendations for Other Services OT consult   Frequency Min 3X/week    Precautions / Restrictions Precautions Precautions: Fall Precaution Comments: monitor O2 and DOE.   Restrictions Weight Bearing Restrictions: No   Pertinent Vitals/Pain See vitals flow sheet.      Mobility  Bed Mobility Bed Mobility: Supine to Sit;Sitting - Scoot to Edge of Bed Supine to Sit: 3: Mod assist;With rails;HOB elevated Sitting - Scoot to Edge of Bed: With rail;4: Min assist Details for Bed Mobility Assistance: mod assist to support trunk during transition to sitting.  Pt using both arms to pull up and still needed mod assist at his trunk to get all the way to sitting.  He was able to progress bil legs EOB without external assist.   Transfers Transfers:  Sit to Stand;Stand to Sit;Stand Pivot Transfers Sit to Stand: 4: Min assist;From elevated surface;With upper extremity assist;With armrests;From bed;From chair/3-in-1 Stand to Sit: 3: Mod assist;With upper extremity assist;With armrests;To chair/3-in-1 Stand Pivot Transfers: 4: Min assist;From elevated surface;With armrests Details for Transfer Assistance: min to mod assist to stabilize pt for balance during transitions from sit to stand and to transfer into the recliner chair.  DOE 3/4 with O2 sats remaining in the mid to upper 90s on 3 L O2 [place] during mobility.   Ambulation/Gait Ambulation/Gait Assistance: Not tested (comment) (due to increased DOE with just sitting EOB.  Will progress)        PT Diagnosis: Difficulty walking;Abnormality of gait;Generalized weakness  PT Problem List: Decreased strength;Decreased activity tolerance;Decreased balance;Decreased mobility;Decreased cognition;Decreased knowledge of use of DME;Cardiopulmonary status limiting activity PT Treatment Interventions: DME instruction;Gait training;Stair training;Functional mobility training;Therapeutic activities;Therapeutic exercise;Balance training;Neuromuscular re-education;Cognitive remediation;Patient/family education     PT Goals(Current goals can be found in the care plan section) Acute Rehab PT Goals Patient Stated Goal: to get stronger, breathe better PT Goal Formulation: With patient Time For Goal Achievement: 12/17/12 Potential to Achieve Goals: Good  Visit Information  Last PT Received On: 12/03/12 Assistance Needed: +1 History of Present Illness: 79 y.o. male admitted to [facility] on 11/30/12 from home with SOB.  Dx with combination of bronchitis and CHF.         Prior Functioning  Home Living Family/patient expects  to be discharged to:: Private residence Living Arrangements: Children (reports his son lives with him) Available Help at Discharge: Family;Available 24 hours/day (per pt his son could take some  time off) Type of Home: House Home Access: Stairs to enter [employer] of Steps: 7 Entrance Stairs-Rails: Right;Left;Can reach both Home Layout: One level Home Equipment: None Additional Comments: I am not sure of the accuracy of what he is reporting.  It is different that what was listed last admission.   Prior Function Level of Independence: Independent Comments: per pt he did not walk with an assistive device.  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   Communication Communication: No difficulties    Cognition  Cognition Arousal/Alertness: Awake/alert Behavior During Therapy: WFL for tasks assessed/performed Overall Cognitive Status: Impaired/Different from baseline Area of Impairment: Orientation;Memory Orientation Level: Time ("i've been here two weeks" (3 days actually)) Memory: Decreased short-term memory General Comments: History given is very different from history collected last admission.  questionable historian.  No family available to confirm hx questions    Extremity/Trunk Assessment Upper Extremity Assessment Upper Extremity Assessment: Generalized weakness Lower Extremity Assessment Lower Extremity Assessment: Generalized weakness Cervical / Trunk Assessment Cervical / Trunk Assessment: Kyphotic   Balance Balance Balance Assessed: Yes Static Sitting Balance Static Sitting - Balance Support: Bilateral upper extremity supported;Feet supported Static Sitting - Level of Assistance: 5: Stand by assistance Static Standing Balance Static Standing - Balance Support: Bilateral upper extremity supported Static Standing - Level of Assistance: 4: Min assist;3: Mod assist Static Standing - Comment/# of Minutes: min-mod assist to stabilize in standing with bil upper extremity supported  Dynamic Standing Balance Dynamic Standing - Balance Support: Bilateral upper extremity supported Dynamic Standing - Level of Assistance: 4: Min  assist;3: Mod assist Dynamic Standing - Comments: min to mod assist during transition from bed to recliner chair.    End of Session PT - End of Session Equipment Utilized During Treatment: Oxygen Activity Tolerance: Patient limited by fatigue;Treatment limited secondary to medical complications (Comment);Other (comment) (limited by DOE) Patient left: in chair;with call bell/phone within reach;with chair alarm set Nurse Communication: Mobility status       Lurena Joiner B. Skylar Priest, PT, DPT [phone]   12/03/2012, 11:04 AM

## 2012-12-03 NOTE — Progress Notes (Signed)
MEDICATION RELATED CONSULT NOTE - INITIAL   Pharmacy Consult for Aranesp Indication: Anemia of chronic kidney disease  Allergies  Allergen Reactions  . Penicillins Swelling  . Sulfa Antibiotics Other (See Comments)    unknown    Patient Measurements: Height: 5\' 9"  (175.3 cm) Weight: 235 lb 7.2 oz (106.8 kg) IBW/kg (Calculated) : 70.7 Adjusted Body Weight: 106kg  Vital Signs: Temp: 99 F (37.2 C) (08/25 0500) Temp src: Oral (08/25 0500) BP: 171/93 mmHg (08/25 0500) Pulse Rate: 85 (08/25 0500) Intake/Output from previous day: 08/24 0701 - 08/25 0700 In: 540.8 [P.O.:240; Blood:300.8] Out: -  Intake/Output from this shift: Total I/O In: 240 [P.O.:240] Out: -   Labs:  Recent Labs  11/30/12 2043 12/01/12 0500 12/02/12 0500 12/03/12 0550  WBC  --  6.7 6.6 6.2  HGB  --  7.1* 8.3* 9.2*  HCT  --  21.4* 25.2* 27.8*  PLT  --  160 261 249  CREATININE 1.61* 1.88* 1.64*  --   ALBUMIN  --  2.2*  --   --   PROT  --  6.7  --   --   AST  --  17  --   --   ALT  --  10  --   --   ALKPHOS  --  120*  --   --   BILITOT  --  0.3  --   --    Estimated Creatinine Clearance: 33.9 ml/min (by C-G formula based on Cr of 1.64).   Microbiology: Recent Results (from the past 720 hour(s))  CULTURE, BLOOD (ROUTINE X 2)     Status: None   Collection Time    11/30/12  3:34 PM      Result Value Range Status   Specimen Description BLOOD ARM RIGHT   Final   Special Requests BOTTLES DRAWN AEROBIC ONLY 3CC   Final   Culture  Setup Time     Final   Value: 12/01/2012 00:57     Performed at [facility]   Culture     Final   Value:        BLOOD CULTURE RECEIVED NO GROWTH TO DATE CULTURE WILL BE HELD FOR 5 DAYS BEFORE ISSUING A FINAL NEGATIVE REPORT     Performed at [facility]   Report Status PENDING   Incomplete  CULTURE, BLOOD (ROUTINE X 2)     Status: None   Collection Time    11/30/12  3:40 PM      Result Value Range Status   Specimen Description BLOOD ARM RIGHT    Final   Special Requests BOTTLES DRAWN AEROBIC ONLY 3CC   Final   Culture  Setup Time     Final   Value: 12/01/2012 00:58     Performed at [facility]   Culture     Final   Value:        BLOOD CULTURE RECEIVED NO GROWTH TO DATE CULTURE WILL BE HELD FOR 5 DAYS BEFORE ISSUING A FINAL NEGATIVE REPORT     Performed at [facility]   Report Status PENDING   Incomplete  URINE CULTURE     Status: None   Collection Time    11/30/12  7:52 PM      Result Value Range Status   Specimen Description URINE, RANDOM   Final   Special Requests NONE   Final   Culture  Setup Time     Final   Value: 12/01/2012 01:47  Performed at [facility] Count     Final   Value: >=100,000 COLONIES/ML     Performed at [facility]   Culture     Final   Value: Multiple bacterial morphotypes present, none predominant. Suggest appropriate recollection if clinically indicated.     Performed at [facility]   Report Status 12/02/2012 FINAL   Final  CULTURE, EXPECTORATED SPUTUM-ASSESSMENT     Status: None   Collection Time    12/02/12  2:46 AM      Result Value Range Status   Specimen Description SPUTUM   Final   Special Requests NONE   Final   Sputum evaluation     Final   Value: THIS SPECIMEN IS ACCEPTABLE. RESPIRATORY CULTURE REPORT TO FOLLOW.   Report Status 12/02/2012 FINAL   Final    Medical History: Past Medical History  Diagnosis Date  . Diabetes mellitus   . Hypertension   . Macular degeneration   . Bladder tumor 1998  . Cancer 2001    bladder  . Atrial fibrillation   . Peripheral vascular disease   . Hyperlipidemia   . Anemia   . TIA (transient ischemic attack)   . Low back pain   . Pneumonia     Medications:  Scheduled:  . albuterol  2.5 mg Nebulization QID   And  . ipratropium  0.5 mg Nebulization QID  . aspirin EC  81 mg Oral q morning - 10a  . beta carotene w/minerals  1 tablet Oral q morning - 10a  . furosemide  40 mg Oral Daily   . heparin  5,000 Units Subcutaneous Q8H  . insulin aspart  0-5 Units Subcutaneous QHS  . insulin aspart  0-9 Units Subcutaneous TID WC  . iron polysaccharides  150 mg Oral Daily  . isosorbide mononitrate  30 mg Oral Daily  . levofloxacin  250 mg Oral Daily  . lisinopril  10 mg Oral Daily  . metoprolol succinate  25 mg Oral QPC breakfast  . potassium chloride  20 mEq Oral BID  . tamsulosin  0.4 mg Oral QPC breakfast    Assessment: 79yo male with anemia of chronic kidney disease and Hg < 10, to start Aranesp.  Goal of Therapy:  Hg ~13  Plan:  1.  Baseline Anemia panel and CBC 2.  Weekly CBC, retic 3.  Aranesp SQ q Mon  Marisue Humble, PharmD Clinical Pharmacist [facility]

## 2012-12-03 NOTE — Progress Notes (Signed)
Subjective: Feels better.  Objective: Vital signs in last 24 hours: Temp:  [98 F (36.7 C)-99 F (37.2 C)] 99 F (37.2 C) (08/25 0500) Pulse Rate:  [73-90] 85 (08/25 0500) Resp:  [18-20] 20 (08/25 0500) BP: (155-188)/(70-93) 171/93 mmHg (08/25 0500) SpO2:  [94 %-99 %] 98 % (08/25 0500) Weight:  [106.8 kg (235 lb 7.2 oz)] 106.8 kg (235 lb 7.2 oz) (08/25 0500) Weight change: 1.8 kg (3 lb 15.5 oz) Last BM Date: 12/02/12  Intake/Output from previous day: 08/24 0701 - 08/25 0700 In: 540.8 [P.O.:240; Blood:300.8] Out: -  Intake/Output this shift: Total I/O In: 240 [P.O.:240] Out: -   General appearance: alert and cooperative Resp: wheezes bilaterally Cardio: regular rate and rhythm, S1, S2 normal, no murmur, click, rub or gallop Extremities: extremities normal, atraumatic, no cyanosis or edema  Lab Results:  Recent Labs  12/01/12 0500 12/02/12 0500  WBC 6.7 6.6  HGB 7.1* 8.3*  HCT 21.4* 25.2*  PLT 160 261   BMET  Recent Labs  12/01/12 0500 12/02/12 0500  NA 142 141  K 3.7 3.5  CL 106 105  CO2 24 27  GLUCOSE 138* 132*  BUN 19 22  CREATININE 1.88* 1.64*  CALCIUM 8.6 8.2*    Studies/Results: Dg Chest 2 View  12/01/2012   *RADIOLOGY REPORT*  Clinical Data: Pneumonia, fever, cough, history diabetes, hypertension, bladder cancer  CHEST - 2 VIEW  Comparison: 11/30/2012  Findings: Enlargement of cardiac silhouette. Tortuous aorta. Infiltrates are seen throughout both lungs greatest at right upper lobe and left base. This likely represents a combination of underlying chronic interstitial lung disease with superimposed acute infiltrates question edema versus infection. Small pleural effusions blunt the posterior costophrenic angles. No pneumothorax. Bones demineralized.  IMPRESSION: Acute infiltrates superimposed upon background chronic interstitial lung disease, question pneumonia versus edema.   Original Report Authenticated By: Ulyses Southward, M.D.   Dg Chest Port 1  View  12/02/2012   *RADIOLOGY REPORT*  Clinical Data: CHF versus pneumonia  PORTABLE CHEST - 1 VIEW  Comparison: 12/01/2012  Findings: Coarse reticular and patchy airspace opacities are stable, noted throughout the right lung and in the left perihilar and lower lung.  Again, this may be due to multifocal pneumonia or asymmetric edema superimposed on chronic interstitial thickening.  There is no pneumothorax.  IMPRESSION: Stable appearance from the previous day's study.  Persistent coarse reticular and patchy airspace opacities suggesting multifocal pneumonia or asymmetric edema superimposed on chronic interstitial thickening.   Original Report Authenticated By: Amie Portland, M.D.    Medications: I have reviewed the patient's current medications.  Assessment/Plan: 1. Acute SOB with infiltrate on CXR - he likely has a combination of bronchitis and CHF.  - switched over to Levaquin therapy 250 milligrams daily for 7 days for bronchitis  - Follow strict I/O and weight. Started on 40 milligrams of Lasix orally daily but also transfused 2 unit of packed red cells-  - PRN duonebs  - Continue lisinopril, metoprolol  2. AKI, improved  3. Hypokalemia  - Replaced and continue to follow  4. T2DM  - Consider resuming glipizide  - Continue SSI  5. Chronic anemia  - Appears stable at baseline, likely secondary to CKD.  Have attempted to obtain aranesp for him recently as outpatient but denied by insurance.  Check ferritin and EPO level.  Aransesp x 1,  6. Dysuria, penile discharge - his condom catheter now  - wound culture - followup on  - We'll be changing to Levaquin therapy  for 7 days.  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 Activity - out of bed to chair and have physical therapy evaluate    LOS: 3 days   Mariacristina Aday JOSEPH 12/03/2012, 6:16 AM

## 2012-12-04 LAB — BASIC METABOLIC PANEL
BUN: 30 mg/dL — ABNORMAL HIGH (ref 6–23)
Calcium: 8.7 mg/dL (ref 8.4–10.5)
Creatinine, Ser: 1.52 mg/dL — ABNORMAL HIGH (ref 0.50–1.35)
GFR calc Af Amer: 44 mL/min — ABNORMAL LOW (ref >90)
GFR calc non Af Amer: 38 mL/min — ABNORMAL LOW (ref >90)
Potassium: 4.3 mEq/L (ref 3.5–5.1)

## 2012-12-04 LAB — CULTURE, RESPIRATORY W GRAM STAIN: Culture: NORMAL

## 2012-12-04 LAB — ERYTHROPOIETIN: Erythropoietin: 36.4 m[IU]/mL — ABNORMAL HIGH (ref 2.6–18.5)

## 2012-12-04 LAB — CBC WITH DIFFERENTIAL/PLATELET
Basophils Relative: 1 % (ref 0–1)
Eosinophils Absolute: 0.2 10*3/uL (ref 0.0–0.7)
Eosinophils Relative: 4 % (ref 0–5)
Hemoglobin: 9.7 g/dL — ABNORMAL LOW (ref 13.0–17.0)
MCH: 31.6 pg (ref 26.0–34.0)
MCHC: 33.4 g/dL (ref 30.0–36.0)
Monocytes Absolute: 0.7 10*3/uL (ref 0.1–1.0)
Monocytes Relative: 10 % (ref 3–12)
Neutrophils Relative %: 69 % (ref 43–77)

## 2012-12-04 LAB — GLUCOSE, CAPILLARY: Glucose-Capillary: 126 mg/dL — ABNORMAL HIGH (ref 70–99)

## 2012-12-04 LAB — CULTURE, ROUTINE-GENITAL

## 2012-12-04 MED ORDER — PREDNISONE 50 MG PO TABS
60.0000 mg | ORAL_TABLET | Freq: Every day | ORAL | Status: DC
  Administered 2012-12-04 – 2012-12-05 (×2): 60 mg via ORAL
  Filled 2012-12-04 (×4): qty 1

## 2012-12-04 MED ORDER — INSULIN GLARGINE 100 UNIT/ML ~~LOC~~ SOLN
10.0000 [IU] | Freq: Every day | SUBCUTANEOUS | Status: DC
  Administered 2012-12-04 – 2012-12-07 (×4): 10 [IU] via SUBCUTANEOUS
  Filled 2012-12-04 (×4): qty 0.1

## 2012-12-04 MED ORDER — FUROSEMIDE 10 MG/ML IJ SOLN
40.0000 mg | Freq: Two times a day (BID) | INTRAMUSCULAR | Status: DC
  Administered 2012-12-04 – 2012-12-05 (×4): 40 mg via INTRAVENOUS
  Filled 2012-12-04 (×7): qty 4

## 2012-12-04 MED ORDER — LEVOFLOXACIN IN D5W 250 MG/50ML IV SOLN
250.0000 mg | INTRAVENOUS | Status: DC
  Administered 2012-12-04 – 2012-12-06 (×3): 250 mg via INTRAVENOUS
  Filled 2012-12-04 (×4): qty 50

## 2012-12-04 NOTE — Progress Notes (Signed)
Dr. Earl Gala notified about pts BP of 180-60. No new orders given. Will continue to monitor.

## 2012-12-04 NOTE — Progress Notes (Signed)
Subjective: Feels about the same.  Objective: Vital signs in last 24 hours: Temp:  [98.4 F (36.9 C)-99.5 F (37.5 C)] 99.5 F (37.5 C) (08/26 0548) Pulse Rate:  [72-91] 91 (08/26 0548) Resp:  [20-31] 20 (08/26 0548) BP: (150-210)/(60-98) 180/60 mmHg (08/26 0616) SpO2:  [93 %-100 %] 100 % (08/26 0737) Weight:  [105 kg (231 lb 7.7 oz)] 105 kg (231 lb 7.7 oz) (08/26 0548) Weight change: -1.8 kg (-3 lb 15.5 oz) Last BM Date: 12/03/12  Intake/Output from previous day: 08/25 0701 - 08/26 0700 In: 720 [P.O.:720] Out: -  Intake/Output this shift:    General appearance: alert and cooperative Resp: wheezes bilaterally Cardio: irregularly irregular rhythm GI: soft, non-tender; bowel sounds normal; no masses,  no organomegaly Extremities: extremities normal, atraumatic, no cyanosis or edema  Lab Results:  Recent Labs  12/03/12 0953 12/04/12 0620  WBC 7.0 6.6  HGB 10.0* 9.7*  HCT 30.0* 29.0*  PLT 264 257   BMET  Recent Labs  12/03/12 0550 12/04/12 0620  NA 142 140  K 4.0 4.3  CL 106 104  CO2 30 31  GLUCOSE 120* 140*  BUN 27* 30*  CREATININE 1.69* 1.52*  CALCIUM 8.6 8.7    Studies/Results: Dg Chest Port 1 View  12/03/2012   CLINICAL DATA:  79 year old male with shortness of breath and cough.  EXAM: PORTABLE CHEST - 1 VIEW  COMPARISON:  A 07/29/2012 and earlier.  FINDINGS: Portable semi upright AP view at 0622 hrs. Continued bilateral coarse and interstitial pulmonary opacity, with progression in both lungs since 11/30/2012. Superimposed small right pleural effusion suspected. Stable cardiomegaly and mediastinal contours. No pneumothorax. Dense retrocardiac opacity also has progressed.  IMPRESSION: Worsening ventilation since 11/30/2012 with increased interstitial and dense retrocardiac opacity. Small right pleural effusion suspected. Top differential considerations are progressive pneumonia or edema/atelectasis.   Electronically Signed   By: Augusto Gamble   On: 12/03/2012  08:19    Medications: I have reviewed the patient's current medications.  Assessment/Plan: . Acute SOB with infiltrate on CXR - he likely has a combination of asthmatic bronchitis and CHF. CXR 8/25 shows worsening infiltrate and retrocardiac density - switchLevaquin therapy to IV and change lasix to iv. Add prednisione po -duonebs q6 - Continue lisinopril, metoprolol  2. AKI, improved likely at baseline 3. Hypokalemia  - Replaced and continue to follow  4. T2DM  - Consider resuming glipizide  - Continue SSI  5. Chronic anemia  - Appears stable at baseline, likely secondary to CKD. Have attempted to obtain aranesp for him recently as outpatient but denied by insurance. Check ferritin and EPO level. Aransesp x 1,  6. Dysuria, penile discharge - his condom catheter now  - wound culture -negative.  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

## 2012-12-04 NOTE — Progress Notes (Signed)
Told pt's blood pressures were elevated through the night. Gave pt metoprolol, lasix, and lisinopril with AM meds, BP recheck still 186/72(102) - lying down, resting. MD Valentina Lucks) paged and notified, no new orders at this time. Will re-assess in a few hours.   Delynn Flavin, RN

## 2012-12-04 NOTE — Clinical Social Work Note (Signed)
CSW received inappropriate referral for safety assessment for home (stairs etc.). RNCM notified of need for in home social worker or PT safety evaluation in home. CSW has given patient SCAT application and number to the [employer] through [employer] of [place]. CSW signing off.   Roddie Mc, [place], [place], [phone]

## 2012-12-05 LAB — GLUCOSE, CAPILLARY
Glucose-Capillary: 135 mg/dL — ABNORMAL HIGH (ref 70–99)
Glucose-Capillary: 148 mg/dL — ABNORMAL HIGH (ref 70–99)
Glucose-Capillary: 184 mg/dL — ABNORMAL HIGH (ref 70–99)

## 2012-12-05 LAB — BASIC METABOLIC PANEL
BUN: 35 mg/dL — ABNORMAL HIGH (ref 6–23)
CO2: 31 mEq/L (ref 19–32)
GFR calc non Af Amer: 41 mL/min — ABNORMAL LOW (ref >90)
Glucose, Bld: 155 mg/dL — ABNORMAL HIGH (ref 70–99)
Potassium: 4.2 mEq/L (ref 3.5–5.1)
Sodium: 140 mEq/L (ref 135–145)

## 2012-12-05 LAB — CBC
HCT: 30.6 % — ABNORMAL LOW (ref 39.0–52.0)
Hemoglobin: 10 g/dL — ABNORMAL LOW (ref 13.0–17.0)
MCH: 31.5 pg (ref 26.0–34.0)
MCHC: 32.7 g/dL (ref 30.0–36.0)
MCV: 96.5 fL (ref 78.0–100.0)
RBC: 3.17 MIL/uL — ABNORMAL LOW (ref 4.22–5.81)

## 2012-12-05 MED ORDER — INSULIN ASPART 100 UNIT/ML ~~LOC~~ SOLN
4.0000 [IU] | Freq: Three times a day (TID) | SUBCUTANEOUS | Status: DC
  Administered 2012-12-05: 4 [IU] via SUBCUTANEOUS
  Administered 2012-12-05: 19:00:00 via SUBCUTANEOUS
  Administered 2012-12-05 – 2012-12-07 (×6): 4 [IU] via SUBCUTANEOUS

## 2012-12-05 MED ORDER — INSULIN ASPART 100 UNIT/ML ~~LOC~~ SOLN
0.0000 [IU] | Freq: Every day | SUBCUTANEOUS | Status: DC
  Administered 2012-12-06: 4 [IU] via SUBCUTANEOUS

## 2012-12-05 MED ORDER — INSULIN ASPART 100 UNIT/ML ~~LOC~~ SOLN
0.0000 [IU] | Freq: Three times a day (TID) | SUBCUTANEOUS | Status: DC
  Administered 2012-12-05: 3 [IU] via SUBCUTANEOUS
  Administered 2012-12-05: 4 [IU] via SUBCUTANEOUS
  Administered 2012-12-05 – 2012-12-06 (×2): 3 [IU] via SUBCUTANEOUS
  Administered 2012-12-06 (×2): 4 [IU] via SUBCUTANEOUS
  Administered 2012-12-07: 11 [IU] via SUBCUTANEOUS
  Administered 2012-12-07: 4 [IU] via SUBCUTANEOUS

## 2012-12-05 NOTE — Progress Notes (Signed)
Subjective: Feels better, still some cough  Objective: Vital signs in last 24 hours: Temp:  [97.5 F (36.4 C)-98.8 F (37.1 C)] 97.5 F (36.4 C) (08/27 0510) Pulse Rate:  [71-95] 71 (08/27 0544) Resp:  [18] 18 (08/27 0544) BP: (151-195)/(70-94) 179/92 mmHg (08/27 0544) SpO2:  [95 %-100 %] 97 % (08/27 0544) Weight:  [104.5 kg (230 lb 6.1 oz)] 104.5 kg (230 lb 6.1 oz) (08/27 0510) Weight change: -0.5 kg (-1 lb 1.6 oz) Last BM Date: 12/03/12  Intake/Output from previous day: 08/26 0701 - 08/27 0700 In: 998 [P.O.:948; IV Piggyback:50] Out: -  Intake/Output this shift:    General appearance: alert and cooperative Resp: wheezes bilaterally Cardio: regular rate and rhythm, S1, S2 normal, no murmur, click, rub or gallop Extremities: extremities normal, atraumatic, no cyanosis or edema  Lab Results:  Recent Labs  12/03/12 0953 12/04/12 0620  WBC 7.0 6.6  HGB 10.0* 9.7*  HCT 30.0* 29.0*  PLT 264 257   BMET  Recent Labs  12/03/12 0550 12/04/12 0620  NA 142 140  K 4.0 4.3  CL 106 104  CO2 30 31  GLUCOSE 120* 140*  BUN 27* 30*  CREATININE 1.69* 1.52*  CALCIUM 8.6 8.7    Studies/Results: No results found.  Medications: I have reviewed the patient's current medications.  Assessment/Plan: 1.asthmatic bronchitis/pneumonia and CHF. CXR 8/25 showed worsening infiltrate and retrocardiac density  - continue Levaquin therapy IV and  lasix to iv. Added prednisione po  -duonebs q6  - Continue lisinopril, metoprolol  2. AKI, improved likely at baseline, pending today 3. Hypokalemia  - Replaced and continue to follow  4. T2DM sugars rising on steroids - continue lantus - increase  SSI  5. Chronic anemia has received 2U PRBC and aranesp, increase iron, likely oupt hematology evaluation.  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 Elevated BP follow for now 9.Disposition plan on home with home PT    LOS: 5 days   [name]  [name] 12/05/2012, 7:23 AM

## 2012-12-05 NOTE — Progress Notes (Signed)
Respiratory therapy note- albuterol and atrovent given at this time, routine therapy

## 2012-12-05 NOTE — Progress Notes (Signed)
ANTIBIOTIC CONSULT NOTE - FOLLOW UP  Pharmacy Consult for Levaquin, and Aranesp Indication: bronchitis/pneumonia and anemia of CKD  Allergies  Allergen Reactions  . Penicillins Swelling  . Sulfa Antibiotics Other (See Comments)    unknown    Patient Measurements: Height: 5\' 9"  (175.3 cm) Weight: 230 lb 6.1 oz (104.5 kg) IBW/kg (Calculated) : 70.7  Vital Signs: Temp: 97.5 F (36.4 C) (08/27 0510) Temp src: Oral (08/27 0510) BP: 179/92 mmHg (08/27 0544) Pulse Rate: 71 (08/27 0544) Intake/Output from previous day: 08/26 0701 - 08/27 0700 In: 998 [P.O.:948; IV Piggyback:50] Out: -   Labs:  Recent Labs  12/03/12 0550 12/03/12 0953 12/04/12 0620 12/05/12 0705  WBC 6.2 7.0 6.6 7.9  HGB 9.2* 10.0* 9.7* 10.0*  PLT 249 264 257 241  CREATININE 1.69*  --  1.52* 1.43*   Estimated Creatinine Clearance: 38.4 ml/min (by C-G formula based on Cr of 1.43).  Assessment:  Day # 4 of 7 initially planned of Levaquin. Changed from PO to IV on 8/26, Prednisone added. Levaquin dose remains appropriate.    Aranesp 60 mcg SQ weekly begun on 8/25. Hgb up to 10.0. Retic count 2.4%. Fe 101, T-sat 44%.  On Niferex 150 mg PO daily (need to separate doses from Levaquin by at least 2 hours).  Goal of Therapy:  Appropriate Levaquin dose for renal function and infection Hgb 11-13  Plan:   Continue Levaquin 250 mg IV q24hrs, up to 4 doses.  2nd dose due 9/1.  Will follow up renal function, clinical status, and length of therapy.   Continue Aranesp 60 mcg SQ weekly on Mondays.   Follow up CBC; weekly retic count.     Per inpatient protocol, hold Aranesp if Hgb >11.  Dennie Fetters, RPh Pager: [phone] 12/05/2012,11:18 AM

## 2012-12-05 NOTE — Care Management Note (Addendum)
    Page 1 of 2   12/07/2012     4:34:40 PM   CARE MANAGEMENT NOTE 12/07/2012  Patient:  [name], [name]   Account Number:  [id number]  Date Initiated:  12/03/2012  Documentation initiated by:  [facility]  Subjective/Objective Assessment:   PNA  admit- lives with family. has 24 hr care at home per patient.     Action/Plan:   pt eval- rec hhpt and rolling walker.   Anticipated DC Date:  12/07/2012   Anticipated DC Plan:  HOME W HOME HEALTH SERVICES      DC Planning Services  CM consult      [facility] Choice  HOME HEALTH   Choice offered to / List presented to:  C-1 Patient        HH arranged  HH-1 RN  HH-2 PT  HH-3 OT      [facility] agency  [facility]   Status of service:  Completed, signed off Medicare Important Message given?   (If response is "NO", the following Medicare IM given date fields will be blank) Date Medicare IM given:   Date Additional Medicare IM given:    Discharge Disposition:  HOME W HOME HEALTH SERVICES  Per UR Regulation:  Reviewed for med. necessity/level of care/duration of stay  If discussed at Long Length of Stay Meetings, dates discussed:    Comments:  12/07/12 8:31 Letha Cape RN,BSN  [phone] patient for dc to home today.  Patient stated he may need ambulance .  Notified [facility] with [facility] [place].  Called [employer] to see why the rolling walker has not been delivered. Awaiting call back.  [facility] will deliver rolling walker to patient's home, Dr. Valentina Lucks faxed a written script to [facility] which they stated they needed in order for [facility] to pay for it.  Patient 's son states patient has a brand new walkre at home so the order for the walker with [facility] has been canceled.   12/05/12 11:42 Letha Cape RN, BSN [phone] patient is active with [facility], per physical therapy patient will need hhpt and he also needs a hhrn per patient.  Order faxed to [employer] for rolling walker. Referral made to [facility] and HHP[facility].  Soc will begin  24-48 hrs post discharge.  12/03/2012 1300 Pt active with [facility]. Isidoro Donning RN CCM Case Mgmt phone [phone]

## 2012-12-05 NOTE — Progress Notes (Signed)
Physical Therapy Treatment Patient Details Name: [name] MRN: [id number] DOB: 08-08-1917 Today's Date: 12/05/2012 Time: 4098-1191 PT Time Calculation (min): 42 min  PT Assessment / Plan / Recommendation  History of Present Illness 79 y.o. male admitted to [facility] on 11/30/12 from home with SOB.  Dx with combination of bronchitis and CHF.     PT Comments   Patient demonstrates some progress towards PT goals. Patient used bed pain prior to session, was able to perform bed mobility for hygiene however, patient did require assistance with hygiene after urinating at conclusion of session. Questionable cognition as patient did not state that he needed to urinate, nor did he report that he had. Once returned to sitting, noticed gown was saturated, floor wet and pad needed to be changed. Encouraged patient to use urinal or ask for assistance next time.  Patient did tolerate ambulation with HR increasing to 120s. Patient returned supine in bed and left with nsg in room. Will continue to see as indicated.  Follow Up Recommendations  Home health PT           Equipment Recommendations  Rolling walker with 5" wheels;Other (comment) (if we confirm with his son that he doesn't have one)    Recommendations for Other Services OT consult  Frequency Min 3X/week   Progress towards PT Goals Progress towards PT goals: Progressing toward goals  Plan Current plan remains appropriate    Precautions / Restrictions Precautions Precautions: Fall Precaution Comments: monitor O2 and DOE.   Restrictions Weight Bearing Restrictions: No   Pertinent Vitals/Pain Patient reports no pain    Mobility  Bed Mobility Bed Mobility: Rolling Right;Rolling Left;Supine to Sit;Sitting - Scoot to [place] of Bed;Sit to Supine Rolling Right: 7: Independent Rolling Left: 7: Independent Supine to Sit: 4: Min assist Sitting - Scoot to [place] of Bed: 4: Min assist;With rail Sit to Supine: 4: Min guard Details for Bed Mobility  Assistance: improved over previous session Transfers Transfers: Sit to Stand;Stand to [employer] Transfers Sit to Stand: 4: Min guard Stand to Sit: 4: Min guard Details for Transfer Assistance: Improved stability with standing, VCs for hand placement Ambulation/Gait Ambulation/Gait Assistance: 4: Min guard Ambulation Distance (Feet): 180 Feet Assistive device: Rolling walker Ambulation/Gait Assistance Details: used 2 liters O2, some DOE 2/4 towards end of ambulation Gait Pattern: Step-through pattern;Decreased stride length;Trunk flexed;Narrow base of support Gait velocity: decreased General Gait Details: some instability noted, no significant LOB, VCs for upright posture     PT Goals (current goals can now be found in the care plan section) Acute Rehab PT Goals Patient Stated Goal: to get stronger, breathe better PT Goal Formulation: With patient Time For Goal Achievement: 12/17/12 Potential to Achieve Goals: Good  Visit Information  Last PT Received On: 12/05/12 Assistance Needed: +1 History of Present Illness: 79 y.o. male admitted to [facility] on 11/30/12 from home with SOB.  Dx with combination of bronchitis and CHF.      Subjective Data  Subjective: Im finished having a bowel movement Patient Stated Goal: to get stronger, breathe better   Cognition  Cognition Arousal/Alertness: Awake/alert Behavior During Therapy: WFL for tasks assessed/performed Overall Cognitive Status: Impaired/Different from baseline Area of Impairment: Orientation;Memory Orientation Level: Time Memory: Decreased short-term memory General Comments: History given is very different from history collected last admission.  questionable historian.  No family available to confirm hx questions, Questionable cognition relating to self care (see assessment)   Balance  Balance Balance Assessed: Yes Static Sitting Balance Static  Sitting - Balance Support: Bilateral upper extremity supported;Feet  supported Static Sitting - Level of Assistance: 7: Independent Static Sitting - Comment/# of Minutes: 6 Static Standing Balance Static Standing - Balance Support: Bilateral upper extremity supported Static Standing - Level of Assistance: 5: Stand by assistance Static Standing - Comment/# of Minutes: 7 minutes standing at counter height Dynamic Standing Balance Dynamic Standing - Balance Support: Bilateral upper extremity supported Dynamic Standing - Level of Assistance: 5: Stand by assistance Dynamic Standing - Comments: performing self hygiene  End of Session PT - End of Session Equipment Utilized During Treatment: Oxygen Activity Tolerance: Patient limited by fatigue (limited by DOE) Patient left: in chair;with call bell/phone within reach;with chair alarm set Nurse Communication: Mobility status   GP     Fabio Asa 12/05/2012, 4:25 PM Charlotte Crumb, PT DPT  [phone]

## 2012-12-06 LAB — CBC
Hemoglobin: 9.5 g/dL — ABNORMAL LOW (ref 13.0–17.0)
MCH: 30.9 pg (ref 26.0–34.0)
MCV: 95.8 fL (ref 78.0–100.0)
Platelets: 220 10*3/uL (ref 150–400)
RBC: 3.07 MIL/uL — ABNORMAL LOW (ref 4.22–5.81)
WBC: 7.1 10*3/uL (ref 4.0–10.5)

## 2012-12-06 LAB — BASIC METABOLIC PANEL
CO2: 33 mEq/L — ABNORMAL HIGH (ref 19–32)
Calcium: 9.2 mg/dL (ref 8.4–10.5)
Chloride: 101 mEq/L (ref 96–112)
Glucose, Bld: 142 mg/dL — ABNORMAL HIGH (ref 70–99)
Potassium: 4.2 mEq/L (ref 3.5–5.1)
Sodium: 140 mEq/L (ref 135–145)

## 2012-12-06 LAB — GLUCOSE, CAPILLARY
Glucose-Capillary: 197 mg/dL — ABNORMAL HIGH (ref 70–99)
Glucose-Capillary: 309 mg/dL — ABNORMAL HIGH (ref 70–99)

## 2012-12-06 MED ORDER — FUROSEMIDE 40 MG PO TABS
40.0000 mg | ORAL_TABLET | Freq: Every day | ORAL | Status: DC
  Administered 2012-12-06 – 2012-12-07 (×2): 40 mg via ORAL
  Filled 2012-12-06 (×2): qty 1

## 2012-12-06 MED ORDER — POTASSIUM CHLORIDE CRYS ER 20 MEQ PO TBCR
20.0000 meq | EXTENDED_RELEASE_TABLET | Freq: Every day | ORAL | Status: DC
Start: 2012-12-06 — End: 2012-12-07
  Administered 2012-12-06 – 2012-12-07 (×2): 20 meq via ORAL
  Filled 2012-12-06: qty 1

## 2012-12-06 MED ORDER — PREDNISONE 20 MG PO TABS
40.0000 mg | ORAL_TABLET | Freq: Every day | ORAL | Status: DC
  Administered 2012-12-06 – 2012-12-07 (×2): 40 mg via ORAL
  Filled 2012-12-06 (×3): qty 2

## 2012-12-06 MED ORDER — LISINOPRIL 20 MG PO TABS
20.0000 mg | ORAL_TABLET | Freq: Every day | ORAL | Status: DC
  Administered 2012-12-06 – 2012-12-07 (×2): 20 mg via ORAL
  Filled 2012-12-06 (×2): qty 1

## 2012-12-06 NOTE — Progress Notes (Signed)
Subjective: Feels better  Objective: Vital signs in last 24 hours: Temp:  [98.3 F (36.8 C)-98.5 F (36.9 C)] 98.5 F (36.9 C) (08/28 0530) Pulse Rate:  [71-89] 73 (08/28 0530) Resp:  [18] 18 (08/28 0530) BP: (162-185)/(74-100) 176/80 mmHg (08/28 0612) SpO2:  [95 %-99 %] 99 % (08/28 0530) Weight:  [102.7 kg (226 lb 6.6 oz)] 102.7 kg (226 lb 6.6 oz) (08/28 0700) Weight change: -1.8 kg (-3 lb 15.5 oz) Last BM Date: 12/03/12  Intake/Output from previous day: 08/27 0701 - 08/28 0700 In: 50 [IV Piggyback:50] Out: -  Intake/Output this shift:    General appearance: alert and cooperative Resp: fewer wheezes Cardio: irregularly irregular rhythm Extremities: extremities normal, atraumatic, no cyanosis or edema  Lab Results:  Recent Labs  12/05/12 0705 12/06/12 0540  WBC 7.9 7.1  HGB 10.0* 9.5*  HCT 30.6* 29.4*  PLT 241 220   BMET  Recent Labs  12/05/12 0705 12/06/12 0540  NA 140 140  K 4.2 4.2  CL 103 101  CO2 31 33*  GLUCOSE 155* 142*  BUN 35* 40*  CREATININE 1.43* 1.54*  CALCIUM 9.1 9.2    Studies/Results: No results found.  Medications: I have reviewed the patient's current medications.  Assessment/Plan:   1.asthmatic bronchitis/pneumonia and CHF. Improved less wheezing - continue Levaquin therapy IV and change lasix to po daily. Decrease prednisone -duonebs q6  - Continue lisinopril, metoprolol  2. AKI, improved likely at baseline 3. Hypokalemia  - Replaced  4. T2DM sugars controlled, on steroids  - continue lantus and SSI 5. Chronic anemia has received 2U PRBC and aranesp, increase iron, likely oupt hematology evaluation.  7.Systolic dysfunction, left ventricle - EF 40-45%  8.Hypertension. Elevated BP increase lisinopril 9.Disposition plan on home with home PT   LOS: 6 days   [name] 12/06/2012, 7:21 AM

## 2012-12-06 NOTE — Progress Notes (Signed)
Physical Therapy Treatment Patient Details Name: [name] MRN: [id number] DOB: Feb 03, 1918 Today's Date: 12/06/2012 Time: 1220-1243 PT Time Calculation (min): 23 min  PT Assessment / Plan / Recommendation  History of Present Illness 79 y.o. male admitted to [facility] on 11/30/12 from home with SOB.  Dx with combination of bronchitis and CHF.     PT Comments   The pt continues to need assist for all OOB mobility to be safe.  DOE even on O2 is 2/4, but O2 sats remain in the low 90s.  Pt continues to be appropriate for [facility] therapy as long as he has assist at home from son.    Follow Up Recommendations  Home health PT;Supervision for mobility/OOB     Does the patient have the potential to tolerate intense rehabilitation    NA  Barriers to Discharge   None      Equipment Recommendations  Rolling walker with 5" wheels;Other (comment) (if we confirm with his son that he doesn't have one)    Recommendations for Other Services OT consult  Frequency Min 3X/week   Progress towards PT Goals Progress towards PT goals: Progressing toward goals  Plan Current plan remains appropriate    Precautions / Restrictions Precautions Precautions: Fall Precaution Comments: monitor O2 and DOE.     Pertinent Vitals/Pain See vitals flow sheet.     Mobility  Bed Mobility Rolling Right: 6: Modified independent (Device/Increase time);With rail Supine to Sit: 4: Min assist;With rails Sitting - Scoot to [place] of Bed: 4: Min assist;With rail Details for Bed Mobility Assistance: min assist to get to sitting to support trunk during transitional movements.  Transfers Transfers: Stand to Sit;Sit to Stand Sit to Stand: 3: Mod assist;4: Min assist;With upper extremity assist;With armrests;From bed;From toilet Stand to Sit: 4: Min assist;With upper extremity assist;With armrests;To chair/3-in-1;To toilet Details for Transfer Assistance: mod assist to support trunk to get to feet from low toilet seat.  Min assist  from higher bed.   Ambulation/Gait Ambulation/Gait Assistance: 4: Min guard Ambulation Distance (Feet): 120 Feet Assistive device: Rolling walker Ambulation/Gait Assistance Details: 2 L O2 [place] used, DOE 2/4 and O2 sats 91% after walking.   Gait Pattern: Step-through pattern;Shuffle;Trunk flexed Gait velocity: less than 1.8 ft/sec indicating risk for recurrent falls General Gait Details: verbal cues for upright posture and safe RW use.       PT Goals (current goals can now be found in the care plan section) Acute Rehab PT Goals Patient Stated Goal: to get stronger, breathe better  Visit Information  Last PT Received On: 12/06/12 Assistance Needed: +1 History of Present Illness: 79 y.o. male admitted to [facility] on 11/30/12 from home with SOB.  Dx with combination of bronchitis and CHF.      Subjective Data  Subjective: Pt on bedpan struggling to have BM.  Agreeable that he might be more successful in the bathroom.   Patient Stated Goal: to get stronger, breathe better   Cognition  Cognition Arousal/Alertness: Awake/alert Behavior During Therapy: WFL for tasks assessed/performed Overall Cognitive Status: Impaired/Different from baseline Area of Impairment: Orientation Orientation Level: Time ("What day of the week is it?")    Balance  Static Sitting Balance Static Sitting - Balance Support: Bilateral upper extremity supported;Feet supported Static Sitting - Level of Assistance: 5: Stand by assistance Static Sitting - Comment/# of Minutes: supervision for safety as pt tends to lean posteriorly.   Static Standing Balance Static Standing - Balance Support: Bilateral upper extremity supported Static Standing -  Level of Assistance: 4: Min assist Static Standing - Comment/# of Minutes: min assist due to slight posterior lean in standing at commode.   Dynamic Standing Balance Dynamic Standing - Balance Support: Bilateral upper extremity supported Dynamic Standing - Level of Assistance: 4:  Min assist Dynamic Standing - Comments: min assist while preforming pericare in bathroom and attempting to walk to bathroom without assistive device.    End of Session PT - End of Session Equipment Utilized During Treatment: Oxygen Activity Tolerance: Patient limited by fatigue Patient left: in chair;with call bell/phone within reach;with chair alarm set    Anajulia Leyendecker B. Simcha Speir, PT, DPT [phone]   12/06/2012, 2:15 PM

## 2012-12-07 LAB — BASIC METABOLIC PANEL
BUN: 39 mg/dL — ABNORMAL HIGH (ref 6–23)
CO2: 34 mEq/L — ABNORMAL HIGH (ref 19–32)
Calcium: 9.2 mg/dL (ref 8.4–10.5)
Chloride: 100 mEq/L (ref 96–112)
Creatinine, Ser: 1.45 mg/dL — ABNORMAL HIGH (ref 0.50–1.35)
Glucose, Bld: 124 mg/dL — ABNORMAL HIGH (ref 70–99)

## 2012-12-07 LAB — CULTURE, BLOOD (ROUTINE X 2): Culture: NO GROWTH

## 2012-12-07 LAB — GLUCOSE, CAPILLARY: Glucose-Capillary: 261 mg/dL — ABNORMAL HIGH (ref 70–99)

## 2012-12-07 MED ORDER — LISINOPRIL 20 MG PO TABS: 20.0000 mg | ORAL_TABLET | Freq: Every day | ORAL | Status: DC

## 2012-12-07 MED ORDER — IPRATROPIUM BROMIDE 0.02 % IN SOLN: 0.5000 mg | Freq: Two times a day (BID) | RESPIRATORY_TRACT | Status: DC

## 2012-12-07 MED ORDER — ALBUTEROL SULFATE HFA 108 (90 BASE) MCG/ACT IN AERS: 2.0000 | INHALATION_SPRAY | Freq: Four times a day (QID) | RESPIRATORY_TRACT | Status: DC | PRN

## 2012-12-07 MED ORDER — LEVOFLOXACIN 250 MG PO TABS
250.0000 mg | ORAL_TABLET | Freq: Every day | ORAL | Status: DC
  Administered 2012-12-07: 250 mg via ORAL
  Filled 2012-12-07: qty 1

## 2012-12-07 MED ORDER — ALBUTEROL SULFATE (5 MG/ML) 0.5% IN NEBU: 2.5000 mg | INHALATION_SOLUTION | Freq: Two times a day (BID) | RESPIRATORY_TRACT | Status: DC

## 2012-12-07 MED ORDER — LEVOFLOXACIN 250 MG PO TABS: 250.0000 mg | ORAL_TABLET | Freq: Every day | ORAL | Status: DC

## 2012-12-07 MED ORDER — PREDNISONE 20 MG PO TABS: 20.0000 mg | ORAL_TABLET | Freq: Every day | ORAL | Status: DC

## 2012-12-07 MED ORDER — GLIPIZIDE ER 5 MG PO TB24: 5.0000 mg | ORAL_TABLET | Freq: Every day | ORAL | Status: DC

## 2012-12-07 NOTE — Progress Notes (Signed)
12/07/12 Patient discharged home today. IV site removed. Discharge instructions given to patient.

## 2012-12-07 NOTE — Progress Notes (Signed)
Patient O2 sat 89% on RA. Oxygen applied at 2L. O2 sat up to 97% on 2L.

## 2012-12-07 NOTE — Discharge Summary (Signed)
Physician Discharge Summary  Patient ID: [name] MRN: [id number] DOB/AGE: 1918-03-04 79 y.o.  Admit date: 11/30/2012 Discharge date: 12/07/2012  Admission Diagnoses: Asthmatic bronchitis Pneumonia Congestive heart failure, systolic dysfunction Chronic kidney disease stage III Hypokalemia Type 2 diabetes Severe anemia, of kidney disease Hypertension Atrial fibrillation, chronic  Discharge Diagnoses:  Principal Problem:   Asthmatic bronchitis Active Problems: Pneumonia   Atrial fibrillation, chronic   Anemia, severe, chronic kidney disease   CHF (congestive heart failure), systolic dysfunction   Type II or unspecified type diabetes mellitus with renal manifestations, not stated as uncontrolled(250.40) Hypokalemia    Discharged Condition: good  Hospital Course: The patient was admitted on August 22 with a one-week history of shortness of breath, cough productive of whitish sputum and some orthopnea and increased swelling in the legs with wheezing. His x-ray did show pulmonary infiltrate and was started on IV antibiotics vancomycin and aztreonam. He also had evidence of congestive heart fire and was given IV diuretics. Urine for strep pneumonia and Legionella negative. His renal function remained at baseline. He continued to have wheezing and some cough. He was switched to IV Levaquin and every 6 nebulizers and prednisone was added. He was given Lasix IV and diuresed well. By discharge he had more clear lungs and was oxygenating with O2 sat 95% room air. His cough had diminished. His kidney function remained stable at baseline. He has severe anemia and received 2 units PRBCs with hemoglobin 9.5 at discharge. He received one dose of Aranesp was continued on iron. Outpatient referral to hematology for continued Aranesp will be pursued. He was seen by physical therapy and home PT arranged. Rolling walker prescription given. He was given sliding scale insulins Lantus for elevated blood  sugars with steroids. Blood pressure was high during the admission and lisinopril was increased to 20 mg a day  CODE STATUS full code Diet low-sodium carb-modified diet  Consults: None  Significant Diagnostic Studies: labs: At discharge sodium 141, potassium 4.3, BUN 39 , creatinine 1.45, hemoglobin 9.5 and radiology: CXR: Chest x-ray on August 22 increased interstitial dose tachycardia capacity. Echocardiogram ejection fraction 40-50%  Treatments: antibiotics: vancomycin and Levaquin, cardiac meds: lisinopril (Prinivil), metoprolol and furosemide, steroids: prednisone and therapies: PT  Discharge Exam: Blood pressure 218/91, pulse 73, temperature 97.6 F (36.4 C), temperature source Oral, resp. rate 18, height 5\' 9"  (1.753 m), weight 101.6 kg (223 lb 15.8 oz), SpO2 99.00%. General appearance: alert and cooperative Resp: Improved wheezing Cardio: irregularly irregular rhythm  Disposition: 06-Home-Health Care Svc   Future Appointments Provider Department Dept Phone   06/07/2013 11:00 AM [facility] [phone]   06/07/2013 12:00 PM Carma Lair Nickel, NP [facility] [phone]       Medication List         albuterol 108 (90 BASE) MCG/ACT inhaler  Commonly known as:  PROVENTIL HFA;VENTOLIN HFA  Inhale 2 puffs into the lungs every 6 (six) hours as needed for wheezing.     aspirin EC 81 MG tablet  Take 81 mg by mouth every morning.     beta carotene w/minerals tablet  Take 1 tablet by mouth every morning.     cilostazol 100 MG tablet  Commonly known as:  PLETAL  Take 100 mg by mouth 2 (two) times daily.     furosemide 40 MG tablet  Commonly known as:  LASIX  Take 40 mg by mouth.     glipiZIDE 5 MG 24 hr  tablet  Commonly known as:  GLUCOTROL XL  Take 1 tablet (5 mg total) by mouth daily with breakfast. Take 2 a day for 4 days then one a day     iron polysaccharides 150 MG capsule  Commonly known  as:  NIFEREX  Take 1 capsule (150 mg total) by mouth daily.     isosorbide mononitrate 30 MG 24 hr tablet  Commonly known as:  IMDUR  Take 30 mg by mouth daily.     levofloxacin 250 MG tablet  Commonly known as:  LEVAQUIN  Take 1 tablet (250 mg total) by mouth daily.     lisinopril 20 MG tablet  Commonly known as:  PRINIVIL,ZESTRIL  Take 1 tablet (20 mg total) by mouth daily.     metoprolol succinate 25 MG 24 hr tablet  Commonly known as:  TOPROL-XL  Take 25 mg by mouth daily after breakfast.     nitroGLYCERIN 0.4 MG SL tablet  Commonly known as:  NITROSTAT  Place 0.4 mg under the tongue every 5 (five) minutes as needed for chest pain.     predniSONE 20 MG tablet  Commonly known as:  DELTASONE  Take 1 tablet (20 mg total) by mouth daily. For 2 days, then 1/2 a day for 2 days, then stop     tamsulosin 0.4 MG Caps capsule  Commonly known as:  FLOMAX  Take 0.4 mg by mouth daily after breakfast.     vitamin B-12 1000 MCG tablet  Commonly known as:  CYANOCOBALAMIN  Take 1,000 mcg by mouth daily.           Follow-up Information   Follow up with Lillia Mountain, MD In 2 weeks.   Specialty:  Internal Medicine   Contact information:   [address], [address] Jaynie Crumble [place] [address] [phone]       Signed: Lillia Mountain 12/07/2012, 7:48 AM

## 2012-12-07 NOTE — Progress Notes (Signed)
Patient taken off 2L of O2 15 minutes ago. O2 stable at 90% on RA.

## 2012-12-10 DIAGNOSIS — I509 Heart failure, unspecified: Secondary | ICD-10-CM

## 2012-12-10 HISTORY — DX: Heart failure, unspecified: I50.9

## 2012-12-21 ENCOUNTER — Inpatient Hospital Stay (HOSPITAL_COMMUNITY)
Admission: EM | Admit: 2012-12-21 | Discharge: 2012-12-26 | DRG: 291 | Disposition: A | Discharge status: Skilled Nursing Facility | Payer: Medicare HMO | Attending: Internal Medicine | Admitting: Internal Medicine

## 2012-12-21 ENCOUNTER — Encounter (HOSPITAL_COMMUNITY): Payer: Self-pay | Admitting: *Deleted

## 2012-12-21 ENCOUNTER — Emergency Department (HOSPITAL_COMMUNITY): Payer: Medicare HMO

## 2012-12-21 DIAGNOSIS — D649 Anemia, unspecified: Secondary | ICD-10-CM | POA: Diagnosis present

## 2012-12-21 DIAGNOSIS — I5043 Acute on chronic combined systolic (congestive) and diastolic (congestive) heart failure: Principal | ICD-10-CM | POA: Diagnosis present

## 2012-12-21 DIAGNOSIS — I4891 Unspecified atrial fibrillation: Secondary | ICD-10-CM

## 2012-12-21 DIAGNOSIS — J189 Pneumonia, unspecified organism: Secondary | ICD-10-CM | POA: Diagnosis present

## 2012-12-21 DIAGNOSIS — N183 Chronic kidney disease, stage 3 unspecified: Secondary | ICD-10-CM | POA: Diagnosis present

## 2012-12-21 DIAGNOSIS — D638 Anemia in other chronic diseases classified elsewhere: Secondary | ICD-10-CM | POA: Diagnosis present

## 2012-12-21 DIAGNOSIS — I129 Hypertensive chronic kidney disease with stage 1 through stage 4 chronic kidney disease, or unspecified chronic kidney disease: Secondary | ICD-10-CM | POA: Diagnosis present

## 2012-12-21 DIAGNOSIS — Z880 Allergy status to penicillin: Secondary | ICD-10-CM

## 2012-12-21 DIAGNOSIS — I509 Heart failure, unspecified: Secondary | ICD-10-CM

## 2012-12-21 DIAGNOSIS — Y95 Nosocomial condition: Secondary | ICD-10-CM | POA: Diagnosis present

## 2012-12-21 DIAGNOSIS — B9689 Other specified bacterial agents as the cause of diseases classified elsewhere: Secondary | ICD-10-CM | POA: Diagnosis present

## 2012-12-21 DIAGNOSIS — N390 Urinary tract infection, site not specified: Secondary | ICD-10-CM | POA: Diagnosis present

## 2012-12-21 DIAGNOSIS — H3530 Unspecified macular degeneration: Secondary | ICD-10-CM | POA: Diagnosis present

## 2012-12-21 DIAGNOSIS — E876 Hypokalemia: Secondary | ICD-10-CM | POA: Diagnosis not present

## 2012-12-21 DIAGNOSIS — I739 Peripheral vascular disease, unspecified: Secondary | ICD-10-CM | POA: Diagnosis present

## 2012-12-21 DIAGNOSIS — I10 Essential (primary) hypertension: Secondary | ICD-10-CM | POA: Diagnosis present

## 2012-12-21 DIAGNOSIS — N189 Chronic kidney disease, unspecified: Secondary | ICD-10-CM | POA: Diagnosis present

## 2012-12-21 DIAGNOSIS — Z79899 Other long term (current) drug therapy: Secondary | ICD-10-CM

## 2012-12-21 DIAGNOSIS — E119 Type 2 diabetes mellitus without complications: Secondary | ICD-10-CM | POA: Diagnosis present

## 2012-12-21 DIAGNOSIS — N289 Disorder of kidney and ureter, unspecified: Secondary | ICD-10-CM

## 2012-12-21 DIAGNOSIS — E785 Hyperlipidemia, unspecified: Secondary | ICD-10-CM | POA: Diagnosis present

## 2012-12-21 DIAGNOSIS — I5031 Acute diastolic (congestive) heart failure: Secondary | ICD-10-CM

## 2012-12-21 LAB — URINALYSIS, ROUTINE W REFLEX MICROSCOPIC
Bilirubin Urine: NEGATIVE
Protein, ur: 100 mg/dL — AB
Urobilinogen, UA: 1 mg/dL (ref 0.0–1.0)

## 2012-12-21 LAB — URINE MICROSCOPIC-ADD ON

## 2012-12-21 LAB — BASIC METABOLIC PANEL
BUN: 14 mg/dL (ref 6–23)
Creatinine, Ser: 1.24 mg/dL (ref 0.50–1.35)
GFR calc Af Amer: 56 mL/min — ABNORMAL LOW (ref >90)
GFR calc non Af Amer: 48 mL/min — ABNORMAL LOW (ref >90)
Potassium: 4 mEq/L (ref 3.5–5.1)

## 2012-12-21 LAB — CBC WITH DIFFERENTIAL/PLATELET
Basophils Absolute: 0 10*3/uL (ref 0.0–0.1)
Basophils Relative: 1 % (ref 0–1)
Eosinophils Absolute: 0.2 10*3/uL (ref 0.0–0.7)
HCT: 29.4 % — ABNORMAL LOW (ref 39.0–52.0)
MCV: 96.7 fL (ref 78.0–100.0)
Monocytes Absolute: 0.4 10*3/uL (ref 0.1–1.0)
Monocytes Relative: 8 % (ref 3–12)
Platelets: 307 10*3/uL (ref 150–400)
RBC: 3.04 MIL/uL — ABNORMAL LOW (ref 4.22–5.81)
WBC: 5.8 10*3/uL (ref 4.0–10.5)

## 2012-12-21 MED ORDER — SODIUM CHLORIDE 0.9 % IV SOLN
INTRAVENOUS | Status: DC
  Administered 2012-12-21: 22:00:00 via INTRAVENOUS

## 2012-12-21 MED ORDER — VANCOMYCIN HCL 10 G IV SOLR
1500.0000 mg | INTRAVENOUS | Status: DC
  Administered 2012-12-22: 22:00:00 1500 mg via INTRAVENOUS
  Filled 2012-12-21 (×2): qty 1500

## 2012-12-21 MED ORDER — OXYCODONE HCL 5 MG PO TABS
5.0000 mg | ORAL_TABLET | ORAL | Status: DC | PRN
  Administered 2012-12-23 (×2): 5 mg via ORAL
  Filled 2012-12-21 (×2): qty 1

## 2012-12-21 MED ORDER — INSULIN ASPART 100 UNIT/ML ~~LOC~~ SOLN
0.0000 [IU] | SUBCUTANEOUS | Status: DC
  Administered 2012-12-22 (×2): 2 [IU] via SUBCUTANEOUS
  Administered 2012-12-22: 1 [IU] via SUBCUTANEOUS
  Administered 2012-12-22: 13:00:00 3 [IU] via SUBCUTANEOUS
  Administered 2012-12-22 – 2012-12-23 (×4): 1 [IU] via SUBCUTANEOUS

## 2012-12-21 MED ORDER — FUROSEMIDE 10 MG/ML IJ SOLN
80.0000 mg | Freq: Two times a day (BID) | INTRAMUSCULAR | Status: DC
  Administered 2012-12-22: 01:00:00 80 mg via INTRAVENOUS
  Filled 2012-12-21 (×3): qty 8

## 2012-12-21 MED ORDER — ENOXAPARIN SODIUM 30 MG/0.3ML ~~LOC~~ SOLN: 30.0000 mg | SUBCUTANEOUS | Status: DC

## 2012-12-21 MED ORDER — VANCOMYCIN HCL 10 G IV SOLR
1500.0000 mg | Freq: Once | INTRAVENOUS | Status: AC
  Administered 2012-12-22: 1500 mg via INTRAVENOUS
  Filled 2012-12-21: qty 1500

## 2012-12-21 MED ORDER — ACETAMINOPHEN 650 MG RE SUPP: 650.0000 mg | Freq: Four times a day (QID) | RECTAL | Status: DC | PRN

## 2012-12-21 MED ORDER — DEXTROSE 5 % IV SOLN
2.0000 g | INTRAVENOUS | Status: AC
  Administered 2012-12-21: 2 g via INTRAVENOUS
  Filled 2012-12-21: qty 2

## 2012-12-21 MED ORDER — SODIUM CHLORIDE 0.9 % IJ SOLN: 3.0000 mL | INTRAMUSCULAR | Status: DC | PRN

## 2012-12-21 MED ORDER — HYDROMORPHONE HCL PF 1 MG/ML IJ SOLN
0.5000 mg | INTRAMUSCULAR | Status: DC | PRN
  Administered 2012-12-22 – 2012-12-23 (×4): 1 mg via INTRAVENOUS
  Filled 2012-12-21 (×4): qty 1

## 2012-12-21 MED ORDER — LEVOFLOXACIN IN D5W 500 MG/100ML IV SOLN: 500.0000 mg | INTRAVENOUS | Status: DC

## 2012-12-21 MED ORDER — SODIUM CHLORIDE 0.9 % IV SOLN: 250.0000 mL | INTRAVENOUS | Status: DC | PRN

## 2012-12-21 MED ORDER — DEXTROSE 5 % IV SOLN
2.0000 g | Freq: Three times a day (TID) | INTRAVENOUS | Status: DC
  Administered 2012-12-22 – 2012-12-24 (×9): 2 g via INTRAVENOUS
  Filled 2012-12-21 (×12): qty 2

## 2012-12-21 MED ORDER — LEVOFLOXACIN IN D5W 750 MG/150ML IV SOLN: 750.0000 mg | Freq: Once | INTRAVENOUS | Status: DC

## 2012-12-21 MED ORDER — ALUM & MAG HYDROXIDE-SIMETH 200-200-20 MG/5ML PO SUSP: 30.0000 mL | Freq: Four times a day (QID) | ORAL | Status: DC | PRN

## 2012-12-21 MED ORDER — ONDANSETRON HCL 4 MG/2ML IJ SOLN: 4.0000 mg | Freq: Four times a day (QID) | INTRAMUSCULAR | Status: DC | PRN

## 2012-12-21 MED ORDER — SODIUM CHLORIDE 0.9 % IJ SOLN: 3.0000 mL | Freq: Two times a day (BID) | INTRAMUSCULAR | Status: DC

## 2012-12-21 MED ORDER — ACETAMINOPHEN 325 MG PO TABS
650.0000 mg | ORAL_TABLET | Freq: Four times a day (QID) | ORAL | Status: DC | PRN
  Administered 2012-12-23 (×2): 650 mg via ORAL
  Filled 2012-12-21 (×2): qty 2

## 2012-12-21 MED ORDER — ONDANSETRON HCL 4 MG PO TABS: 4.0000 mg | ORAL_TABLET | Freq: Four times a day (QID) | ORAL | Status: DC | PRN

## 2012-12-21 MED ORDER — ENOXAPARIN SODIUM 40 MG/0.4ML ~~LOC~~ SOLN
40.0000 mg | SUBCUTANEOUS | Status: DC
  Administered 2012-12-22 – 2012-12-26 (×5): 40 mg via SUBCUTANEOUS
  Filled 2012-12-21 (×6): qty 0.4

## 2012-12-21 NOTE — ED Notes (Signed)
Per ems: pt from home, hx of ongoing PNA, was going to pcp for follow up today, family member had a difficult time getting pt into car so they canceled apt and called EMS. Lower lung fields diminished, upper clear. Hx COPD. Denies Pain, nausea or vomiting. bp 156/74, pulse 94, respirations 22, saO2 96% 2L [place]
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
## 2012-12-21 NOTE — ED Notes (Signed)
Bed: NU27 Expected date:  Expected time:  Means of arrival:  Comments: ems- elderly, hx of PNA
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
## 2012-12-21 NOTE — Progress Notes (Signed)
ANTIBIOTIC CONSULT NOTE - INITIAL  Pharmacy Consult for vancomycin Indication: HCAP  Allergies  Allergen Reactions  . Penicillins Swelling  . Sulfa Antibiotics Other (See Comments)    unknown    Patient Measurements: Height: 5' 8.9" (175 cm) Weight: 223 lb (101.152 kg) (as reported by tech) IBW/kg (Calculated) : 70.47  Vital Signs: Temp: 98.2 F (36.8 C) (09/12 2005) Temp src: Oral (09/12 2005) BP: 180/74 mmHg (09/12 2006) Pulse Rate: 81 (09/12 2006) Intake/Output from previous day:   Intake/Output from this shift:    Labs:  Recent Labs  12/21/12 1626  WBC 5.8  HGB 9.8*  PLT 307  CREATININE 1.24   Estimated Creatinine Clearance: 43.6 ml/min (by C-G formula based on Cr of 1.24). No results found for this basename: VANCOTROUGH, VANCOPEAK, VANCORANDOM, GENTTROUGH, GENTPEAK, GENTRANDOM, TOBRATROUGH, TOBRAPEAK, TOBRARND, AMIKACINPEAK, AMIKACINTROU, AMIKACIN,  in the last 72 hours   Microbiology: Recent Results (from the past 720 hour(s))  CULTURE, BLOOD (ROUTINE X 2)     Status: None   Collection Time    11/30/12  3:34 PM      Result Value Range Status   Specimen Description BLOOD ARM RIGHT   Final   Special Requests BOTTLES DRAWN AEROBIC ONLY 3CC   Final   Culture  Setup Time     Final   Value: 12/01/2012 00:57     Performed at [facility]   Culture     Final   Value: NO GROWTH 5 DAYS     Performed at [facility]   Report Status 12/07/2012 FINAL   Final  CULTURE, BLOOD (ROUTINE X 2)     Status: None   Collection Time    11/30/12  3:40 PM      Result Value Range Status   Specimen Description BLOOD ARM RIGHT   Final   Special Requests BOTTLES DRAWN AEROBIC ONLY 3CC   Final   Culture  Setup Time     Final   Value: 12/01/2012 00:58     Performed at [facility]   Culture     Final   Value: NO GROWTH 5 DAYS     Performed at [facility]   Report Status 12/07/2012 FINAL   Final  URINE CULTURE     Status: None   Collection  Time    11/30/12  7:52 PM      Result Value Range Status   Specimen Description URINE, RANDOM   Final   Special Requests NONE   Final   Culture  Setup Time     Final   Value: 12/01/2012 01:47     Performed at [facility] Count     Final   Value: >=100,000 COLONIES/ML     Performed at [facility]   Culture     Final   Value: Multiple bacterial morphotypes present, none predominant. Suggest appropriate recollection if clinically indicated.     Performed at [facility]   Report Status 12/02/2012 FINAL   Final  CULTURE, EXPECTORATED SPUTUM-ASSESSMENT     Status: None   Collection Time    12/02/12  2:46 AM      Result Value Range Status   Specimen Description SPUTUM   Final   Special Requests NONE   Final   Sputum evaluation     Final   Value: THIS SPECIMEN IS ACCEPTABLE. RESPIRATORY CULTURE REPORT TO FOLLOW.   Report Status 12/02/2012 FINAL   Final  CULTURE, RESPIRATORY (  NON-EXPECTORATED)     Status: None   Collection Time    12/02/12  2:46 AM      Result Value Range Status   Specimen Description SPUTUM   Final   Special Requests NONE   Final   Gram Stain     Final   Value: ABUNDANT WBC PRESENT, PREDOMINANTLY PMN     RARE SQUAMOUS EPITHELIAL CELLS PRESENT     ABUNDANT GRAM POSITIVE COCCI IN PAIRS     RARE GRAM NEGATIVE RODS     RARE GRAM NEGATIVE COCCI   Culture     Final   Value: NORMAL OROPHARYNGEAL FLORA     Performed at [facility]   Report Status 12/04/2012 FINAL   Final  CULTURE, ROUTINE-GENITAL     Status: None   Collection Time    12/02/12  5:32 AM      Result Value Range Status   Specimen Description PENILE DRAINAGE   Final   Special Requests NONE   Final   Culture     Final   Value: NO GROWTH 2 DAYS     Performed at [facility]   Report Status 12/04/2012 FINAL   Final    Medical History: Past Medical History  Diagnosis Date  . Diabetes mellitus   . Hypertension   . Macular degeneration   . Bladder  tumor 1998  . Cancer 2001    bladder  . Atrial fibrillation   . Peripheral vascular disease   . Hyperlipidemia   . Anemia   . TIA (transient ischemic attack)   . Low back pain   . Pneumonia     Medications:  Scheduled:  . enoxaparin (LOVENOX) injection  30 mg Subcutaneous Q24H  . insulin aspart  0-9 Units Subcutaneous Q4H  . sodium chloride  3 mL Intravenous Q12H   Infusions:  . sodium chloride    . vancomycin     Assessment: 79 yo male recently discharged from hospital a few weeks ago for PNA and antibiotic course now presented to ER with increased SOB and congestion to start abx's for HAP per pharmacy dosing. Patient also with CHF/swelling and possibly a UTI with increased confusion  Estimated normalized CrCl of 37 ml/min/1.58m2  Goal of Therapy:  Vancomycin trough level 15-20 mcg/ml  Plan:  1) Due to age of 79 yo, will not give a loading dose of vancomycin.  2) Start vancomycin 1500mg  IV q24 per protocol 3) Azactam 2g IV q8 for CrCl > 30 ml/min 4) Monitor renal function   Hessie Knows, PharmD, BCPS Pager [phone] 12/21/2012 9:28 PM

## 2012-12-21 NOTE — ED Notes (Signed)
Report attempted,  Bed changed per Lupita Leash to [place] side,  They will call for report

## 2012-12-22 ENCOUNTER — Encounter (HOSPITAL_COMMUNITY): Payer: Self-pay

## 2012-12-22 DIAGNOSIS — E876 Hypokalemia: Secondary | ICD-10-CM | POA: Diagnosis not present

## 2012-12-22 LAB — BASIC METABOLIC PANEL
Calcium: 8.8 mg/dL (ref 8.4–10.5)
GFR calc non Af Amer: 46 mL/min — ABNORMAL LOW (ref >90)
Glucose, Bld: 138 mg/dL — ABNORMAL HIGH (ref 70–99)
Potassium: 2.8 mEq/L — ABNORMAL LOW (ref 3.5–5.1)
Sodium: 139 mEq/L (ref 135–145)

## 2012-12-22 LAB — CBC
Hemoglobin: 9.5 g/dL — ABNORMAL LOW (ref 13.0–17.0)
MCHC: 33.5 g/dL (ref 30.0–36.0)
Platelets: 323 10*3/uL (ref 150–400)
RDW: 19 % — ABNORMAL HIGH (ref 11.5–15.5)
WBC: 5.6 10*3/uL (ref 4.0–10.5)

## 2012-12-22 LAB — HEMOGLOBIN A1C
Hgb A1c MFr Bld: 6.8 % — ABNORMAL HIGH (ref <5.7)
Mean Plasma Glucose: 148 mg/dL — ABNORMAL HIGH (ref <117)

## 2012-12-22 LAB — GLUCOSE, CAPILLARY
Glucose-Capillary: 144 mg/dL — ABNORMAL HIGH (ref 70–99)
Glucose-Capillary: 122 mg/dL — ABNORMAL HIGH (ref 70–99)
Glucose-Capillary: 160 mg/dL — ABNORMAL HIGH (ref 70–99)

## 2012-12-22 MED ORDER — POTASSIUM CHLORIDE CRYS ER 20 MEQ PO TBCR
40.0000 meq | EXTENDED_RELEASE_TABLET | Freq: Two times a day (BID) | ORAL | Status: DC
  Administered 2012-12-22 – 2012-12-23 (×4): 40 meq via ORAL
  Filled 2012-12-22 (×6): qty 2

## 2012-12-22 MED ORDER — ALBUTEROL SULFATE (5 MG/ML) 0.5% IN NEBU: 2.5000 mg | INHALATION_SOLUTION | RESPIRATORY_TRACT | Status: DC | PRN

## 2012-12-22 MED ORDER — FUROSEMIDE 10 MG/ML IJ SOLN
40.0000 mg | Freq: Every day | INTRAMUSCULAR | Status: DC
  Administered 2012-12-22 – 2012-12-24 (×3): 40 mg via INTRAVENOUS
  Filled 2012-12-22 (×3): qty 4

## 2012-12-22 MED ORDER — ALBUTEROL SULFATE (5 MG/ML) 0.5% IN NEBU
2.5000 mg | INHALATION_SOLUTION | Freq: Three times a day (TID) | RESPIRATORY_TRACT | Status: DC
  Administered 2012-12-22 – 2012-12-24 (×6): 2.5 mg via RESPIRATORY_TRACT
  Filled 2012-12-22 (×7): qty 0.5

## 2012-12-22 MED ORDER — ALBUTEROL SULFATE (5 MG/ML) 0.5% IN NEBU
2.5000 mg | INHALATION_SOLUTION | Freq: Four times a day (QID) | RESPIRATORY_TRACT | Status: DC
  Administered 2012-12-22: 2.5 mg via RESPIRATORY_TRACT
  Filled 2012-12-22: qty 0.5

## 2012-12-22 MED ORDER — INFLUENZA VAC SPLIT QUAD 0.5 ML IM SUSP
0.5000 mL | Freq: Once | INTRAMUSCULAR | Status: AC
  Administered 2012-12-22: 10:00:00 0.5 mL via INTRAMUSCULAR
  Filled 2012-12-22: qty 0.5

## 2012-12-22 NOTE — Progress Notes (Signed)
Assessment/Plan: Principal Problem:   HAP (hospital-acquired pneumonia) - I don't think this is really the main issue. I will continue abx today and then recheck CXR. If infiltrate is no worse, we will stop abx. His WBC is not elevated and he is not coughing.  Active Problems:   Heart failure, acute on chronic, systolic and diastolic - I think this is the biggest issue. He is better after IV Lasix. He was started on 80 mg bid  IV. I will change to 40 mg IV qd for today. Recheck K etc in AM   T2DM (type 2 diabetes mellitus)   Hypertension   Anemia   Acute on chronic renal insufficiency   UTI (lower urinary tract infection) - few WBCs. Await urine culture   Hypokalemia - due to current Lasix dosing. KCl ordered.    Subjective: Breathing a lot better. States that he had been taking his meds and he slowly got worse at home.   Objective:  Vital Signs: Filed Vitals:   12/21/12 2339 12/22/12 0200 12/22/12 0618 12/22/12 0628  BP: 130/99 166/51  144/72  Pulse: 83 82  88  Temp: 98.8 F (37.1 C) 98.6 F (37 C)  98.8 F (37.1 C)  TempSrc: Oral Oral  Oral  Resp: 19 19  18   Height: 5\' 8"  (1.727 m)     Weight: 100.1 kg (220 lb 10.9 oz)  99.1 kg (218 lb 7.6 oz)   SpO2: 98% 100%  95%     EXAM: comfortable. Lungs clear   Intake/Output Summary (Last 24 hours) at 12/22/12 0847 Last data filed at 12/22/12 0700  Gross per 24 hour  Intake    650 ml  Output   1700 ml  Net  -1050 ml    Lab Results:  Recent Labs  12/21/12 1626 12/22/12 0600  NA 140 139  K 4.0 2.8*  CL 107 103  CO2 25 27  GLUCOSE 144* 138*  BUN 14 14  CREATININE 1.24 1.30  CALCIUM 8.6 8.8   No results found for this basename: AST, ALT, ALKPHOS, BILITOT, PROT, ALBUMIN,  in the last 72 hours No results found for this basename: LIPASE, AMYLASE,  in the last 72 hours  Recent Labs  12/21/12 1626 12/22/12 0600  WBC 5.8 5.6  NEUTROABS 4.2  --   HGB 9.8* 9.5*  HCT 29.4* 28.4*  MCV 96.7 96.3  PLT 307 323   No  results found for this basename: CKTOTAL, CKMB, CKMBINDEX, TROPONINI,  in the last 72 hours No components found with this basename: POCBNP,  No results found for this basename: DDIMER,  in the last 72 hours No results found for this basename: HGBA1C,  in the last 72 hours No results found for this basename: CHOL, HDL, LDLCALC, TRIG, CHOLHDL, LDLDIRECT,  in the last 72 hours No results found for this basename: TSH, T4TOTAL, FREET3, T3FREE, THYROIDAB,  in the last 72 hours No results found for this basename: VITAMINB12, FOLATE, FERRITIN, TIBC, IRON, RETICCTPCT,  in the last 72 hours  Studies/Results: Dg Chest 2 View  12/21/2012   *RADIOLOGY REPORT*  Clinical Data: Dyspnea.  Cough.  CHEST - 2 VIEW  Comparison: 12/03/2012.  Findings: Right upper lobe infiltrate may be superimposed on pulmonary edema and chronic lung changes.  Pleural effusions best seen on lateral view.  Cardiomegaly.  Tortuous aorta.  Right shoulder joint degenerative changes.  IMPRESSION: Pulmonary edema superimposed upon chronic changes.  Additionally, patchiness along the peripheral aspect of the right upper lobe. Infectious  infiltrate at this level not excluded.  Cardiomegaly.  Calcified tortuous aorta.   Original Report Authenticated By: Lacy Duverney, M.D.   Medications: Medications administered in the last 24 hours reviewed.  Current Medication List reviewed.    LOS: 1 day   [facility] Internal [facility] @ [facility] [phone]) 12/22/2012, 8:47 AM

## 2012-12-23 ENCOUNTER — Inpatient Hospital Stay (HOSPITAL_COMMUNITY): Payer: Medicare HMO

## 2012-12-23 LAB — BASIC METABOLIC PANEL
BUN: 18 mg/dL (ref 6–23)
CO2: 25 mEq/L (ref 19–32)
Calcium: 8.5 mg/dL (ref 8.4–10.5)
Chloride: 103 mEq/L (ref 96–112)
Creatinine, Ser: 1.35 mg/dL (ref 0.50–1.35)
GFR calc Af Amer: 50 mL/min — ABNORMAL LOW (ref >90)
GFR calc non Af Amer: 44 mL/min — ABNORMAL LOW (ref >90)
Glucose, Bld: 133 mg/dL — ABNORMAL HIGH (ref 70–99)
Potassium: 4 mEq/L (ref 3.5–5.1)
Sodium: 137 mEq/L (ref 135–145)

## 2012-12-23 LAB — CBC
HCT: 27.2 % — ABNORMAL LOW (ref 39.0–52.0)
Hemoglobin: 9 g/dL — ABNORMAL LOW (ref 13.0–17.0)
MCH: 31.6 pg (ref 26.0–34.0)
MCHC: 33.1 g/dL (ref 30.0–36.0)
MCV: 95.4 fL (ref 78.0–100.0)
Platelets: 305 10*3/uL (ref 150–400)
RBC: 2.85 MIL/uL — ABNORMAL LOW (ref 4.22–5.81)
RDW: 19 % — ABNORMAL HIGH (ref 11.5–15.5)
WBC: 6 10*3/uL (ref 4.0–10.5)

## 2012-12-23 LAB — GLUCOSE, CAPILLARY: Glucose-Capillary: 129 mg/dL — ABNORMAL HIGH (ref 70–99)

## 2012-12-23 MED ORDER — INSULIN ASPART 100 UNIT/ML ~~LOC~~ SOLN
0.0000 [IU] | Freq: Three times a day (TID) | SUBCUTANEOUS | Status: DC
  Administered 2012-12-23 – 2012-12-24 (×3): 2 [IU] via SUBCUTANEOUS
  Administered 2012-12-24: 1 [IU] via SUBCUTANEOUS
  Administered 2012-12-24: 2 [IU] via SUBCUTANEOUS
  Administered 2012-12-25 – 2012-12-26 (×3): 1 [IU] via SUBCUTANEOUS

## 2012-12-23 MED ORDER — VANCOMYCIN HCL 10 G IV SOLR
1250.0000 mg | INTRAVENOUS | Status: DC
  Administered 2012-12-23 – 2012-12-24 (×2): 1250 mg via INTRAVENOUS
  Filled 2012-12-23 (×2): qty 1250

## 2012-12-23 NOTE — Progress Notes (Signed)
ANTIBIOTIC CONSULT NOTE  Pharmacy Consult for vancomycin Indication: HCAP  Allergies  Allergen Reactions  . Penicillins Swelling  . Sulfa Antibiotics Other (See Comments)    unknown    Patient Measurements: Height: 5\' 8"  (172.7 cm) Weight: 219 lb 5.7 oz (99.5 kg) IBW/kg (Calculated) : 68.4  Vital Signs: Temp: 98.5 F (36.9 C) (09/14 1327) Temp src: Oral (09/14 1327) BP: 157/83 mmHg (09/14 1327) Pulse Rate: 89 (09/14 1327) Intake/Output from previous day: 09/13 0701 - 09/14 0700 In: 1354.7 [P.O.:300; I.V.:454.7; IV Piggyback:600] Out: 1300 [Urine:1300]  Labs:  Recent Labs  12/21/12 1626 12/22/12 0600 12/23/12 0510  WBC 5.8 5.6 6.0  HGB 9.8* 9.5* 9.0*  PLT 307 323 305  CREATININE 1.24 1.30 1.35   Estimated Creatinine Clearance: 39.1 ml/min (by C-G formula based on Cr of 1.35).   Microbiology: 9/12 blood x2: NGTD 9/12 urine: >100,000 GNR - pending identification  Anti-infectives:   9/12 >> vancomycin >> 9/12 >> aztreonam >>   Assessment: 79 yo male recently discharged from hospital a few weeks ago for PNA and antibiotic course now presented to ER with increased SOB and congestion to start abx's for HAP per pharmacy dosing. Patient also with CHF/swelling and possibly a UTI with increased confusion  Day #2 Vancomycin and Aztreonam  Urine culture with GNR - f/u identification  SCr stable, CrCl ~ 39 ml/min  Weight, decreased from admission; Currently 99.5kg.  Goal of Therapy:  Vancomycin trough level 15-20 mcg/ml  Plan:   Continue Azactam 2g IV q8  Decrease to Vancomycin 1250mg  IV q24 per protocol  Measure Vanc trough at steady state.  Follow up renal fxn and culture results.   Lynann Beaver PharmD, BCPS Pager [phone] 12/23/2012 1:44 PM

## 2012-12-23 NOTE — Progress Notes (Signed)
Unable to keep a condom cath on - therefore Intake and output is not exact

## 2012-12-23 NOTE — Progress Notes (Signed)
Had 7 beats of V - tach then returns to A-fib 89.  This is not the first time pt has had greater than 3 wide beats.  He is asymptomatic, vss.  Will continue to monitor

## 2012-12-23 NOTE — Progress Notes (Signed)
Assessment/Plan: Principal Problem:   HAP (hospital-acquired pneumonia) - yesterday I was skeptical that he had HAP. However, he did have some fever over the day. Will continue IV abx for now.  Active Problems:   Heart failure, acute on chronic, systolic and diastolic - this seems to be stable. Check CXR later today.    T2DM (type 2 diabetes mellitus)   Hypertension   Anemia   Acute on chronic renal insufficiency   UTI (lower urinary tract infection)   Hypokalemia   Subjective: Feels pretty good today. Felt rotten yesterday when his temp went up. No real cough. Breathing is fine.   Objective:  Vital Signs: Filed Vitals:   12/22/12 1658 12/22/12 2100 12/22/12 2149 12/23/12 0635  BP: 155/57  177/93 135/85  Pulse: 84  84 87  Temp:   100.2 F (37.9 C) 99.5 F (37.5 C)  TempSrc:   Oral Oral  Resp:   20 18  Height:      Weight:    99.5 kg (219 lb 5.7 oz)  SpO2:  96% 97% 98%     EXAM: Lungs: clear.    Intake/Output Summary (Last 24 hours) at 12/23/12 0806 Last data filed at 12/23/12 0636  Gross per 24 hour  Intake 1354.67 ml  Output   1300 ml  Net  54.67 ml    Lab Results:  Recent Labs  12/22/12 0600 12/23/12 0510  NA 139 137  K 2.8* 4.0  CL 103 103  CO2 27 25  GLUCOSE 138* 133*  BUN 14 18  CREATININE 1.30 1.35  CALCIUM 8.8 8.5   No results found for this basename: AST, ALT, ALKPHOS, BILITOT, PROT, ALBUMIN,  in the last 72 hours No results found for this basename: LIPASE, AMYLASE,  in the last 72 hours  Recent Labs  12/21/12 1626 12/22/12 0600 12/23/12 0510  WBC 5.8 5.6 6.0  NEUTROABS 4.2  --   --   HGB 9.8* 9.5* 9.0*  HCT 29.4* 28.4* 27.2*  MCV 96.7 96.3 95.4  PLT 307 323 305   No results found for this basename: CKTOTAL, CKMB, CKMBINDEX, TROPONINI,  in the last 72 hours No components found with this basename: POCBNP,  No results found for this basename: DDIMER,  in the last 72 hours  Recent Labs  12/22/12 0600  HGBA1C 6.8*   No results  found for this basename: CHOL, HDL, LDLCALC, TRIG, CHOLHDL, LDLDIRECT,  in the last 72 hours No results found for this basename: TSH, T4TOTAL, FREET3, T3FREE, THYROIDAB,  in the last 72 hours No results found for this basename: VITAMINB12, FOLATE, FERRITIN, TIBC, IRON, RETICCTPCT,  in the last 72 hours  Studies/Results: Dg Chest 2 View  12/21/2012   *RADIOLOGY REPORT*  Clinical Data: Dyspnea.  Cough.  CHEST - 2 VIEW  Comparison: 12/03/2012.  Findings: Right upper lobe infiltrate may be superimposed on pulmonary edema and chronic lung changes.  Pleural effusions best seen on lateral view.  Cardiomegaly.  Tortuous aorta.  Right shoulder joint degenerative changes.  IMPRESSION: Pulmonary edema superimposed upon chronic changes.  Additionally, patchiness along the peripheral aspect of the right upper lobe. Infectious infiltrate at this level not excluded.  Cardiomegaly.  Calcified tortuous aorta.   Original Report Authenticated By: Lacy Duverney, M.D.   Medications: Medications administered in the last 24 hours reviewed.  Current Medication List reviewed.    LOS: 2 days   [facility] Internal Medicine @ [facility] [phone]) 12/23/2012, 8:06 AM

## 2012-12-24 LAB — GLUCOSE, CAPILLARY
Glucose-Capillary: 189 mg/dL — ABNORMAL HIGH (ref 70–99)
Glucose-Capillary: 147 mg/dL — ABNORMAL HIGH (ref 70–99)

## 2012-12-24 LAB — BASIC METABOLIC PANEL
BUN: 22 mg/dL (ref 6–23)
CO2: 25 mEq/L (ref 19–32)
Calcium: 8.5 mg/dL (ref 8.4–10.5)
Chloride: 101 mEq/L (ref 96–112)
Creatinine, Ser: 1.34 mg/dL (ref 0.50–1.35)
GFR calc Af Amer: 51 mL/min — ABNORMAL LOW (ref >90)
GFR calc non Af Amer: 44 mL/min — ABNORMAL LOW (ref >90)
Glucose, Bld: 131 mg/dL — ABNORMAL HIGH (ref 70–99)
Potassium: 4.4 mEq/L (ref 3.5–5.1)
Sodium: 136 mEq/L (ref 135–145)

## 2012-12-24 MED ORDER — POTASSIUM CHLORIDE CRYS ER 20 MEQ PO TBCR
20.0000 meq | EXTENDED_RELEASE_TABLET | Freq: Every day | ORAL | Status: DC
  Administered 2012-12-24 – 2012-12-26 (×3): 20 meq via ORAL
  Filled 2012-12-24 (×3): qty 1

## 2012-12-24 MED ORDER — ISOSORBIDE MONONITRATE ER 30 MG PO TB24
30.0000 mg | ORAL_TABLET | Freq: Every day | ORAL | Status: DC
  Administered 2012-12-24 – 2012-12-26 (×3): 30 mg via ORAL
  Filled 2012-12-24 (×3): qty 1

## 2012-12-24 MED ORDER — LISINOPRIL 10 MG PO TABS
10.0000 mg | ORAL_TABLET | Freq: Every day | ORAL | Status: DC
  Administered 2012-12-24 – 2012-12-26 (×3): 10 mg via ORAL
  Filled 2012-12-24 (×3): qty 1

## 2012-12-24 MED ORDER — METOPROLOL SUCCINATE ER 25 MG PO TB24
25.0000 mg | ORAL_TABLET | Freq: Every day | ORAL | Status: DC
  Administered 2012-12-24 – 2012-12-26 (×3): 25 mg via ORAL
  Filled 2012-12-24 (×3): qty 1

## 2012-12-24 MED ORDER — TAMSULOSIN HCL 0.4 MG PO CAPS
0.4000 mg | ORAL_CAPSULE | Freq: Every day | ORAL | Status: DC
  Administered 2012-12-24 – 2012-12-25 (×2): 0.4 mg via ORAL
  Filled 2012-12-24 (×3): qty 1

## 2012-12-24 NOTE — Progress Notes (Signed)
Subjective: No complaints  Objective: Vital signs in last 24 hours: Temp:  [98.5 F (36.9 C)-99.5 F (37.5 C)] 98.9 F (37.2 C) (09/14 2201) Pulse Rate:  [79-98] 79 (09/15 0235) Resp:  [16-20] 16 (09/14 2201) BP: (135-181)/(65-85) 172/65 mmHg (09/15 0235) SpO2:  [94 %-98 %] 97 % (09/14 2201) Weight:  [99.5 kg (219 lb 5.7 oz)] 99.5 kg (219 lb 5.7 oz) (09/14 0635) Weight change:  Last BM Date: 12/22/12  Intake/Output from previous day: 09/14 0701 - 09/15 0700 In: 1200 [P.O.:320; I.V.:480; IV Piggyback:400] Out: -  Intake/Output this shift: Total I/O In: 780 [I.V.:480; IV Piggyback:300] Out: -   General appearance: alert Resp: clear to auscultation bilaterally Cardio: regular rate and rhythm, S1, S2 normal, no murmur, click, rub or gallop Extremities: extremities normal, atraumatic, no cyanosis or edema  Lab Results:  Recent Labs  12/22/12 0600 12/23/12 0510  WBC 5.6 6.0  HGB 9.5* 9.0*  HCT 28.4* 27.2*  PLT 323 305   BMET  Recent Labs  12/22/12 0600 12/23/12 0510  NA 139 137  K 2.8* 4.0  CL 103 103  CO2 27 25  GLUCOSE 138* 133*  BUN 14 18  CREATININE 1.30 1.35  CALCIUM 8.8 8.5    Studies/Results: Dg Chest 2 View  12/23/2012   CLINICAL DATA:  79 year old male with productive cough and weakness.  EXAM: CHEST  2 VIEW  COMPARISON:  12/21/2012 and earlier.  FINDINGS: Semi upright AP and lateral views of the chest. Stable cardiomegaly and mediastinal contours. Interval decreased widespread interstitial opacity. Decreased more confluent right upper lobe opacity. At the same time patchy opacity at the lung bases is stable. No pneumothorax. Small pleural effusions are less evident. There remains fluid in the pleural fissures.  IMPRESSION: Interval decreased opacity, favor regression of pulmonary edema, with improving infection a less likely consideration. Small pleural effusions also appear decreased. No areas of worsening ventilation.   Electronically Signed   By: Augusto Gamble M.D.   On: 12/23/2012 10:47    Medications: I have reviewed the patient's current medications.  Assessment/Plan: Principal Problem:  HAP (hospital-acquired pneumonia) - has had low grade fever, continue IV antibiotics one more day Active Problems:  Heart failure, acute on chronic, systolic and diastolic - CXR improved, continue IV lasix and restart ACEI and metoprolol and nitrate T2DM (type 2 diabetes mellitus) reasonable control on SSI Hypertension BP up restarting meds as above Anemia stable UTI (lower urinary tract infection)  On aztreonam, culture pending Hypokalemia resolved    LOS: 3 days   [name] 12/24/2012, 5:20 AM

## 2012-12-24 NOTE — Progress Notes (Addendum)
Patient's daughter has accepted bed offer @ [facility]. Anticipating possible discharge Tuesday. CSW will follow-up in the morning.   Dr. Valentina Lucks - please sign FL2 on shadow chart in [facility].    Clinical Social Work Department CLINICAL SOCIAL WORK PLACEMENT NOTE 12/24/2012  Patient:  [name], [name]  Account Number:  [id number] Admit date:  12/21/2012  Clinical Social Worker:  Orpah Greek  Date/time:  12/24/2012 12:58 PM  Clinical Social Work is seeking post-discharge placement for this patient at the following level of care:   SKILLED NURSING   (*CSW will update this form in Epic as items are completed)   12/24/2012  Patient/family provided with [facility] Department of Clinical Social Work's list of facilities offering this level of care within the geographic area requested by the patient (or if unable, by the patient's family).  12/24/2012  Patient/family informed of their freedom to choose among providers that offer the needed level of care, that participate in Medicare, Medicaid or managed care program needed by the patient, have an available bed and are willing to accept the patient.  12/24/2012  Patient/family informed of [facility]' ownership interest in [facility], as well as of the fact that they are under no obligation to receive care at this facility.  PASARR submitted to EDS on 12/24/2012 PASARR number received from EDS on 12/24/2012  FL2 transmitted to all facilities in geographic area requested by pt/family on  12/24/2012 FL2 transmitted to all facilities within larger geographic area on   Patient informed that his/her managed care company has contracts with or will negotiate with  certain facilities, including the following:     Patient/family informed of bed offers received:  12/24/12 Patient chooses bed at [facility] Physician recommends and patient chooses bed at    Patient to be transferred to  on   Patient to be transferred to  facility by   The following physician request were entered in Epic:   Additional Comments:   Unice Bailey, LCSW [facility] Clinical Social Worker cell #: [phone]

## 2012-12-24 NOTE — Progress Notes (Signed)
Inpatient Diabetes Program Recommendations  AACE/ADA: New Consensus Statement on Inpatient Glycemic Control (2013)  Target Ranges:  Prepandial:   less than 140 mg/dL      Peak postprandial:   less than 180 mg/dL (1-2 hours)      Critically ill patients:  140 - 180 mg/dL   Reason for Visit: Hyperglycemia Results for [name], [name] (MRN [id number]) as of 12/24/2012 12:52  Ref. Range 12/23/2012 11:51 12/23/2012 16:22 12/23/2012 21:59 12/24/2012 07:45 12/24/2012 11:57  Glucose-Capillary Latest Range: 70-99 mg/dL 409 (H) 811 (H) 914 (H) 122 (H) 189 (H)     Inpatient Diabetes Program Recommendations Correction (SSI): Increase to moderate tidwc and hs HgbA1C: 6.8% - good control at home Diet: Add CHO mod med to heart healthy diet  Note: Will continue to follow while inpatient.  Thank you. Ailene Ards, RD, LDN, CDE Inpatient Diabetes Coordinator [phone]

## 2012-12-24 NOTE — Progress Notes (Signed)
Clinical Social Work Department BRIEF PSYCHOSOCIAL ASSESSMENT 12/24/2012  Patient:  [name], [name]     Account Number:  [id number]     Admit date:  12/21/2012  Clinical Social Worker:  Orpah Greek  Date/Time:  12/24/2012 12:52 PM  Referred by:  Physician  Date Referred:  12/24/2012 Referred for  SNF Placement   Other Referral:   Interview type:  Family Other interview type:   [name] (daughter)    PSYCHOSOCIAL DATA Living Status:  WITH ADULT CHILDREN Admitted from facility:   Level of care:   Primary support name:  [name] (daughter) ph#: [phone] Primary support relationship to patient:  CHILD, ADULT Degree of support available:   good    CURRENT CONCERNS Current Concerns  Post-Acute Placement   Other Concerns:    SOCIAL WORK ASSESSMENT / PLAN CSW received consult for SNF placement, note PT evaluation recommended SNF for patient at discharge due to 2+ assist. Patient was recently at [facility] 8/22-8/29/14 and [facility]. Patient has a caregiver sit with him throughout the day (8:30a-8pm) while patient's son works.   Assessment/plan status:  Information/Referral to [facility] Other assessment/ plan:   Information/referral to community resources:   CSW completed FL2 and faxed information out to [facility] - will provide bed offers when available.    PATIENT'S/FAMILY'S RESPONSE TO PLAN OF CARE: Patient's sister states that he has been to [facility] in the past and would prefer that he go back there if SNF is still needed at discharge.       Unice Bailey, LCSW [facility] Clinical Social Worker cell #: [phone]

## 2012-12-24 NOTE — Evaluation (Signed)
Physical Therapy Evaluation Patient Details Name: [name] MRN: [id number] DOB: 10-03-1917 Today's Date: 12/24/2012 Time: 8295-6213 PT Time Calculation (min): 22 min  PT Assessment / Plan / Recommendation History of Present Illness  79 y.o. male admitted with PNA and UTI. Recent hospitalization 2 weeks ago for bronchitits and CHF.   Clinical Impression  *Bed to recliner transfer with +2 total assist (pt 30%). This represents significant decline in mobility compared to recent PT note from hospitalization 2 weeks ago. Pt will need 24* assist at present functional level. Need to confirm family can provide this as pt not consistent historian (told me he lives with his parents). He would benefit from acute PT to maximize safety and independence with mobility. **    PT Assessment  Patient needs continued PT services    Follow Up Recommendations  Home health PT;Supervision/Assistance - 24 hour (SNF if family unable to provide 24* assist)    Does the patient have the potential to tolerate intense rehabilitation      Barriers to Discharge   at present pt requires 24* total assist, need to confirm family ca provide this    Equipment Recommendations  None recommended by PT (if we confirm with his son that he doesn't have one)    Recommendations for Other Services OT consult   Frequency Min 3X/week    Precautions / Restrictions Precautions Precautions: Fall Precaution Comments: monitor O2 and DOE.   Restrictions Weight Bearing Restrictions: No   Pertinent Vitals/Pain *SaO2 94% on RA with activity, 98% at rest HR 103 with activity, 98 at rest Pt c/o pain R great toe, 2nd and 3rd toe with mobility, RN notified**      Mobility  Bed Mobility Supine to Sit: 1: +2 Total assist;HOB elevated Supine to Sit: Patient Percentage: 30% Sitting - Scoot to Edge of Bed: 1: +2 Total assist Sitting - Scoot to Edge of Bed: Patient Percentage: 10% Details for Bed Mobility Assistance: assist to  intiate movement, and to elevate trunk and advance BLEs to EOB Transfers Transfers: Stand to Sit;Sit to Stand Sit to Stand: From bed;1: +2 Total assist Sit to Stand: Patient Percentage: 30% Stand to Sit: To chair/3-in-1;1: +2 Total assist Stand to Sit: Patient Percentage: 30% Stand Pivot Transfers: 1: +2 Total assist Stand Pivot Transfers: Patient Percentage: 30% Details for Transfer Assistance: assist to rise, able to WB thru BLEs with RW, assist to initiate pivot, increased time for all movement Ambulation/Gait Ambulation/Gait Assistance: Not tested (comment)    Exercises     PT Diagnosis: Difficulty walking;Generalized weakness  PT Problem List: Decreased strength;Decreased activity tolerance;Decreased mobility;Decreased balance;Decreased cognition;Decreased safety awareness;Decreased knowledge of use of DME PT Treatment Interventions: DME instruction;Gait training;Stair training;Functional mobility training;Therapeutic activities;Therapeutic exercise;Balance training;Neuromuscular re-education;Cognitive remediation;Patient/family education     PT Goals(Current goals can be found in the care plan section) Acute Rehab PT Goals Patient Stated Goal: none stated PT Goal Formulation: With patient Time For Goal Achievement: 01/07/13 Potential to Achieve Goals: Fair  Visit Information  Last PT Received On: 12/24/12 Assistance Needed: +2 History of Present Illness: 79 y.o. male admitted with PNA and UTI. Recent hospitalization 2 weeks ago for bronchitits and CHF.        Prior Functioning  Home Living Family/patient expects to be discharged to:: Private residence Living Arrangements: Children (reports his son lives with him) Available Help at Discharge: Family;Available 24 hours/day (per pt his son could take some time off) Type of Home: House Home Access: Stairs to enter [employer]  of Steps: 7 Entrance Stairs-Rails: Right;Left;Can reach both Home Layout: One  level Home Equipment: Walker - 2 wheels Additional Comments: The above info is from prior admission 3 weeks ago. Unsure of accuracy as pt intially stated to me that he lives with his parents, then stated he lives with his children. Prior Function Level of Independence: Needs assistance Comments: pt stated he used RW at home Communication Communication: HOH (increased timei to respond to questions)    Cognition  Cognition Arousal/Alertness: Awake/alert Behavior During Therapy: WFL for tasks assessed/performed Overall Cognitive Status: Impaired/Different from baseline Area of Impairment: Memory Orientation Level: Time;Disoriented to ("What day of the week is it?") Memory: Decreased short-term memory General Comments: .  questionable historian.  No family available to confirm hx questions    Extremity/Trunk Assessment Upper Extremity Assessment Upper Extremity Assessment: Generalized weakness Lower Extremity Assessment Lower Extremity Assessment: Generalized weakness (B knee extension AROM -15*, knee ext -4/5 strength) Cervical / Trunk Assessment Cervical / Trunk Assessment: Kyphotic   Balance Static Sitting Balance Static Sitting - Balance Support: Bilateral upper extremity supported;Feet supported Static Sitting - Level of Assistance: 5: Stand by assistance Static Sitting - Comment/# of Minutes: 4 minutes Static Standing Balance Static Standing - Balance Support: Bilateral upper extremity supported Static Standing - Level of Assistance: 4: Min assist Static Standing - Comment/# of Minutes: 3 minutes  End of Session PT - End of Session Activity Tolerance: Patient limited by fatigue Patient left: in chair;with call bell/phone within reach;with chair alarm set Nurse Communication: Mobility status  GP     Tamala Ser 12/24/2012, 9:37 AM [phone]

## 2012-12-25 LAB — BASIC METABOLIC PANEL
CO2: 25 mEq/L (ref 19–32)
Calcium: 8.6 mg/dL (ref 8.4–10.5)
Chloride: 100 mEq/L (ref 96–112)
Creatinine, Ser: 1.45 mg/dL — ABNORMAL HIGH (ref 0.50–1.35)
Glucose, Bld: 148 mg/dL — ABNORMAL HIGH (ref 70–99)
Sodium: 133 mEq/L — ABNORMAL LOW (ref 135–145)

## 2012-12-25 LAB — GLUCOSE, CAPILLARY: Glucose-Capillary: 114 mg/dL — ABNORMAL HIGH (ref 70–99)

## 2012-12-25 LAB — CBC
HCT: 25.9 % — ABNORMAL LOW (ref 39.0–52.0)
MCH: 32.2 pg (ref 26.0–34.0)
MCV: 95.9 fL (ref 78.0–100.0)
Platelets: 285 10*3/uL (ref 150–400)
RBC: 2.7 MIL/uL — ABNORMAL LOW (ref 4.22–5.81)
WBC: 5.7 10*3/uL (ref 4.0–10.5)

## 2012-12-25 MED ORDER — FUROSEMIDE 80 MG PO TABS
80.0000 mg | ORAL_TABLET | Freq: Every day | ORAL | Status: DC
  Administered 2012-12-25 – 2012-12-26 (×2): 80 mg via ORAL
  Filled 2012-12-25 (×2): qty 1

## 2012-12-25 MED ORDER — GLIPIZIDE ER 5 MG PO TB24
5.0000 mg | ORAL_TABLET | Freq: Every day | ORAL | Status: DC
  Administered 2012-12-25 – 2012-12-26 (×2): 5 mg via ORAL
  Filled 2012-12-25 (×3): qty 1

## 2012-12-25 MED ORDER — LEVOFLOXACIN 750 MG PO TABS
750.0000 mg | ORAL_TABLET | ORAL | Status: DC
  Administered 2012-12-25: 08:00:00 750 mg via ORAL
  Filled 2012-12-25 (×2): qty 1

## 2012-12-25 NOTE — Progress Notes (Signed)
Subjective: No complaints  Objective: Vital signs in last 24 hours: Temp:  [98.5 F (36.9 C)-99.3 F (37.4 C)] 99.3 F (37.4 C) (09/15 2135) Pulse Rate:  [83-103] 99 (09/15 2135) Resp:  [16-19] 19 (09/15 2135) BP: (139-164)/(52-78) 160/60 mmHg (09/15 2135) SpO2:  [94 %-99 %] 95 % (09/15 2135) Weight:  [97 kg (213 lb 13.5 oz)] 97 kg (213 lb 13.5 oz) (09/15 1610) Weight change:  Last BM Date: 12/22/12  Intake/Output from previous day: 09/15 0701 - 09/16 0700 In: 1170.7 [P.O.:586; I.V.:234.7; IV Piggyback:350] Out: -  Intake/Output this shift: Total I/O In: 300 [IV Piggyback:300] Out: -   General appearance: alert and cooperative Resp: few crackles at bases Cardio: irregularly irregular rhythm Extremities: extremities normal, atraumatic, no cyanosis or edema  Lab Results:  Recent Labs  12/23/12 0510 12/25/12 0355  WBC 6.0 5.7  HGB 9.0* 8.7*  HCT 27.2* 25.9*  PLT 305 285   BMET  Recent Labs  12/24/12 0500 12/25/12 0355  NA 136 133*  K 4.4 4.2  CL 101 100  CO2 25 25  GLUCOSE 131* 148*  BUN 22 33*  CREATININE 1.34 1.45*  CALCIUM 8.5 8.6    Studies/Results: Dg Chest 2 View  12/23/2012   CLINICAL DATA:  79 year old male with productive cough and weakness.  EXAM: CHEST  2 VIEW  COMPARISON:  12/21/2012 and earlier.  FINDINGS: Semi upright AP and lateral views of the chest. Stable cardiomegaly and mediastinal contours. Interval decreased widespread interstitial opacity. Decreased more confluent right upper lobe opacity. At the same time patchy opacity at the lung bases is stable. No pneumothorax. Small pleural effusions are less evident. There remains fluid in the pleural fissures.  IMPRESSION: Interval decreased opacity, favor regression of pulmonary edema, with improving infection a less likely consideration. Small pleural effusions also appear decreased. No areas of worsening ventilation.   Electronically Signed   By: Augusto Gamble M.D.   On: 12/23/2012 10:47     Medications: I have reviewed the patient's current medications.  Assessment/Plan: Principal Problem:  HAP (hospital-acquired pneumonia) change to po antibiotics Active Problems:  Heart failure, acute on chronic, systolic and diastolic - CXR improved, change to po lasix,  restarted ACEI and metoprolol and nitrate  T2DM (type 2 diabetes mellitus) reasonable control on SSI restart glipizide Hypertension BP up restarting meds as above  Anemia stable  UTI (lower urinary tract infection) On aztreonam, culture pending  Hypokalemia resolved       LOS: 4 days   [name] 12/25/2012, 5:25 AM

## 2012-12-26 LAB — BASIC METABOLIC PANEL
BUN: 38 mg/dL — ABNORMAL HIGH (ref 6–23)
Calcium: 8.8 mg/dL (ref 8.4–10.5)
Chloride: 100 mEq/L (ref 96–112)
Creatinine, Ser: 1.67 mg/dL — ABNORMAL HIGH (ref 0.50–1.35)
GFR calc Af Amer: 39 mL/min — ABNORMAL LOW (ref >90)

## 2012-12-26 LAB — URINE CULTURE: Colony Count: >=100000

## 2012-12-26 MED ORDER — LEVOFLOXACIN 750 MG PO TABS: 750.0000 mg | ORAL_TABLET | ORAL | Status: DC

## 2012-12-26 MED ORDER — POTASSIUM CHLORIDE CRYS ER 20 MEQ PO TBCR: 20.0000 meq | EXTENDED_RELEASE_TABLET | Freq: Every day | ORAL | Status: DC

## 2012-12-26 NOTE — Discharge Summary (Signed)
Physician Discharge Summary  Patient ID: [name] MRN: [id number] DOB/AGE: 1917/06/04 79 y.o.  Admit date: 12/21/2012 Discharge date: 12/26/2012  Admission Diagnoses: Congestive heart failure, systolic and diastolic Hospital-acquired pneumonia Type 2 diabetes mellitus Hypertension Anemia of kidney disease Urinary tract infection Chronic kidney disease stage III   Discharge Diagnoses:  Principal Problem:   Congestive heart failure, systolic and diastolic Active Problems:   Hospital-acquired pneumonia   T2DM (type 2 diabetes mellitus)   Hypertension   Anemia of kidney disease  Chronic kidney disease stage III   UTI (lower urinary tract infection)   Hypokalemia   Discharged Condition: good  Hospital Course: The patient presented on September 12 with 3 days of worsening shortness of breath cough and chest congestion. An ED he was found to have pulmonary infiltrates and pulmonary edema on chest x-ray with elevated BNP. He was admitted and started on IV vancomycin and aztreonam as well as IV Lasix. He diuresed well and followup chest x-ray showed improvement. He was switched to by mouth Levaquin as well as by mouth Lasix. Diabetes remained under good control. He did prove to have a gram-negative rod UTI which was treated with aztreonam and Levaquin, culture pending. His potassium dropped as low as 2.8 and was repleted with potassium. His hemoglobin was in the mid eights on iron, he had received a dose of Aranesp in late August and we'll be a candidate for second dose in late September. The patient is a full code. He was not compliant with low sodium diet prior to admission and this was stressed to the family. Physical therapy saw the patient and because of his deconditioned state a short-term nursing home stay for rehabilitation was felt to be appropriate.  Diet low sodium carbohydrate modified diet CODE STATUS full code  Consults: None  Significant Diagnostic Studies: labs: At  discharge sodium 133 potassium 4.1 chloride 100, BUN 38 creatinine 1.67, CBC WBC 5.7 hemoglobin 8.7 MCV 95.9 microbiology: urine culture: positive for Gram-negative rods, ID pending and radiology: CXR: September 14 decreased capacity showing progression of pulmonary edema  Treatments: antibiotics: vancomycin, Levaquin and aztreonam, cardiac meds: lisinopril (Prinivil) and furosemide and therapies: PT  Discharge Exam: Blood pressure 154/54, pulse 85, temperature 98.8 F (37.1 C), temperature source Oral, resp. rate 20, height 5\' 8"  (1.727 m), weight 96 kg (211 lb 10.3 oz), SpO2 99.00%. General appearance: alert and cooperative Resp: clear to auscultation bilaterally Cardio: irregularly irregular rhythm  Disposition: 03-skilled nursing facility   Future Appointments Provider Department Dept Phone   06/07/2013 11:00 AM [facility] [phone]   06/07/2013 12:00 PM Carma Lair Nickel, NP [facility] [phone]       Medication List         albuterol 108 (90 BASE) MCG/ACT inhaler  Commonly known as:  PROVENTIL HFA;VENTOLIN HFA  Inhale 2 puffs into the lungs every 6 (six) hours as needed for wheezing.     aspirin EC 81 MG tablet  Take 81 mg by mouth every morning.     beta carotene w/minerals tablet  Take 1 tablet by mouth every morning.     cilostazol 100 MG tablet  Commonly known as:  PLETAL  Take 100 mg by mouth 2 (two) times daily.     furosemide 40 MG tablet  Commonly known as:  LASIX  Take 40 mg by mouth daily.     glipiZIDE 5 MG 24 hr tablet  Commonly known as:  GLUCOTROL  XL  Take 1 tablet (5 mg total) by mouth daily with breakfast. Take 2 a day for 4 days then one a day     iron polysaccharides 150 MG capsule  Commonly known as:  NIFEREX  Take 1 capsule (150 mg total) by mouth daily.     isosorbide mononitrate 30 MG 24 hr tablet  Commonly known as:  IMDUR  Take 30 mg by mouth daily.      levofloxacin 750 MG tablet  Commonly known as:  LEVAQUIN  Take 1 tablet (750 mg total) by mouth every other day. For 3 doses     lisinopril 10 MG tablet  Commonly known as:  PRINIVIL,ZESTRIL  Take 10 mg by mouth daily.     metoprolol succinate 25 MG 24 hr tablet  Commonly known as:  TOPROL-XL  Take 25 mg by mouth daily after breakfast.     nitroGLYCERIN 0.4 MG SL tablet  Commonly known as:  NITROSTAT  Place 0.4 mg under the tongue every 5 (five) minutes as needed for chest pain.     potassium chloride SA 20 MEQ tablet  Commonly known as:  K-DUR,KLOR-CON  Take 1 tablet (20 mEq total) by mouth daily.     tamsulosin 0.4 MG Caps capsule  Commonly known as:  FLOMAX  Take 0.4 mg by mouth daily after breakfast.     vitamin B-12 1000 MCG tablet  Commonly known as:  CYANOCOBALAMIN  Take 1,000 mcg by mouth daily.           Follow-up Information   Follow up with Lillia Mountain, MD In 4 weeks.   Specialty:  Internal Medicine   Contact information:   [address], [address] Jaynie Crumble [place] [address] [phone]       Signed: Lillia Mountain 12/26/2012, 6:42 AM

## 2012-12-26 NOTE — Progress Notes (Signed)
Patient has been discharged, called 3 times to give report to skilled nursing facility but no one responded. Transport is here, will attempt to call again.

## 2012-12-26 NOTE — Progress Notes (Signed)
Patient is set to discharge to [facility] today. Patient & son at bedside aware. Discharge packet in [place]. [facility] scheduled for pickup (Service Request Id: [id number]). RN, Darcel Bayley made aware.   Clinical Social Work Department CLINICAL SOCIAL WORK PLACEMENT NOTE 12/26/2012  Patient:  [name], [name]  Account Number:  [id number] Admit date:  12/21/2012  Clinical Social Worker:  Orpah Greek  Date/time:  12/24/2012 12:58 PM  Clinical Social Work is seeking post-discharge placement for this patient at the following level of care:   SKILLED NURSING   (*CSW will update this form in Epic as items are completed)   12/24/2012  Patient/family provided with [facility] Department of Clinical Social Work's list of facilities offering this level of care within the geographic area requested by the patient (or if unable, by the patient's family).  12/24/2012  Patient/family informed of their freedom to choose among providers that offer the needed level of care, that participate in Medicare, Medicaid or managed care program needed by the patient, have an available bed and are willing to accept the patient.  12/24/2012  Patient/family informed of [facility]' ownership interest in [facility], as well as of the fact that they are under no obligation to receive care at this facility.  PASARR submitted to EDS on 12/24/2012 PASARR number received from EDS on 12/24/2012  FL2 transmitted to all facilities in geographic area requested by pt/family on  12/24/2012 FL2 transmitted to all facilities within larger geographic area on   Patient informed that his/her managed care company has contracts with or will negotiate with  certain facilities, including the following:     Patient/family informed of bed offers received:  12/24/2012 Patient chooses bed at [facility] Physician recommends and patient chooses bed at    Patient to be transferred to [facility] on  12/26/2012 Patient to be  transferred to facility by PT[facility]  The following physician request were entered in Epic:   Additional Comments:   Unice Bailey, LCSW [facility] Clinical Social Worker cell #: [phone]

## 2012-12-27 ENCOUNTER — Non-Acute Institutional Stay (SKILLED_NURSING_FACILITY): Payer: Medicare HMO | Admitting: Adult Health

## 2012-12-27 DIAGNOSIS — I509 Heart failure, unspecified: Secondary | ICD-10-CM

## 2012-12-27 DIAGNOSIS — I739 Peripheral vascular disease, unspecified: Secondary | ICD-10-CM

## 2012-12-27 DIAGNOSIS — J189 Pneumonia, unspecified organism: Secondary | ICD-10-CM

## 2012-12-27 DIAGNOSIS — I4891 Unspecified atrial fibrillation: Secondary | ICD-10-CM

## 2012-12-27 DIAGNOSIS — E119 Type 2 diabetes mellitus without complications: Secondary | ICD-10-CM

## 2012-12-27 DIAGNOSIS — D649 Anemia, unspecified: Secondary | ICD-10-CM

## 2012-12-27 DIAGNOSIS — E876 Hypokalemia: Secondary | ICD-10-CM

## 2012-12-27 DIAGNOSIS — I10 Essential (primary) hypertension: Secondary | ICD-10-CM

## 2012-12-28 LAB — CULTURE, BLOOD (ROUTINE X 2): Culture: NO GROWTH

## 2013-01-02 ENCOUNTER — Non-Acute Institutional Stay (SKILLED_NURSING_FACILITY): Payer: Medicare HMO | Admitting: Internal Medicine

## 2013-01-02 DIAGNOSIS — I509 Heart failure, unspecified: Secondary | ICD-10-CM

## 2013-01-02 DIAGNOSIS — E1129 Type 2 diabetes mellitus with other diabetic kidney complication: Secondary | ICD-10-CM

## 2013-01-02 DIAGNOSIS — J189 Pneumonia, unspecified organism: Secondary | ICD-10-CM

## 2013-01-03 DIAGNOSIS — J189 Pneumonia, unspecified organism: Secondary | ICD-10-CM | POA: Insufficient documentation

## 2013-01-03 DIAGNOSIS — I739 Peripheral vascular disease, unspecified: Secondary | ICD-10-CM | POA: Insufficient documentation

## 2013-01-03 NOTE — Progress Notes (Signed)
Patient ID: [name], male   DOB: November 15, 1917, 79 y.o.   MRN: [id number]        PROGRESS NOTE  DATE: 12/27/2012  FACILITY: Nursing Home Location: [facility]  LEVEL OF CARE: SNF (31)  Acute Visit  CHIEF COMPLAINT:  Follow-up hospitalization  HISTORY OF PRESENT ILLNESS: This is a 79 year old male who has been admitted to [facility] on 12/26/12 from [facility] with principal diagnosis of CHF, systolic and diastolic. He has been admitted for a short-term rehabilitation.  REASSESSMENT OF ONGOING PROBLEM(S):  PNEUMONIA (hospital-acquired): The pneumonia remains stable.  The patient denies ongoing chest pain, cough, shortness of breath, fever, chills or night sweats. No complications reported from the current antibiotic being used.  HTN: Pt 's HTN remains stable.  Denies CP, sob, DOE, pedal edema, headaches, dizziness or visual disturbances.  No complications from the medications currently being used.  Last BP :117/69  DM:pt's DM remains stable.  Pt denies polyuria, polydipsia, polyphagia, changes in vision or hypoglycemic episodes.  No complications noted from the medication presently being used. 9/14  hemoglobin A1c 6.8  PAST MEDICAL HISTORY : Reviewed.  No changes.  CURRENT MEDICATIONS: Reviewed per [facility]  REVIEW OF SYSTEMS:  GENERAL: no change in appetite, no fatigue, no weight changes, no fever, chills or weakness RESPIRATORY: no cough, SOB, DOE, wheezing, hemoptysis CARDIAC: no chest pain, edema or palpitations GI: no abdominal pain, diarrhea, constipation, heart burn, nausea or vomiting  PHYSICAL EXAMINATION  VS:  T97.3       P81     RR16      B117/69P     POX96 %     WT207 (Lb)  GENERAL: no acute distress, normal body habitus EYES: conjunctivae normal, sclerae normal, normal eye lids NECK: supple, trachea midline, no neck masses, no thyroid tenderness, no thyromegaly LYMPHATICS: no LAN in the neck, no supraclavicular LAN RESPIRATORY:  breathing is even & unlabored, BS CTAB CARDIAC: RRR, no murmur,no extra heart sounds, no edema GI: abdomen soft, normal BS, no masses, no tenderness, no hepatomegaly, no splenomegaly PSYCHIATRIC: the patient is alert & oriented to person, affect & behavior appropriate  LABS/RADIOLOGY: 12/22/12  Wbc 5.6  hgb 9.5  hct 28.4  NA 139  K2.8  Glucose 138  BUN 14  Creatinine 1.30  Calcium 8.8 hgbA1c 6.8   ASSESSMENT/PLAN:  CHF, systolic and diastolic - stable; for rehabilitation  Pneumonia (hospital-acquired) - continue Levaquin  Atrial Fibrillation - rate-controlled  Anemia - stable; continue Niferex  Hypertension - stable  Hypokalemia - continue supplementation  PVD - stable  Diabetes Mellitus, type 2 - well-controlled   CPT CODE: 78295

## 2013-01-09 HISTORY — PX: LOWER EXTREMITY ANGIOGRAM: SHX5955

## 2013-01-15 ENCOUNTER — Non-Acute Institutional Stay (SKILLED_NURSING_FACILITY): Payer: Medicare HMO | Admitting: Adult Health

## 2013-01-15 DIAGNOSIS — L03032 Cellulitis of left toe: Secondary | ICD-10-CM

## 2013-01-15 DIAGNOSIS — L02619 Cutaneous abscess of unspecified foot: Secondary | ICD-10-CM

## 2013-01-22 ENCOUNTER — Encounter: Payer: Self-pay | Admitting: Adult Health

## 2013-01-24 ENCOUNTER — Encounter: Payer: Self-pay | Admitting: Family

## 2013-01-24 ENCOUNTER — Other Ambulatory Visit: Payer: Self-pay | Admitting: *Deleted

## 2013-01-24 DIAGNOSIS — L97909 Non-pressure chronic ulcer of unspecified part of unspecified lower leg with unspecified severity: Secondary | ICD-10-CM

## 2013-01-25 ENCOUNTER — Encounter: Payer: Self-pay | Admitting: Family

## 2013-01-25 ENCOUNTER — Ambulatory Visit (HOSPITAL_COMMUNITY)
Admission: RE | Admit: 2013-01-25 | Discharge: 2013-01-25 | Disposition: A | Discharge status: Home / Self Care | Payer: Medicare HMO | Source: Ambulatory Visit | Attending: Family | Admitting: Family

## 2013-01-25 ENCOUNTER — Ambulatory Visit (INDEPENDENT_AMBULATORY_CARE_PROVIDER_SITE_OTHER): Payer: Medicare HMO | Admitting: Family

## 2013-01-25 VITALS — BP 147/65 | HR 46 | Temp 97.1°F | Resp 14 | Ht 68.0 in | Wt 216.0 lb

## 2013-01-25 DIAGNOSIS — L03032 Cellulitis of left toe: Secondary | ICD-10-CM | POA: Insufficient documentation

## 2013-01-25 DIAGNOSIS — I739 Peripheral vascular disease, unspecified: Secondary | ICD-10-CM

## 2013-01-25 DIAGNOSIS — L97909 Non-pressure chronic ulcer of unspecified part of unspecified lower leg with unspecified severity: Secondary | ICD-10-CM | POA: Insufficient documentation

## 2013-01-25 NOTE — Patient Instructions (Addendum)
Peripheral Vascular Disease Peripheral Vascular Disease (PVD), also called Peripheral Arterial Disease (PAD), is a circulation problem caused by cholesterol (atherosclerotic plaque) deposits in the arteries. PVD commonly occurs in the lower extremities (legs) but it can occur in other areas of the body, such as your arms. The cholesterol buildup in the arteries reduces blood flow which can cause pain and other serious problems. The presence of PVD can place a person at risk for Coronary Artery Disease (CAD).  CAUSES  Causes of PVD can be many. It is usually associated with more than one risk factor such as:   High Cholesterol.  Smoking.  Diabetes.  Lack of exercise or inactivity.  High blood pressure (hypertension).  Obesity.  Family history. SYMPTOMS   When the lower extremities are affected, patients with PVD may experience:  Leg pain with exertion or physical activity. This is called INTERMITTENT CLAUDICATION. This may present as cramping or numbness with physical activity. The location of the pain is associated with the level of blockage. For example, blockage at the abdominal level (distal abdominal aorta) may result in buttock or hip pain. Lower leg arterial blockage may result in calf pain.  As PVD becomes more severe, pain can develop with less physical activity.  In people with severe PVD, leg pain may occur at rest.  Other PVD signs and symptoms:  Leg numbness or weakness.  Coldness in the affected leg or foot, especially when compared to the other leg.  A change in leg color.  Patients with significant PVD are more prone to ulcers or sores on toes, feet or legs. These may take longer to heal or may reoccur. The ulcers or sores can become infected.  If signs and symptoms of PVD are ignored, gangrene may occur. This can result in the loss of toes or loss of an entire limb.  Not all leg pain is related to PVD. Other medical conditions can cause leg pain such  as:  Blood clots (embolism) or Deep Vein Thrombosis.  Inflammation of the blood vessels (vasculitis).  Spinal stenosis. DIAGNOSIS  Diagnosis of PVD can involve several different types of tests. These can include:  Pulse Volume Recording Method (PVR). This test is simple, painless and does not involve the use of X-rays. PVR involves measuring and comparing the blood pressure in the arms and legs. An ABI (Ankle-Brachial Index) is calculated. The normal ratio of blood pressures is 1. As this number becomes smaller, it indicates more severe disease.  < 0.95  indicates significant narrowing in one or more leg vessels.  <0.8 there will usually be pain in the foot, leg or buttock with exercise.  <0.4 will usually have pain in the legs at rest.  <0.25  usually indicates limb threatening PVD.  Doppler detection of pulses in the legs. This test is painless and checks to see if you have a pulses in your legs/feet.  A dye or contrast material (a substance that highlights the blood vessels so they show up on x-ray) may be given to help your caregiver better see the arteries for the following tests. The dye is eliminated from your body by the kidney's. Your caregiver may order blood work to check your kidney function and other laboratory values before the following tests are performed:  Magnetic Resonance Angiography (MRA). An MRA is a picture study of the blood vessels and arteries. The MRA machine uses a large magnet to produce images of the blood vessels.  Computed Tomography Angiography (CTA). A CTA is a   specialized x-ray that looks at how the blood flows in your blood vessels. An IV may be inserted into your arm so contrast dye can be injected.  Angiogram. Is a procedure that uses x-rays to look at your blood vessels. This procedure is minimally invasive, meaning a small incision (cut) is made in your groin. A small tube (catheter) is then inserted into the artery of your groin. The catheter is  guided to the blood vessel or artery your caregiver wants to examine. Contrast dye is injected into the catheter. X-rays are then taken of the blood vessel or artery. After the images are obtained, the catheter is taken out. TREATMENT  Treatment of PVD involves many interventions which may include:  Lifestyle changes:  Quitting smoking.  Exercise.  Following a low fat, low cholesterol diet.  Control of diabetes.  Foot care is very important to the PVD patient. Good foot care can help prevent infection.  Medication:  Cholesterol-lowering medicine.  Blood pressure medicine.  Anti-platelet drugs.  Certain medicines may reduce symptoms of Intermittent Claudication.  Interventional/Surgical options:  Angioplasty. An Angioplasty is a procedure that inflates a balloon in the blocked artery. This opens the blocked artery to improve blood flow.  Stent Implant. A wire mesh tube (stent) is placed in the artery. The stent expands and stays in place, allowing the artery to remain open.  Peripheral Bypass Surgery. This is a surgical procedure that reroutes the blood around a blocked artery to help improve blood flow. This type of procedure may be performed if Angioplasty or stent implants are not an option. SEEK IMMEDIATE MEDICAL CARE IF:   You develop pain or numbness in your arms or legs.  Your arm or leg turns cold, becomes blue in color.  You develop redness, warmth, swelling and pain in your arms or legs. MAKE SURE YOU:   Understand these instructions.  Will watch your condition.  Will get help right away if you are not doing well or get worse. Document Released: 05/05/2004 Document Revised: 06/20/2011 Document Reviewed: 04/01/2008 [facility] Patient Information 2014 [place], [place].   Arteriogram  An arteriogram (or angiogram) is an X-ray test of your blood vessels. You will be awake during the test. This test looks for:  Blocked blood vessels.  Blood vessels that are  not normal. BEFORE THE TEST Schedule an appointment for your arteriogram. Let the personknow if:  You have diabetes.  You are allergic to any food or medicine.  You are allergic to X-ray dye (contrast).  You have asthma or had it when you were a child.  You are pregnant or you might be pregnant.  You are taking aspirin or blood thinners. The night before the test:   Do not  eat or drink anything after midnight. On the morning of your test:   Do not drive. Have someone bring you and take you home.  Bring all the medicines you need to take with you. If you have diabetes, do not take your insulin before the test. Take your medicines when the test is over.  You will need to sign a form that says you agree to have the test (consent form). THE TEST  You will lie on the X-ray table.  An IV tube is started in your arm.  Your blood pressure and heartbeat are checked during the test.  The upper part of your leg is shaved and washed with special soap.  You are then covered with a germ free (sterile) sheet. Keep your arms  at your side under the sheet at all times so that germs do not get on the sheet.  The skin is numbed where the thin tube (catheter) is put into your upper leg. The thin tube is moved up into the blood vessels that your doctor wants to see.  Dye is put in through the tube. You may have a feeling of heat as the dye moves into your body.  X-ray pictures of your blood vessels are taken. Do not move while the dye goes in and pictures are taken. AFTER THE TEST  The thin tube will be taken out of your upper leg.  The nurse will check:  Your blood pressure.  The place where the thin tube was taken out to make sure it is not bleeding.  The blood flow to your feet.  Lie flat in bed. Keep your leg straight for 6 hours after the thin tube is taken out. Finding out the results of your test Ask when your test results will be ready. Make sure you get your test  results. Document Released: 06/24/2008 Document Revised: 06/20/2011 Document Reviewed: 06/24/2008 [facility] Patient Information 2014 [place], [place].

## 2013-01-25 NOTE — Progress Notes (Signed)
Patient ID: [name], male   DOB: 1917/09/13, 79 y.o.   MRN: [id number]       PROGRESS NOTE  DATE: 01/15/2013  FACILITY:  [facility]  LEVEL OF CARE: SNF (31)  Acute Visit  CHIEF COMPLAINT:  Manage Cellulitis  HISTORY OF PRESENT ILLNESS: This is a 79 year old male who had his left foot great toe nail fell off yesterday. Skin is warm to touch. No drainage noted.  PAST MEDICAL HISTORY : Reviewed.  No changes.  CURRENT MEDICATIONS: Reviewed per [facility]  REVIEW OF SYSTEMS:  GENERAL: no change in appetite, no fatigue, no weight changes, no fever, chills or weakness RESPIRATORY: no cough, SOB, DOE,, wheezing, hemoptysis CARDIAC: no chest pain, edema or palpitations GI: no abdominal pain, diarrhea, constipation, heart burn, nausea or vomiting  PHYSICAL EXAMINATION  VS:  T97        P59       RR18       BP125/93      POX96 %       WT208.2 (Lb)  GENERAL: no acute distress, normal body habitus SKIN:  Left foot great toe is erythematous and warm to touch NECK: supple, trachea midline, no neck masses, no thyroid tenderness, no thyromegaly LYMPHATICS: no LAN in the neck, no supraclavicular LAN RESPIRATORY: breathing is even & unlabored, BS CTAB CARDIAC: RRR, no murmur,no extra heart sounds, no edema GI: abdomen soft, normal BS, no masses, no tenderness, no hepatomegaly, no splenomegaly PSYCHIATRIC: the patient is alert & oriented to person, affect & behavior appropriate  LABS/RADIOLOGY: 01/15/13 BNP 71 01/10/13 sodium 136 potassium or 0.6 glucose 128 BUN 43 creatinine 1.9 calcium 9.1 01/03/13 WBC 9.2 hemoglobin 10.8 hematocrit 34.4 MCV 100.3 sodium 139 potassium 4.7 glucose 128 BUN 43 creatinine 1.8 calcium 9.4 12/22/12  Wbc 5.6  hgb 9.5  hct 28.4  NA 139  K2.8  Glucose 138  BUN 14  Creatinine 1.30  Calcium 8.8 hgbA1c 6.8     ASSESSMENT/PLAN:  Cellulitis of left foot great toe - start Doxycycline 100 mg 1 PO BID x 10 days; soak left foot great toe with warm wash  cloth BID x 2 weeks   CPT CODE: 40981

## 2013-01-25 NOTE — Progress Notes (Signed)
This encounter was created in error - please disregard.
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
## 2013-01-30 ENCOUNTER — Other Ambulatory Visit: Payer: Self-pay

## 2013-01-30 ENCOUNTER — Encounter (HOSPITAL_COMMUNITY): Payer: Self-pay

## 2013-01-31 ENCOUNTER — Encounter (HOSPITAL_COMMUNITY)
Admission: RE | Disposition: A | Discharge status: Skilled Nursing Facility | Payer: Self-pay | Source: Ambulatory Visit | Attending: Vascular Surgery

## 2013-01-31 ENCOUNTER — Ambulatory Visit (HOSPITAL_COMMUNITY)
Admission: RE | Admit: 2013-01-31 | Discharge: 2013-01-31 | Disposition: A | Discharge status: Skilled Nursing Facility | Payer: Medicare HMO | Source: Ambulatory Visit | Attending: Vascular Surgery | Admitting: Vascular Surgery

## 2013-01-31 ENCOUNTER — Telehealth: Payer: Self-pay | Admitting: Vascular Surgery

## 2013-01-31 DIAGNOSIS — Z79899 Other long term (current) drug therapy: Secondary | ICD-10-CM | POA: Insufficient documentation

## 2013-01-31 DIAGNOSIS — I70209 Unspecified atherosclerosis of native arteries of extremities, unspecified extremity: Secondary | ICD-10-CM | POA: Insufficient documentation

## 2013-01-31 DIAGNOSIS — I10 Essential (primary) hypertension: Secondary | ICD-10-CM | POA: Insufficient documentation

## 2013-01-31 DIAGNOSIS — E1159 Type 2 diabetes mellitus with other circulatory complications: Secondary | ICD-10-CM | POA: Insufficient documentation

## 2013-01-31 DIAGNOSIS — I70229 Atherosclerosis of native arteries of extremities with rest pain, unspecified extremity: Secondary | ICD-10-CM

## 2013-01-31 DIAGNOSIS — E785 Hyperlipidemia, unspecified: Secondary | ICD-10-CM | POA: Insufficient documentation

## 2013-01-31 DIAGNOSIS — Z8673 Personal history of transient ischemic attack (TIA), and cerebral infarction without residual deficits: Secondary | ICD-10-CM | POA: Insufficient documentation

## 2013-01-31 DIAGNOSIS — Z8551 Personal history of malignant neoplasm of bladder: Secondary | ICD-10-CM | POA: Insufficient documentation

## 2013-01-31 HISTORY — PX: ABDOMINAL AORTAGRAM: SHX5454

## 2013-01-31 HISTORY — PX: LOWER EXTREMITY ANGIOGRAM: SHX5508

## 2013-01-31 LAB — POCT I-STAT, CHEM 8
BUN: 52 mg/dL — ABNORMAL HIGH (ref 6–23)
Creatinine, Ser: 2.2 mg/dL — ABNORMAL HIGH (ref 0.50–1.35)
Potassium: 4.4 mEq/L (ref 3.5–5.1)
Sodium: 144 mEq/L (ref 135–145)
TCO2: 24 mmol/L (ref 0–100)

## 2013-01-31 SURGERY — ABDOMINAL AORTAGRAM
Anesthesia: LOCAL

## 2013-01-31 MED ORDER — SODIUM CHLORIDE 0.9 % IV SOLN
INTRAVENOUS | Status: DC
  Administered 2013-01-31: 07:00:00 via INTRAVENOUS

## 2013-01-31 MED ORDER — OXYCODONE-ACETAMINOPHEN 5-325 MG PO TABS: 1.0000 | ORAL_TABLET | ORAL | Status: DC | PRN

## 2013-01-31 MED ORDER — LIDOCAINE HCL (PF) 1 % IJ SOLN
INTRAMUSCULAR | Status: AC
  Filled 2013-01-31: qty 30

## 2013-01-31 MED ORDER — ACETAMINOPHEN 325 MG PO TABS: 650.0000 mg | ORAL_TABLET | ORAL | Status: DC | PRN

## 2013-01-31 MED ORDER — ONDANSETRON HCL 4 MG/2ML IJ SOLN: 4.0000 mg | Freq: Four times a day (QID) | INTRAMUSCULAR | Status: DC | PRN

## 2013-01-31 MED ORDER — HYDRALAZINE HCL 20 MG/ML IJ SOLN: 10.0000 mg | INTRAMUSCULAR | Status: DC | PRN

## 2013-01-31 MED ORDER — SODIUM CHLORIDE 0.9 % IV SOLN: 1.0000 mL/kg/h | INTRAVENOUS | Status: DC

## 2013-01-31 MED ORDER — HEPARIN (PORCINE) IN NACL 2-0.9 UNIT/ML-% IJ SOLN
INTRAMUSCULAR | Status: AC
Start: 2013-01-31 — End: 2013-01-31
  Filled 2013-01-31: qty 1000

## 2013-01-31 MED ORDER — MORPHINE SULFATE 2 MG/ML IJ SOLN
1.0000 mg | INTRAMUSCULAR | Status: DC | PRN
Start: 2013-01-31 — End: 2013-01-31

## 2013-01-31 NOTE — Progress Notes (Signed)
Unable to obtain IV access; IV Therapy paged

## 2013-01-31 NOTE — Op Note (Addendum)
OPERATIVE NOTE   PROCEDURE: 1.  Right common femoral artery cannulation under ultrasound guidance 2.  Aortogram with carbon dioxide 3.  Third order arterial selection 4.  Left leg runoff with carbon dioxide and contrast  PRE-OPERATIVE DIAGNOSIS: left foot critical limb ischemia   POST-OPERATIVE DIAGNOSIS: same as above   SURGEON: Leonides Sake, MD  ANESTHESIA: conscious sedation  ESTIMATED BLOOD LOSS: 30 cc  CONTRAST: 40 cc contrast, 180 cc carbon dioxide  FINDING(S):  Aorta: patent  Superior mesenteric artery: patent Celiac artery: not seen   Right Left  RA Patent Patent  CIA Patent Patent  EIA Patent Patent  IIA Patent Patent  CFA Patent Patent  SFA  Patent but calcified  PFA  Patent  Pop  Patent  Trif  Patent  AT  Occludes just off takeoff  Pero  Occludes proximally but reconstitutes shortly afterward from collaterals  PT  Occludes shortly after takeoff  Left foot: only collaterals from peroneal feed foot  SPECIMEN(S):  none  INDICATIONS:   [name] is a 79 y.o. male who presents with left foot great toe ischemia.  The patient presents for: aortogram, bilateral leg runoff, and possible intervention.  On the day of presentation, his creatinine had deteriorated so I decided to proceed with carbon dioxide and limit contrast use and restrict the study to the left leg.  I discussed with the patient the nature of angiographic procedures, especially the limited patencies of any endovascular intervention.  The patient is aware of that the risks of an angiographic procedure include but are not limited to: bleeding, infection, access site complications, renal failure, embolization, rupture of vessel, dissection, possible need for emergent surgical intervention, possible need for surgical procedures to treat the patient's pathology, and stroke and death.  The patient is aware of the risks and agrees to proceed.  DESCRIPTION: After full informed consent was obtained from  the patient, the patient was brought back to the angiography suite.  The patient was placed supine upon the angiography table and connected to monitoring equipment.  The patient was then given conscious sedation, the amounts of which are documented in the patient's chart.  The patient was prepped and drape in the standard fashion for an angiographic procedure.  At this point, attention was turned to the right groin.  Under ultrasound guidance, the right common femoral artery will be cannulated with a 18 gauge needle.  The [facility] wire was passed up into the aorta.  The needle was exchanged for a 5-Fr sheath, which was advanced over the wire into the common femoral artery.  The dilator was then removed.  The Omniflush catheter was then loaded over the wire up to the level of L1.  The catheter was connected to the carbon dioxide circuit.  After de-airring and de-clotting the circuit, a carbon dioxide aortogram was completed.  I then pulled the catheter down to the distal aorta and pelvic injection was completed with carbon dioxide.  The [facility] wire was replaced in the catheter, and using the [place] and Omniflush catheter, the left common iliac artery was selected.  The catheter and wire were advanced into the external iliac artery.  The catheter was reconnected to the carbon dioxide circuit.  Hand injection imaged the femoral bifurcation and proximal superficial femoral artery but the distal superficial femoral artery and popliteal could not be imaged.  I replaced the wire in the catheter and selected the left superficial femoral artery.  The catheter was exchanged for a end-hole catheter in  the superficial femoral artery.  The wire was removed and the catheter was connected to the carbon dioxide circuit.  Hand injections continued down to the tibial level, where the images degraded.  At this point, the patient was connected to the power injector circuit.  Stationary injections from the distal superficial femoral  artery distal were completed.  The patient had difficulties holding still for these images.  Eventually, we were able to get the images documented above. At this point, I removed the end-hole catheter.  The sheath was aspirated.  No clots were present and the sheath was reloaded with heparinized saline.    Based the images, I have two concerns with an endovascular intervention.  First, the patient has single vessel runoff with poor cardiac function evident based on transient time of the contrast.  Second, the patient is not able to fully cooperate with holding still leading to image degradation, which problematic in a patient with severe tibial artery disease.   Subsequently, I would favor medical management, including cardiology optimization, with low threshold to return to hybrid OR with deep MAC sedation for a repeat attempt at intervention.   I would also consider using Warfarin as an adjacent for continued tibial artery patency.  COMPLICATIONS: none  CONDITION: stable  Leonides Sake, MD [facility] [place] Office: [phone] Pager: [phone]  01/31/2013, 9:05 AM

## 2013-01-31 NOTE — Interval H&P Note (Signed)
[facility] [place]  History and Physical Update  The patient was interviewed and re-examined.  The patient's previous History and Physical has been reviewed and is unchanged.  There is no change in the plan of care: aortogram with CO2, left leg runoff, and possible intervention.  Leonides Sake, MD [facility] [place] Office: [phone] Pager: [phone]  01/31/2013, 7:56 AM
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
## 2013-01-31 NOTE — Progress Notes (Signed)
Discharge instruction given per MD order.  CG was able to verbalize understanding.  Pt to special transport via wheelchair.

## 2013-01-31 NOTE — Telephone Encounter (Addendum)
Message copied by Fredrich Birks on Thu Jan 31, 2013 10:49 AM ------      Message from: Melene Plan      Created: Thu Jan 31, 2013  9:42 AM                   ----- Message -----         From: Fransisco Hertz, MD         Sent: 01/31/2013   9:17 AM           To: Reuel Derby, Melene Plan, RN            [name]      [id number]      [birth year]            FPROCEDURE:      1.  right common femoral artery cannulation under ultrasound guidance      2.  Aortogram with carbon dioxide      3.  Third order arterial selection      4.  Left leg runoff with carbon dioxide and contrast            Follow-up: 4 weeks       ------  01/31/13: lm for patient, dpm

## 2013-01-31 NOTE — Progress Notes (Signed)
Notified dr Imogene Burn of Cr 2.2, will use CO2 for the procedure
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
## 2013-02-05 DIAGNOSIS — N183 Chronic kidney disease, stage 3 unspecified: Secondary | ICD-10-CM | POA: Insufficient documentation

## 2013-02-07 ENCOUNTER — Ambulatory Visit: Payer: Medicare HMO | Admitting: Cardiology

## 2013-02-08 ENCOUNTER — Non-Acute Institutional Stay (SKILLED_NURSING_FACILITY): Payer: Medicare HMO | Admitting: Adult Health

## 2013-02-08 DIAGNOSIS — I5032 Chronic diastolic (congestive) heart failure: Secondary | ICD-10-CM

## 2013-02-08 DIAGNOSIS — D649 Anemia, unspecified: Secondary | ICD-10-CM

## 2013-02-08 DIAGNOSIS — E876 Hypokalemia: Secondary | ICD-10-CM

## 2013-02-08 DIAGNOSIS — I10 Essential (primary) hypertension: Secondary | ICD-10-CM

## 2013-02-08 DIAGNOSIS — E1129 Type 2 diabetes mellitus with other diabetic kidney complication: Secondary | ICD-10-CM

## 2013-02-08 DIAGNOSIS — I739 Peripheral vascular disease, unspecified: Secondary | ICD-10-CM

## 2013-02-08 DIAGNOSIS — I4891 Unspecified atrial fibrillation: Secondary | ICD-10-CM

## 2013-02-09 DIAGNOSIS — E119 Type 2 diabetes mellitus without complications: Secondary | ICD-10-CM

## 2013-02-09 DIAGNOSIS — J189 Pneumonia, unspecified organism: Secondary | ICD-10-CM

## 2013-02-09 DIAGNOSIS — I509 Heart failure, unspecified: Secondary | ICD-10-CM

## 2013-02-09 DIAGNOSIS — R262 Difficulty in walking, not elsewhere classified: Secondary | ICD-10-CM

## 2013-02-14 ENCOUNTER — Ambulatory Visit: Payer: Medicare HMO | Admitting: Cardiology

## 2013-02-18 ENCOUNTER — Ambulatory Visit: Payer: Medicare HMO | Admitting: Cardiology

## 2013-02-20 ENCOUNTER — Ambulatory Visit (INDEPENDENT_AMBULATORY_CARE_PROVIDER_SITE_OTHER): Payer: Medicare HMO | Admitting: Cardiology

## 2013-02-20 ENCOUNTER — Encounter: Payer: Self-pay | Admitting: Cardiology

## 2013-02-20 VITALS — BP 96/51 | HR 56 | Ht 69.0 in | Wt 216.0 lb

## 2013-02-20 DIAGNOSIS — R609 Edema, unspecified: Secondary | ICD-10-CM

## 2013-02-20 DIAGNOSIS — I10 Essential (primary) hypertension: Secondary | ICD-10-CM | POA: Diagnosis not present

## 2013-02-20 DIAGNOSIS — I350 Nonrheumatic aortic (valve) stenosis: Secondary | ICD-10-CM

## 2013-02-20 DIAGNOSIS — I5032 Chronic diastolic (congestive) heart failure: Secondary | ICD-10-CM

## 2013-02-20 DIAGNOSIS — I4891 Unspecified atrial fibrillation: Secondary | ICD-10-CM

## 2013-02-20 DIAGNOSIS — I77810 Thoracic aortic ectasia: Secondary | ICD-10-CM

## 2013-02-20 DIAGNOSIS — I2510 Atherosclerotic heart disease of native coronary artery without angina pectoris: Secondary | ICD-10-CM

## 2013-02-20 DIAGNOSIS — R600 Localized edema: Secondary | ICD-10-CM

## 2013-02-20 DIAGNOSIS — I359 Nonrheumatic aortic valve disorder, unspecified: Secondary | ICD-10-CM

## 2013-02-20 DIAGNOSIS — E785 Hyperlipidemia, unspecified: Secondary | ICD-10-CM

## 2013-02-20 LAB — BASIC METABOLIC PANEL
BUN: 58 mg/dL — ABNORMAL HIGH (ref 6–23)
CO2: 19 mEq/L (ref 19–32)
Calcium: 9 mg/dL (ref 8.4–10.5)
Chloride: 107 mEq/L (ref 96–112)
Creatinine, Ser: 2.2 mg/dL — ABNORMAL HIGH (ref 0.4–1.5)
Glucose, Bld: 190 mg/dL — ABNORMAL HIGH (ref 70–99)

## 2013-02-20 MED ORDER — FUROSEMIDE 20 MG PO TABS: 20.0000 mg | ORAL_TABLET | Freq: Every day | ORAL | Status: DC

## 2013-02-20 NOTE — Patient Instructions (Signed)
Your physician has recommended you make the following change in your medication: Decrease Lasix to 20 MG daily  Please go over to the lab today to have a Basic Metabolic Panel Drawn  Your physician recommends that you return for lab work in: One week on 02/27/13 for another Basic Metabolic Panel  Your physician recommends that you schedule a follow-up appointment in: 3 Months with Dr. Mayford Knife

## 2013-02-20 NOTE — Progress Notes (Signed)
[address] [place], [place]  [address] Phone: [phone] Fax:  [phone]  Date:  02/20/2013   ID:  [name], DOB 04-Oct-1917, MRN [id number]  PCP:  Lillia Mountain, MD  Cardiologist:  Armanda Magic, MD   CC:  6 month followup   History of Present Illness: [name] is a 79 y.o. male with a history of ASCAD, chronic diastolic CHF, afib, chronic kidney disease, HTN, dyslipidemia and chronic LE edema who presents today for followup.  He is doing well.  He denies any chest pain, SOB, DOE, dizziness, palpitations or syncope.  He occasionally has some LE edema.  He was hospitalized a few months ago with PNA and CHF and then went to rehab for 6 weeks.   Wt Readings from Last 3 Encounters:  02/20/13 216 lb (97.977 kg)  01/31/13 216 lb (97.977 kg)  01/31/13 216 lb (97.977 kg)     Past Medical History  Diagnosis Date  . Macular degeneration   . Bladder tumor 1998  . Cancer 2001    bladder  . Peripheral vascular disease   . Anemia   . TIA (transient ischemic attack)   . Low back pain   . Pneumonia Sept. 2014  . Chronic kidney disease     Stage III  . UTI (lower urinary tract infection)   . Coronary artery disease 02/2005    PCI with DES of RCA and LAD with normal LVF  . Diabetes mellitus   . Hypertension   . Chronic atrial fibrillation   . Carotid artery occlusion     s/p right CEA  . Hyperlipidemia   . Edema extremities   . Combined systolic/diastolic CHF (congestive heart failure) Sept. 2014    EF 45-50%    Current Outpatient Prescriptions  Medication Sig Dispense Refill  . acetaminophen (TYLENOL) 325 MG tablet Take 650 mg by mouth every 4 (four) hours as needed for pain.      Marland Kitchen albuterol (PROVENTIL HFA;VENTOLIN HFA) 108 (90 BASE) MCG/ACT inhaler Inhale 2 puffs into the lungs every 6 (six) hours as needed for wheezing.  1 Inhaler  2  . aspirin EC 81 MG tablet Take 81 mg by mouth daily.       . cilostazol (PLETAL) 100 MG tablet Take 100  mg by mouth 2 (two) times daily.      . furosemide (LASIX) 40 MG tablet Take 40 mg by mouth daily.       Marland Kitchen glipiZIDE (GLUCOTROL XL) 5 MG 24 hr tablet Take 5 mg by mouth daily with breakfast.      . iron polysaccharides (FERREX 150) 150 MG capsule Take 150 mg by mouth daily.      . isosorbide mononitrate (IMDUR) 30 MG 24 hr tablet Take 30 mg by mouth daily.      Marland Kitchen lisinopril (PRINIVIL,ZESTRIL) 10 MG tablet Take 10 mg by mouth daily.      . metoprolol succinate (TOPROL-XL) 25 MG 24 hr tablet Take 25 mg by mouth daily after breakfast.      . nitroGLYCERIN (NITROSTAT) 0.4 MG SL tablet Place 0.4 mg under the tongue every 5 (five) minutes as needed for chest pain.      . potassium chloride SA (K-DUR,KLOR-CON) 20 MEQ tablet Take 1 tablet (20 mEq total) by mouth daily.  30 tablet    . tamsulosin (FLOMAX) 0.4 MG CAPS Take 0.4 mg by mouth daily after breakfast.      . vitamin B-12 (CYANOCOBALAMIN) 1000  MCG tablet Take 1,000 mcg by mouth daily.      . beta carotene w/minerals (OCUVITE) tablet Take 1 tablet by mouth daily.        No current facility-administered medications for this visit.    Allergies:    Allergies  Allergen Reactions  . Penicillins Swelling  . Sulfa Antibiotics Other (See Comments)    unknown    Social History:  The patient  reports that he quit smoking about 73 years ago. His smoking use included Cigarettes. He smoked 0.00 packs per day. He has never used smokeless tobacco. He reports that he does not drink alcohol or use illicit drugs.   Family History:  The patient's family history includes Heart disease in his brother; Stroke in his brother.   ROS:  Please see the history of present illness.      All other systems reviewed and negative.   PHYSICAL EXAM: VS:  BP 76/43  Pulse 92  Ht 5\' 9"  (1.753 m)  Wt 216 lb (97.977 kg)  BMI 31.88 kg/m2 Well nourished, well developed, in no acute distress HEENT: normal Neck: no JVD Cardiac:  normal S1, S2; irregularly irregular; no  murmur Lungs:  clear to auscultation bilaterally, no wheezing, rhonchi or rales Abd: soft, nontender, no hepatomegaly Ext: no edema Skin: warm and dry Neuro:  CNs 2-12 intact, no focal abnormalities noted  EKG:       ASSESSMENT AND PLAN:  1. Chronic diastolic CHF - last echo 09/2012 EF 45-50% - appears compensated  - continue Lasix/ACE I/beta blocker   - check BMET 2. Mild AS 3. HTN - controlled and actually borderline low this am and his weight is down  - continue Lisinopril/amlodipine/Toprol  - will lower dose of Lasix 4. Chronic AF with no reoccurence  - continue Toprol/ASA - felt not a coumadin candidate due to advanced age and debilitated state 5. ASCAD with no angina  - continue ASA/Imdur 6. dyslipdemia  - continue Niacin 7. Chronic LE edema - no edema on exam today  - decrease Lasix to 20mg  daily due to low BP 8.  Mild AS with mildly dilated aortic root  Followup with me in 3 months  Signed, Armanda Magic, MD 02/20/2013 11:40 AM

## 2013-02-21 ENCOUNTER — Telehealth: Payer: Self-pay | Admitting: General Surgery

## 2013-02-21 MED ORDER — POTASSIUM CHLORIDE CRYS ER 20 MEQ PO TBCR: 10.0000 meq | EXTENDED_RELEASE_TABLET | Freq: Every day | ORAL | Status: DC

## 2013-02-21 NOTE — Telephone Encounter (Signed)
Spoke to pts son. He is aware of med change and the bmet is scheduled for next weds the 19th and pts son is aware.

## 2013-02-21 NOTE — Telephone Encounter (Signed)
Message copied by Nita Sells on Thu Feb 21, 2013 12:30 PM ------      Message from: Armanda Magic R      Created: Thu Feb 21, 2013 12:12 PM       Lasix decreased at OV the same day this was taken.  Make sure he has followup BMET in 1 week.  Please have him decrease potassium to 10 meq daily ------

## 2013-02-22 NOTE — Progress Notes (Signed)
Patient ID: [name], male   DOB: [birth year], 79 y.o.   MRN: [id number]                PROGRESS NOTE  DATE: 02/08/2013   FACILITY: [facility]  LEVEL OF CARE: SNF (31)  Acute Visit  CHIEF COMPLAINT:  Discharge Notes  HISTORY OF PRESENT ILLNESS: This is a 79 year old male who is for discharge home with Home health PT, OT and Nursing. He has been admitted to [facility] on 12/26/12 from [facility] with principal diagnosis of CHF, systolic and diastolic.  Patient was admitted to this facility for short-term rehabilitation after the patient's recent hospitalization.  Patient has completed SNF rehabilitation and therapy has cleared the patient for discharge.  Reassessment of ongoing problem(s):  HTN: Pt 's HTN remains stable.  Denies CP, sob, DOE, pedal edema, headaches, dizziness or visual disturbances.  No complications from the medications currently being used.  Last BP : 144/60  DM:pt's DM remains stable.  Pt denies polyuria, polydipsia, polyphagia, changes in vision or hypoglycemic episodes.  No complications noted from the medication presently being used.    9/14 hemoglobin A1c 6.8  ATRIAL FIBRILLATION: the patients atrial fibrillation remains stable.  The patient denies DOE, tachycardia, orthopnea, transient neurological sx, palpitations, & PNDs.  No complications noted from the medications currently being used.  PAST MEDICAL HISTORY : Reviewed.  No changes.  CURRENT MEDICATIONS: Reviewed per [facility]  REVIEW OF SYSTEMS:  GENERAL: no change in appetite, no fatigue, no weight changes, no fever, chills or weakness RESPIRATORY: no cough, SOB, DOE, wheezing, hemoptysis CARDIAC: no chest pain, or palpitations, + edema GI: no abdominal pain, diarrhea, constipation, heart burn, nausea or vomiting  PHYSICAL EXAMINATION  VS:  T97.9       P90       RR18      BP144/60      POX96 %       WT144/60 (Lb)  GENERAL: no acute distress, normal body habitus EYES:  conjunctivae normal, sclerae normal, normal eye lids NECK: supple, trachea midline, no neck masses, no thyroid tenderness, no thyromegaly LYMPHATICS: no LAN in the neck, no supraclavicular LAN RESPIRATORY: breathing is even & unlabored, BS CTAB CARDIAC: RRR, no murmur,no extra heart sounds, BLE edema, 1+ GI: abdomen soft, normal BS, no masses, no tenderness, no hepatomegaly, no splenomegaly PSYCHIATRIC: the patient is alert & oriented to person, affect & behavior appropriate  LABS/RADIOLOGY: 01/15/13 BNP 71 01/10/13 sodium 136 potassium or 0.6 glucose 128 BUN 43 creatinine 1.9 calcium 9.1 01/03/13 WBC 9.2 hemoglobin 10.8 hematocrit 34.4 MCV 100.3 sodium 139 potassium 4.7 glucose 128 BUN 43 creatinine 1.8 calcium 9.4 12/22/12  Wbc 5.6  hgb 9.5  hct 28.4  NA 139  K2.8  Glucose 138  BUN 14  Creatinine 1.30  Calcium 8.8 hgbA1c 6.8   ASSESSMENT/PLAN:  Chronic Diastolic Heart Failure - stable  Hypertension - well-controlled  Atrial Fibrillation - rate-controlled  Hypokalemia - continue K supplementation  Type 2 Diabetes Mellitus with Renal Manifestations - well-controlled  Anemia - stable; continue Ferrex  Peripheral Vascular Disease -  Continue Pletal   I have filled out patient's discharge paperwork and written prescriptions.  Patient will receive home health PT, OT and Nursing.   Total discharge time: Greater than 30 minutes Discharge time involved coordination of the discharge process with social worker, nursing staff and therapy department. Medical justification for home health services verified.  CPT CODE: 98119

## 2013-02-26 ENCOUNTER — Other Ambulatory Visit: Payer: Self-pay | Admitting: Adult Health

## 2013-02-27 ENCOUNTER — Other Ambulatory Visit: Payer: PRIVATE HEALTH INSURANCE

## 2013-02-28 ENCOUNTER — Encounter: Payer: Self-pay | Admitting: Vascular Surgery

## 2013-03-01 ENCOUNTER — Ambulatory Visit (INDEPENDENT_AMBULATORY_CARE_PROVIDER_SITE_OTHER): Payer: Medicare HMO | Admitting: Vascular Surgery

## 2013-03-01 ENCOUNTER — Encounter: Payer: Self-pay | Admitting: Vascular Surgery

## 2013-03-01 VITALS — BP 137/123 | HR 135 | Ht 69.0 in | Wt 216.0 lb

## 2013-03-01 DIAGNOSIS — L97509 Non-pressure chronic ulcer of other part of unspecified foot with unspecified severity: Secondary | ICD-10-CM | POA: Insufficient documentation

## 2013-03-01 DIAGNOSIS — I7025 Atherosclerosis of native arteries of other extremities with ulceration: Secondary | ICD-10-CM | POA: Insufficient documentation

## 2013-03-01 DIAGNOSIS — I739 Peripheral vascular disease, unspecified: Secondary | ICD-10-CM

## 2013-03-01 NOTE — Progress Notes (Signed)
[facility] [place]  Postoperative Visit  History of Present Illness  [name] is a 79 y.o. male who presents for postoperative follow-up from procedure on Date: 01/31/13 LRo.  The patient's wounds are stable: scab on unhealed nail bed, small dried ulcer on 2nd toe tip.  The patient notes stable resolution of lower extremity symptoms.  The patient continues to be unable to complete their activities of daily living without resistance.  The patient's current symptoms are: left foot pain intermittently at rest and non-healing of L great toe nail.  Pt was seen by his cardiologist recently and has intact EF 45-50%.  Past Medical History, Past Surgical History, Social History, Family History, Medications, Allergies, and Review of Systems are unchanged from previous evaluation on 01/31/13.  For VQI Use Only  PRE-ADM LIVING: Home  AMB STATUS: Wheelchair  Physical Examination  Filed Vitals:   03/01/13 0916  BP: 137/123  Pulse: 135  Height: 5\' 9"  (1.753 m)  Weight: 216 lb (97.977 kg)  SpO2: 100%   Body mass index is 31.88 kg/(m^2).  General: A&O x 3, WD, elderly, ill appearing  Pulmonary: Sym exp, good air movt, CTAB, no rales, rhonchi, & wheezing  Cardiac: RRR, Nl S1, S2, no Murmurs, rubs or gallops  Vascular:  No palpable pedal pulses B  Gastrointestinal: soft, NTND, -G/R, - HSM, - masses, - CVAT B  Musculoskeletal: M/S 5/5 throughout , Both feet with some dependent rubor, L great toe with no nail, some dry eschar over proximal nail bed, small punctate shallow ulcer over distal left 2nd toe  Neurologic:  Pain and light touch intact in extremities , Motor exam as listed above  Medical Decision Making  [name] is a 79 y.o. male who presents s/p diagnostic L leg angiogram. Based on his angiographic findings, this patient needs: primarily medical management.  He has 3 vessel tibial occlusion that could not be intervened upon due to his inability  to cooperate in the angiography suite. He is not a surgical revascularization candidate. If his limited wounds worsen, I would consider attempt at intervention of L peroneal artery occlusion in the hybrid OR with either General anesthesia or heavy MAC. On exam, he had no evidence of cellulitis and no pain with manipulating his left foot, so I don't think anything immediately is necessary. If gave the caregiver some local wound instructions: wash the feet BID with soap and water.  Keep both feet protected in socks and shoes. I discussed in depth with the patient the nature of atherosclerosis, and emphasized the importance of maximal medical management including strict control of blood pressure, blood glucose, and lipid levels, obtaining regular exercise, and cessation of smoking.  The patient is aware that without maximal medical management the underlying atherosclerotic disease process will progress, limiting the benefit of any interventions. The patient is currently  on an anti-platelet: ASA. His cardiologist doesn't think he would be safe on Coumadin.  I would tend to agree with his debilitated state.  Thank you for allowing [place] to participate in this patient's care.  Leonides Sake, MD [facility] [place] Office: [phone] Pager: [phone]

## 2013-03-05 ENCOUNTER — Other Ambulatory Visit: Payer: Self-pay | Admitting: Adult Health

## 2013-03-14 ENCOUNTER — Ambulatory Visit
Admission: RE | Admit: 2013-03-14 | Discharge: 2013-03-14 | Disposition: A | Discharge status: Home / Self Care | Payer: Commercial Managed Care - HMO | Source: Ambulatory Visit | Attending: Internal Medicine | Admitting: Internal Medicine

## 2013-03-14 ENCOUNTER — Other Ambulatory Visit: Payer: Self-pay | Admitting: Internal Medicine

## 2013-03-14 DIAGNOSIS — J209 Acute bronchitis, unspecified: Secondary | ICD-10-CM

## 2013-03-22 ENCOUNTER — Telehealth: Payer: Self-pay | Admitting: *Deleted

## 2013-03-22 NOTE — Telephone Encounter (Signed)
Patient's son and daughter in law called to request a refill of Voltaren gel for Mr. Grape. I told them that I could not see that we had ever Rx'd this in the past. They said the last came from patient's PCP, Dr. Valentina Lucks. I told them to contact that office for this refill and if Dr. Valentina Lucks had any questions he can call me directly.

## 2013-03-30 ENCOUNTER — Other Ambulatory Visit: Payer: Self-pay | Admitting: Adult Health

## 2013-04-02 ENCOUNTER — Telehealth: Payer: Self-pay | Admitting: *Deleted

## 2013-04-02 NOTE — Telephone Encounter (Signed)
Daughter- in- law, Elmarie Shiley, called to report that Mr. [name] foot has gotten a lot worse since we saw him in November. She reports that his toes are black  and the skin of the foot is bright red; this is malodorous. Patient is in a lot of pain and has had some fever. I suggested that she take him to the [facility] ED to be evaluated considering his frail health and cardiac conditions. She voiced agreement of this plan.

## 2013-04-10 ENCOUNTER — Inpatient Hospital Stay (HOSPITAL_COMMUNITY)
Admission: EM | Admit: 2013-04-10 | Discharge: 2013-04-17 | DRG: 690 | Disposition: A | Discharge status: Skilled Nursing Facility | Payer: Medicare HMO | Attending: Internal Medicine | Admitting: Internal Medicine

## 2013-04-10 ENCOUNTER — Emergency Department (HOSPITAL_COMMUNITY): Payer: Medicare HMO

## 2013-04-10 ENCOUNTER — Encounter (HOSPITAL_COMMUNITY): Payer: Self-pay | Admitting: Emergency Medicine

## 2013-04-10 DIAGNOSIS — H3530 Unspecified macular degeneration: Secondary | ICD-10-CM | POA: Diagnosis present

## 2013-04-10 DIAGNOSIS — E119 Type 2 diabetes mellitus without complications: Secondary | ICD-10-CM | POA: Diagnosis present

## 2013-04-10 DIAGNOSIS — I5042 Chronic combined systolic (congestive) and diastolic (congestive) heart failure: Secondary | ICD-10-CM | POA: Diagnosis present

## 2013-04-10 DIAGNOSIS — Z79899 Other long term (current) drug therapy: Secondary | ICD-10-CM

## 2013-04-10 DIAGNOSIS — I5032 Chronic diastolic (congestive) heart failure: Secondary | ICD-10-CM | POA: Diagnosis present

## 2013-04-10 DIAGNOSIS — Z882 Allergy status to sulfonamides status: Secondary | ICD-10-CM

## 2013-04-10 DIAGNOSIS — I509 Heart failure, unspecified: Secondary | ICD-10-CM | POA: Diagnosis present

## 2013-04-10 DIAGNOSIS — I129 Hypertensive chronic kidney disease with stage 1 through stage 4 chronic kidney disease, or unspecified chronic kidney disease: Secondary | ICD-10-CM | POA: Diagnosis present

## 2013-04-10 DIAGNOSIS — E785 Hyperlipidemia, unspecified: Secondary | ICD-10-CM | POA: Diagnosis present

## 2013-04-10 DIAGNOSIS — R42 Dizziness and giddiness: Secondary | ICD-10-CM | POA: Diagnosis not present

## 2013-04-10 DIAGNOSIS — D696 Thrombocytopenia, unspecified: Secondary | ICD-10-CM | POA: Diagnosis not present

## 2013-04-10 DIAGNOSIS — Z823 Family history of stroke: Secondary | ICD-10-CM

## 2013-04-10 DIAGNOSIS — I4891 Unspecified atrial fibrillation: Secondary | ICD-10-CM | POA: Diagnosis present

## 2013-04-10 DIAGNOSIS — Z8249 Family history of ischemic heart disease and other diseases of the circulatory system: Secondary | ICD-10-CM

## 2013-04-10 DIAGNOSIS — D631 Anemia in chronic kidney disease: Secondary | ICD-10-CM | POA: Diagnosis present

## 2013-04-10 DIAGNOSIS — I10 Essential (primary) hypertension: Secondary | ICD-10-CM | POA: Diagnosis present

## 2013-04-10 DIAGNOSIS — I2510 Atherosclerotic heart disease of native coronary artery without angina pectoris: Secondary | ICD-10-CM | POA: Diagnosis present

## 2013-04-10 DIAGNOSIS — D649 Anemia, unspecified: Secondary | ICD-10-CM | POA: Diagnosis present

## 2013-04-10 DIAGNOSIS — Z8673 Personal history of transient ischemic attack (TIA), and cerebral infarction without residual deficits: Secondary | ICD-10-CM

## 2013-04-10 DIAGNOSIS — Z7982 Long term (current) use of aspirin: Secondary | ICD-10-CM

## 2013-04-10 DIAGNOSIS — N390 Urinary tract infection, site not specified: Principal | ICD-10-CM | POA: Diagnosis present

## 2013-04-10 DIAGNOSIS — I739 Peripheral vascular disease, unspecified: Secondary | ICD-10-CM | POA: Diagnosis present

## 2013-04-10 DIAGNOSIS — Z87891 Personal history of nicotine dependence: Secondary | ICD-10-CM

## 2013-04-10 DIAGNOSIS — N189 Chronic kidney disease, unspecified: Secondary | ICD-10-CM | POA: Diagnosis present

## 2013-04-10 DIAGNOSIS — Z880 Allergy status to penicillin: Secondary | ICD-10-CM

## 2013-04-10 DIAGNOSIS — E1129 Type 2 diabetes mellitus with other diabetic kidney complication: Secondary | ICD-10-CM | POA: Diagnosis present

## 2013-04-10 DIAGNOSIS — R4182 Altered mental status, unspecified: Secondary | ICD-10-CM

## 2013-04-10 DIAGNOSIS — E876 Hypokalemia: Secondary | ICD-10-CM | POA: Diagnosis present

## 2013-04-10 DIAGNOSIS — N183 Chronic kidney disease, stage 3 unspecified: Secondary | ICD-10-CM | POA: Diagnosis present

## 2013-04-10 DIAGNOSIS — N058 Unspecified nephritic syndrome with other morphologic changes: Secondary | ICD-10-CM | POA: Diagnosis present

## 2013-04-10 DIAGNOSIS — Z9861 Coronary angioplasty status: Secondary | ICD-10-CM

## 2013-04-10 DIAGNOSIS — R296 Repeated falls: Secondary | ICD-10-CM

## 2013-04-10 DIAGNOSIS — N179 Acute kidney failure, unspecified: Secondary | ICD-10-CM | POA: Diagnosis present

## 2013-04-10 HISTORY — DX: Altered mental status, unspecified: R41.82

## 2013-04-10 HISTORY — DX: Malignant neoplasm of bladder, unspecified: C67.9

## 2013-04-10 LAB — CBC WITH DIFFERENTIAL/PLATELET
Basophils Absolute: 0 10*3/uL (ref 0.0–0.1)
Basophils Relative: 0 % (ref 0–1)
Eosinophils Absolute: 0.2 10*3/uL (ref 0.0–0.7)
Hemoglobin: 11.3 g/dL — ABNORMAL LOW (ref 13.0–17.0)
MCH: 33.9 pg (ref 26.0–34.0)
MCHC: 34 g/dL (ref 30.0–36.0)
Monocytes Relative: 8 % (ref 3–12)
Neutro Abs: 6.1 10*3/uL (ref 1.7–7.7)
Neutrophils Relative %: 76 % (ref 43–77)
Platelets: ADEQUATE 10*3/uL (ref 150–400)
RDW: 16.4 % — ABNORMAL HIGH (ref 11.5–15.5)
Smear Review: ADEQUATE
WBC: 8 10*3/uL (ref 4.0–10.5)

## 2013-04-10 LAB — URINALYSIS, ROUTINE W REFLEX MICROSCOPIC
Bilirubin Urine: NEGATIVE
Ketones, ur: NEGATIVE mg/dL
Nitrite: NEGATIVE
Protein, ur: 30 mg/dL — AB
Urobilinogen, UA: 1 mg/dL (ref 0.0–1.0)
pH: 5 (ref 5.0–8.0)

## 2013-04-10 LAB — GLUCOSE, CAPILLARY: Glucose-Capillary: 180 mg/dL — ABNORMAL HIGH (ref 70–99)

## 2013-04-10 LAB — POCT I-STAT, CHEM 8
BUN: 38 mg/dL — ABNORMAL HIGH (ref 6–23)
Calcium, Ion: 1.22 mmol/L (ref 1.13–1.30)
Creatinine, Ser: 1.8 mg/dL — ABNORMAL HIGH (ref 0.50–1.35)
Glucose, Bld: 186 mg/dL — ABNORMAL HIGH (ref 70–99)
Hemoglobin: 11.2 g/dL — ABNORMAL LOW (ref 13.0–17.0)
Sodium: 137 mEq/L (ref 137–147)
TCO2: 26 mmol/L (ref 0–100)

## 2013-04-10 LAB — URINE MICROSCOPIC-ADD ON

## 2013-04-10 LAB — POCT I-STAT TROPONIN I: Troponin i, poc: 0.04 ng/mL (ref 0.00–0.08)

## 2013-04-10 MED ORDER — ISOSORBIDE MONONITRATE ER 30 MG PO TB24
30.0000 mg | ORAL_TABLET | Freq: Every day | ORAL | Status: DC
  Administered 2013-04-11 – 2013-04-17 (×7): 30 mg via ORAL
  Filled 2013-04-10 (×7): qty 1

## 2013-04-10 MED ORDER — AMLODIPINE BESYLATE 10 MG PO TABS
10.0000 mg | ORAL_TABLET | Freq: Every day | ORAL | Status: DC
  Administered 2013-04-11 – 2013-04-12 (×2): 10 mg via ORAL
  Filled 2013-04-10 (×2): qty 1

## 2013-04-10 MED ORDER — NIACIN 500 MG PO TABS
500.0000 mg | ORAL_TABLET | Freq: Every day | ORAL | Status: DC
  Administered 2013-04-10 – 2013-04-15 (×6): 500 mg via ORAL
  Filled 2013-04-10 (×8): qty 1

## 2013-04-10 MED ORDER — INSULIN ASPART 100 UNIT/ML ~~LOC~~ SOLN
0.0000 [IU] | Freq: Three times a day (TID) | SUBCUTANEOUS | Status: DC
  Administered 2013-04-11 (×2): 1 [IU] via SUBCUTANEOUS
  Administered 2013-04-11: 2 [IU] via SUBCUTANEOUS
  Administered 2013-04-12: 1 [IU] via SUBCUTANEOUS
  Administered 2013-04-12: 2 [IU] via SUBCUTANEOUS
  Administered 2013-04-13: 1 [IU] via SUBCUTANEOUS
  Administered 2013-04-13 – 2013-04-14 (×3): 2 [IU] via SUBCUTANEOUS
  Administered 2013-04-15 – 2013-04-16 (×2): 1 [IU] via SUBCUTANEOUS

## 2013-04-10 MED ORDER — HEPARIN SODIUM (PORCINE) 5000 UNIT/ML IJ SOLN
5000.0000 [IU] | Freq: Three times a day (TID) | INTRAMUSCULAR | Status: DC
  Administered 2013-04-10 – 2013-04-12 (×6): 5000 [IU] via SUBCUTANEOUS
  Filled 2013-04-10 (×9): qty 1

## 2013-04-10 MED ORDER — FUROSEMIDE 40 MG PO TABS
40.0000 mg | ORAL_TABLET | Freq: Two times a day (BID) | ORAL | Status: DC
  Administered 2013-04-10 – 2013-04-13 (×7): 40 mg via ORAL
  Filled 2013-04-10 (×10): qty 1

## 2013-04-10 MED ORDER — SODIUM CHLORIDE 0.9 % IV BOLUS (SEPSIS)
1000.0000 mL | Freq: Once | INTRAVENOUS | Status: AC
  Administered 2013-04-10: 1000 mL via INTRAVENOUS

## 2013-04-10 MED ORDER — LISINOPRIL 20 MG PO TABS
20.0000 mg | ORAL_TABLET | Freq: Every day | ORAL | Status: DC
  Administered 2013-04-11 – 2013-04-13 (×3): 20 mg via ORAL
  Filled 2013-04-10 (×5): qty 1

## 2013-04-10 MED ORDER — CIPROFLOXACIN IN D5W 400 MG/200ML IV SOLN
400.0000 mg | Freq: Once | INTRAVENOUS | Status: DC
  Administered 2013-04-10: 400 mg via INTRAVENOUS
  Filled 2013-04-10: qty 200

## 2013-04-10 MED ORDER — CYCLOBENZAPRINE HCL 5 MG PO TABS
5.0000 mg | ORAL_TABLET | Freq: Every day | ORAL | Status: DC
  Administered 2013-04-10 – 2013-04-15 (×6): 5 mg via ORAL
  Filled 2013-04-10 (×8): qty 1

## 2013-04-10 MED ORDER — METOPROLOL SUCCINATE ER 25 MG PO TB24
25.0000 mg | ORAL_TABLET | Freq: Every day | ORAL | Status: DC
  Administered 2013-04-11 – 2013-04-12 (×2): 25 mg via ORAL
  Filled 2013-04-10 (×3): qty 1

## 2013-04-10 MED ORDER — HYDRALAZINE HCL 20 MG/ML IJ SOLN
5.0000 mg | Freq: Three times a day (TID) | INTRAMUSCULAR | Status: DC | PRN
  Administered 2013-04-10: 5 mg via INTRAVENOUS
  Filled 2013-04-10: qty 1

## 2013-04-10 MED ORDER — ALBUTEROL SULFATE HFA 108 (90 BASE) MCG/ACT IN AERS
2.0000 | INHALATION_SPRAY | Freq: Four times a day (QID) | RESPIRATORY_TRACT | Status: DC | PRN
  Filled 2013-04-10: qty 6.7

## 2013-04-10 MED ORDER — SODIUM CHLORIDE 0.9 % IV SOLN
250.0000 mg | Freq: Four times a day (QID) | INTRAVENOUS | Status: DC
  Administered 2013-04-10 – 2013-04-11 (×3): 250 mg via INTRAVENOUS
  Filled 2013-04-10 (×7): qty 250

## 2013-04-10 MED ORDER — SODIUM CHLORIDE 0.9 % IV SOLN
INTRAVENOUS | Status: DC
  Administered 2013-04-10: 125 mL/h via INTRAVENOUS

## 2013-04-10 MED ORDER — ASPIRIN EC 81 MG PO TBEC
81.0000 mg | DELAYED_RELEASE_TABLET | Freq: Every day | ORAL | Status: DC
  Administered 2013-04-11 – 2013-04-17 (×7): 81 mg via ORAL
  Filled 2013-04-10 (×7): qty 1

## 2013-04-10 MED ORDER — SODIUM CHLORIDE 0.9 % IJ SOLN
3.0000 mL | Freq: Two times a day (BID) | INTRAMUSCULAR | Status: DC
  Administered 2013-04-10 – 2013-04-15 (×10): 3 mL via INTRAVENOUS
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
## 2013-04-10 NOTE — ED Provider Notes (Signed)
Patient fell in bathroom 2 days ago. No known injury. His caregiver reports today that he was too weak to stand up from the toilet. He's had a slight cough over the past few days. No fever. No other complaint. On exam chronically ill-appearing HEENT exam normocephalic atraumatic neck supple lungs clear auscultation abdomen nondistended nontender all 4 extremities no contusion abrasion or tenderness neurovascularly intact. Patient becomes lightheaded upon pending to stand. He  is unable to stand due to generalized weakness.   Date: 04/10/2013  Rate: 65  Rhythm: Atrial paced rhythm possible ectopic atrial pacemaker  QRS Axis: left  Intervals: normal  ST/T Wave abnormalities: nonspecific T wave changes  Conduction Disutrbances:none  Narrative Interpretation:   Old EKG Reviewed: No significant change from 12/21/2012 interpreted by me   Doug Sou, MD 04/10/13 2051

## 2013-04-10 NOTE — ED Notes (Signed)
Attempted to straight cath pt for urinalysis. Unable to fully retract foreskin. Placed condom catheter on patient. For urine collection.

## 2013-04-10 NOTE — ED Provider Notes (Signed)
Medical screening examination/treatment/procedure(s) were performed by non-physician practitioner and as supervising physician I was immediately available for consultation/collaboration.  EKG Interpretation    Date/Time:  Wednesday April 10 2013 11:45:02 EST Ventricular Rate:  66 PR Interval:  156 QRS Duration: 99 QT Interval:  474 QTC Calculation: 497 R Axis:   -21 Text Interpretation:  Ectopic atrial rhythm Multiple premature complexes, vent  Borderline left axis deviation Since last tracing rate slower Confirmed by Ethelda Chick  MD, Patsey Pitstick ([id number]) on 04/10/2013 12:48:26 PM             Doug Sou, MD 04/10/13 2052

## 2013-04-10 NOTE — ED Notes (Signed)
Correction. I examined and interviewed this patient. Medical screening examination/treatment/procedure(s) were conducted as a shared visit with non-physician practitioner(s) and myself.  I personally evaluated the patient during the encounter.  EKG Interpretation    Date/Time:  Wednesday April 10 2013 11:45:02 EST Ventricular Rate:  66 PR Interval:  156 QRS Duration: 99 QT Interval:  474 QTC Calculation: 497 R Axis:   -21 Text Interpretation:  Ectopic atrial rhythm Multiple premature complexes, vent  Borderline left axis deviation Since last tracing rate slower Confirmed by Ethelda Chick  MD, Teliyah Royal ([id number]) on 04/10/2013 12:48:26 PM             Doug Sou, MD 04/10/13 2055

## 2013-04-10 NOTE — ED Notes (Signed)
Lactic acid results called to primary nurse Nikki 

## 2013-04-10 NOTE — Progress Notes (Signed)
ANTIBIOTIC CONSULT NOTE - INITIAL  Pharmacy Consult for Primaxin Indication: UTI  Allergies  Allergen Reactions  . Penicillins Swelling  . Sulfa Antibiotics Other (See Comments)    unknown    Patient Measurements: Weight: 215 lb (97.523 kg)   Vital Signs: Temp: 97.6 F (36.4 C) (12/31 1233) Temp src: Rectal (12/31 1233) BP: 194/71 mmHg (12/31 1515) Pulse Rate: 99 (12/31 1515) Intake/Output from previous day:   Intake/Output from this shift:    Labs:  Recent Labs  04/10/13 1220 04/10/13 1224  WBC 8.0  --   HGB 11.3* 11.2*  PLT PLATELET CLUMPS NOTED ON SMEAR, COUNT APPEARS ADEQUATE  --   CREATININE  --  1.80*   The CrCl is unknown because both a height and weight (above a minimum accepted value) are required for this calculation. No results found for this basename: VANCOTROUGH, VANCOPEAK, VANCORANDOM, GENTTROUGH, GENTPEAK, GENTRANDOM, TOBRATROUGH, TOBRAPEAK, TOBRARND, AMIKACINPEAK, AMIKACINTROU, AMIKACIN,  in the last 72 hours   Microbiology: No results found for this or any previous visit (from the past 720 hour(s)).  Medical History: Past Medical History  Diagnosis Date  . Macular degeneration   . Bladder tumor 1998  . Cancer 2001    bladder  . Peripheral vascular disease   . Anemia   . TIA (transient ischemic attack)   . Low back pain   . Pneumonia Sept. 2014  . Chronic kidney disease     Stage III  . UTI (lower urinary tract infection)   . Coronary artery disease 02/2005    PCI with DES of RCA and LAD with normal LVF  . Diabetes mellitus   . Hypertension   . Chronic atrial fibrillation   . Carotid artery occlusion     s/p right CEA  . Hyperlipidemia   . Edema extremities   . CHF (congestive heart failure) Sept. 2014    EF 45-50%    Medications:  See med rec  Assessment: Patient is a 79 y.o M s/p fall yesteday who presents to the ED today with AMS.  To start broad coverage with primaxin for suspected UTI.  Cipro 400mg  IV x1 given in the ED  at 1525. Of note, patient had Klebsiella UTI back in September that was resistant to Rocephin and intermediate to floroquinolones.  PCN listed as an allergy with swelling noted as the reaction.  She received Rocephin back in July 2014 and no ADE documented in [facility].. Scr 1.80 (crcl~26)   Plan:  1) Primaxin 250mg  IV q6h 2) Will monitor closely for ADE 3) f/u renal function and cx  Lorilynn Lehr P 04/10/2013,4:17 PM

## 2013-04-10 NOTE — ED Notes (Signed)
Family reports pt lives at home with family, he wasn't acting normal self yesterday and slept most of the day, having urinary incontinence and fell yesterday trying to get up to restroom, unsure if he hit his head. Family thinks pt has UTI.

## 2013-04-11 LAB — GLUCOSE, CAPILLARY
GLUCOSE-CAPILLARY: 139 mg/dL — AB (ref 70–99)
GLUCOSE-CAPILLARY: 142 mg/dL — AB (ref 70–99)
Glucose-Capillary: 157 mg/dL — ABNORMAL HIGH (ref 70–99)
Glucose-Capillary: 141 mg/dL — ABNORMAL HIGH (ref 70–99)

## 2013-04-11 LAB — CBC
HCT: 27.1 % — ABNORMAL LOW (ref 39.0–52.0)
Hemoglobin: 9.1 g/dL — ABNORMAL LOW (ref 13.0–17.0)
MCH: 33.3 pg (ref 26.0–34.0)
MCHC: 33.6 g/dL (ref 30.0–36.0)
MCV: 99.3 fL (ref 78.0–100.0)
Platelets: 89 10*3/uL — ABNORMAL LOW (ref 150–400)
RBC: 2.73 MIL/uL — ABNORMAL LOW (ref 4.22–5.81)
RDW: 16.4 % — ABNORMAL HIGH (ref 11.5–15.5)
WBC: 5.2 10*3/uL (ref 4.0–10.5)

## 2013-04-11 LAB — BASIC METABOLIC PANEL
BUN: 31 mg/dL — ABNORMAL HIGH (ref 6–23)
CO2: 25 mEq/L (ref 19–32)
Calcium: 8.4 mg/dL (ref 8.4–10.5)
Chloride: 101 mEq/L (ref 96–112)
Creatinine, Ser: 1.68 mg/dL — ABNORMAL HIGH (ref 0.50–1.35)
GFR calc Af Amer: 39 mL/min — ABNORMAL LOW (ref >90)
GFR calc non Af Amer: 33 mL/min — ABNORMAL LOW (ref >90)
Glucose, Bld: 125 mg/dL — ABNORMAL HIGH (ref 70–99)
Potassium: 3.8 mEq/L (ref 3.7–5.3)
Sodium: 137 mEq/L (ref 137–147)

## 2013-04-11 MED ORDER — POTASSIUM CHLORIDE CRYS ER 20 MEQ PO TBCR
20.0000 meq | EXTENDED_RELEASE_TABLET | Freq: Two times a day (BID) | ORAL | Status: AC
  Administered 2013-04-11 (×2): 20 meq via ORAL
  Filled 2013-04-11 (×2): qty 1

## 2013-04-11 MED ORDER — LEVOFLOXACIN 500 MG PO TABS
500.0000 mg | ORAL_TABLET | Freq: Every day | ORAL | Status: DC
  Administered 2013-04-11 – 2013-04-12 (×2): 500 mg via ORAL
  Filled 2013-04-11 (×3): qty 1

## 2013-04-11 NOTE — Progress Notes (Signed)
Subjective: [name] feels much better today and is conversive. He states that he is always only able to get about in a wheelchair and usually does not walk. Will have PT OT assessment today to see how he does. Feels much stronger than he did yesterday. We'll transition over to oral Levaquin today and hopefully he can be discharged home tomorrow. He lives with his son  Objective: Weight change:  No intake or output data in the 24 hours ending 04/11/13 1003 Filed Vitals:   04/10/13 1515 04/10/13 1739 04/10/13 2005 04/11/13 0439  BP: 194/71 149/95 129/68 144/57  Pulse: 99 52 68 60  Temp:  97.8 F (36.6 C) 98.3 F (36.8 C) 97.6 F (36.4 C)  TempSrc:  Oral Oral Oral  Resp: $Remo'14 18 18 16  'ZILef$ Height:  $Remove'5\' 9"'XMdVDrh$  (1.753 m)    Weight:  93.3 kg (205 lb 11 oz)    SpO2: 97% 100% 100% 97%    General Appearance: Alert, cooperative, no distress, appears stated age Lungs: Clear to auscultation bilaterally, respirations unlabored Heart: Regular rate and rhythm, S1 and S2 normal, no murmur, rub or gallop Abdomen: Soft, non-tender, bowel sounds active all four quadrants, no masses, no organomegaly Extremities: Extremities normal, atraumatic, no cyanosis or edema Neuro: Oriented x2, name and place, nonfocal  Lab Results: Results for orders placed during the hospital encounter of 04/10/13 (from the past 48 hour(s))  GLUCOSE, CAPILLARY     Status: Abnormal   Collection Time    04/10/13 11:49 AM      Result Value Range   Glucose-Capillary 180 (*) 70 - 99 mg/dL   Comment 1 Notify RN    CBC WITH DIFFERENTIAL     Status: Abnormal   Collection Time    04/10/13 12:20 PM      Result Value Range   WBC 8.0  4.0 - 10.5 K/uL   Comment: WHITE COUNT CONFIRMED ON SMEAR   RBC 3.33 (*) 4.22 - 5.81 MIL/uL   Hemoglobin 11.3 (*) 13.0 - 17.0 g/dL   HCT 33.2 (*) 39.0 - 52.0 %   MCV 99.7  78.0 - 100.0 fL   MCH 33.9  26.0 - 34.0 pg   MCHC 34.0  30.0 - 36.0 g/dL   RDW 16.4 (*) 11.5 - 15.5 %   Platelets    150 - 400  K/uL   Value: PLATELET CLUMPS NOTED ON SMEAR, COUNT APPEARS ADEQUATE   Neutrophils Relative % 76  43 - 77 %   Neutro Abs 6.1  1.7 - 7.7 K/uL   Lymphocytes Relative 14  12 - 46 %   Lymphs Abs 1.1  0.7 - 4.0 K/uL   Monocytes Relative 8  3 - 12 %   Monocytes Absolute 0.6  0.1 - 1.0 K/uL   Eosinophils Relative 2  0 - 5 %   Eosinophils Absolute 0.2  0.0 - 0.7 K/uL   Basophils Relative 0  0 - 1 %   Basophils Absolute 0.0  0.0 - 0.1 K/uL   Smear Review       Value: PLATELET CLUMPS NOTED ON SMEAR, COUNT APPEARS ADEQUATE  POCT I-STAT TROPONIN I     Status: None   Collection Time    04/10/13 12:22 PM      Result Value Range   Troponin i, poc 0.04  0.00 - 0.08 ng/mL   Comment 3            Comment: Due to the release kinetics of cTnI,  a negative result within the first hours     of the onset of symptoms does not rule out     myocardial infarction with certainty.     If myocardial infarction is still suspected,     repeat the test at appropriate intervals.  POCT I-STAT, CHEM 8     Status: Abnormal   Collection Time    04/10/13 12:24 PM      Result Value Range   Sodium 137  137 - 147 mEq/L   Potassium 4.5  3.7 - 5.3 mEq/L   Chloride 101  96 - 112 mEq/L   BUN 38 (*) 6 - 23 mg/dL   Creatinine, Ser 1.80 (*) 0.50 - 1.35 mg/dL   Glucose, Bld 186 (*) 70 - 99 mg/dL   Calcium, Ion 1.22  1.13 - 1.30 mmol/L   TCO2 26  0 - 100 mmol/L   Hemoglobin 11.2 (*) 13.0 - 17.0 g/dL   HCT 33.0 (*) 39.0 - 52.0 %  URINALYSIS, ROUTINE W REFLEX MICROSCOPIC     Status: Abnormal   Collection Time    04/10/13  1:54 PM      Result Value Range   Color, Urine YELLOW  YELLOW   APPearance CLOUDY (*) CLEAR   Specific Gravity, Urine 1.017  1.005 - 1.030   pH 5.0  5.0 - 8.0   Glucose, UA NEGATIVE  NEGATIVE mg/dL   Hgb urine dipstick NEGATIVE  NEGATIVE   Bilirubin Urine NEGATIVE  NEGATIVE   Ketones, ur NEGATIVE  NEGATIVE mg/dL   Protein, ur 30 (*) NEGATIVE mg/dL   Urobilinogen, UA 1.0  0.0 - 1.0 mg/dL    Nitrite NEGATIVE  NEGATIVE   Leukocytes, UA MODERATE (*) NEGATIVE  URINE MICROSCOPIC-ADD ON     Status: Abnormal   Collection Time    04/10/13  1:54 PM      Result Value Range   Squamous Epithelial / LPF RARE  RARE   WBC, UA 21-50  <3 WBC/hpf   RBC / HPF 0-2  <3 RBC/hpf   Bacteria, UA MANY (*) RARE  OCCULT BLOOD, POC DEVICE     Status: None   Collection Time    04/10/13  2:08 PM      Result Value Range   Fecal Occult Bld NEGATIVE  NEGATIVE  CG4 I-STAT (LACTIC ACID)     Status: Abnormal   Collection Time    04/10/13  2:24 PM      Result Value Range   Lactic Acid, Venous 2.40 (*) 0.5 - 2.2 mmol/L  BASIC METABOLIC PANEL     Status: Abnormal   Collection Time    04/11/13  5:53 AM      Result Value Range   Sodium 137  137 - 147 mEq/L   Comment: Please note change in reference range.   Potassium 3.8  3.7 - 5.3 mEq/L   Comment: Please note change in reference range.   Chloride 101  96 - 112 mEq/L   CO2 25  19 - 32 mEq/L   Glucose, Bld 125 (*) 70 - 99 mg/dL   BUN 31 (*) 6 - 23 mg/dL   Creatinine, Ser 1.68 (*) 0.50 - 1.35 mg/dL   Calcium 8.4  8.4 - 10.5 mg/dL   GFR calc non Af Amer 33 (*) >90 mL/min   GFR calc Af Amer 39 (*) >90 mL/min   Comment: (NOTE)     The eGFR has been calculated using the CKD EPI equation.  This calculation has not been validated in all clinical situations.     eGFR's persistently <90 mL/min signify possible Chronic Kidney     Disease.  CBC     Status: Abnormal   Collection Time    04/11/13  5:53 AM      Result Value Range   WBC 5.2  4.0 - 10.5 K/uL   RBC 2.73 (*) 4.22 - 5.81 MIL/uL   Hemoglobin 9.1 (*) 13.0 - 17.0 g/dL   Comment: DELTA CHECK NOTED     REPEATED TO VERIFY   HCT 27.1 (*) 39.0 - 52.0 %   MCV 99.3  78.0 - 100.0 fL   MCH 33.3  26.0 - 34.0 pg   MCHC 33.6  30.0 - 36.0 g/dL   RDW 16.4 (*) 11.5 - 15.5 %   Platelets 89 (*) 150 - 400 K/uL   Comment: PLATELET COUNT CONFIRMED BY SMEAR     REPEATED TO VERIFY     SPECIMEN CHECKED FOR CLOTS      LARGE PLATELETS PRESENT  GLUCOSE, CAPILLARY     Status: Abnormal   Collection Time    04/11/13  6:48 AM      Result Value Range   Glucose-Capillary 139 (*) 70 - 99 mg/dL    Studies/Results: Dg Chest 2 View  04/10/2013   CLINICAL DATA:  Altered mental status.  EXAM: CHEST  2 VIEW  COMPARISON:  Two-view chest 03/14/2013.  FINDINGS: Diffuse interstitial edema has increased again. The heart is mildly enlarged. The lung volumes are low. Pleural plaques are again noted. No significant effusion is evident.  IMPRESSION: 1. Mild cardiomegaly with increased interstitial markings suggesting edema superimposed on chronic disease. 2. Stable pleural plaques.   Electronically Signed   By: Lawrence Santiago M.D.   On: 04/10/2013 13:13   Ct Head Wo Contrast  04/10/2013   CLINICAL DATA:  Fall.  Altered mental status.  EXAM: CT HEAD WITHOUT CONTRAST  CT CERVICAL SPINE WITHOUT CONTRAST  TECHNIQUE: Multidetector CT imaging of the head and cervical spine was performed following the standard protocol without intravenous contrast. Multiplanar CT image reconstructions of the cervical spine were also generated.  COMPARISON:  CT head without contrast 10/30/2012. CT cervical spine 04/17/2010  FINDINGS: CT HEAD FINDINGS  Moderate generalized atrophy is present. Is remote encephalomalacia over the high right frontal lobe. A remote infarct of the left occipital lobe is stable. Remote lacunar infarcts are present in the basal ganglia bilaterally, more prominent on the left. Ex vacuo dilation is more evident in the left ventricle is well.  No acute cortical infarct, hemorrhage, or mass lesion is present.  The paranasal sinuses and mastoid air cells are clear. The osseous skull is intact. No significant extracranial soft tissue injury is evident. Vascular calcifications are again noted within the cavernous carotid arteries bilaterally.  CT CERVICAL SPINE FINDINGS  The cervical spine is imaged from the skullbase through T2.  Multilevel endplate degenerative changes are similar to the prior exam. Alignment is unchanged. No acute fracture or traumatic subluxation is evident. A soft tissues demonstrate atherosclerotic calcifications within the carotid bifurcations bilaterally. There is mild heterogeneity of the thyroid without a discrete lesion.  IMPRESSION: 1. Stable atrophy and diffuse white matter disease. 2. Remote encephalomalacia of the high right frontal lobe and left occipital lobe is stable. 3. No acute intracranial abnormality or evidence for focal trauma. 4. Moderate spondylosis throughout the cervical spine without evidence for acute fracture or traumatic subluxation   Electronically Signed  By: Gennette Pac M.D.   On: 04/10/2013 13:29   Ct Cervical Spine Wo Contrast  04/10/2013   CLINICAL DATA:  Fall.  Altered mental status.  EXAM: CT HEAD WITHOUT CONTRAST  CT CERVICAL SPINE WITHOUT CONTRAST  TECHNIQUE: Multidetector CT imaging of the head and cervical spine was performed following the standard protocol without intravenous contrast. Multiplanar CT image reconstructions of the cervical spine were also generated.  COMPARISON:  CT head without contrast 10/30/2012. CT cervical spine 04/17/2010  FINDINGS: CT HEAD FINDINGS  Moderate generalized atrophy is present. Is remote encephalomalacia over the high right frontal lobe. A remote infarct of the left occipital lobe is stable. Remote lacunar infarcts are present in the basal ganglia bilaterally, more prominent on the left. Ex vacuo dilation is more evident in the left ventricle is well.  No acute cortical infarct, hemorrhage, or mass lesion is present.  The paranasal sinuses and mastoid air cells are clear. The osseous skull is intact. No significant extracranial soft tissue injury is evident. Vascular calcifications are again noted within the cavernous carotid arteries bilaterally.  CT CERVICAL SPINE FINDINGS  The cervical spine is imaged from the skullbase through T2.  Multilevel endplate degenerative changes are similar to the prior exam. Alignment is unchanged. No acute fracture or traumatic subluxation is evident. A soft tissues demonstrate atherosclerotic calcifications within the carotid bifurcations bilaterally. There is mild heterogeneity of the thyroid without a discrete lesion.  IMPRESSION: 1. Stable atrophy and diffuse white matter disease. 2. Remote encephalomalacia of the high right frontal lobe and left occipital lobe is stable. 3. No acute intracranial abnormality or evidence for focal trauma. 4. Moderate spondylosis throughout the cervical spine without evidence for acute fracture or traumatic subluxation   Electronically Signed   By: Gennette Pac M.D.   On: 04/10/2013 13:29   Medications: Scheduled Meds: . amLODipine  10 mg Oral Daily  . aspirin EC  81 mg Oral Daily  . cyclobenzaprine  5 mg Oral QHS  . furosemide  40 mg Oral BID  . heparin  5,000 Units Subcutaneous Q8H  . imipenem-cilastatin  250 mg Intravenous Q6H  . insulin aspart  0-9 Units Subcutaneous TID WC  . isosorbide mononitrate  30 mg Oral Daily  . levofloxacin  500 mg Oral Daily  . lisinopril  20 mg Oral Daily  . metoprolol succinate  25 mg Oral QPC breakfast  . niacin  500 mg Oral QHS  . potassium chloride  20 mEq Oral BID  . sodium chloride  3 mL Intravenous Q12H   Continuous Infusions:  PRN Meds:.albuterol, hydrALAZINE  Assessment/Plan: Principal Problem:   UTI (lower urinary tract infection) Active Problems:   Atrial fibrillation   T2DM (type 2 diabetes mellitus)   Hypertension   Anemia   Acute on chronic renal insufficiency   Type II or unspecified type diabetes mellitus with renal manifestations, not stated as uncontrolled(250.40)   PVD (peripheral vascular disease)   Chronic kidney disease, stage III (moderate)   Hyperlipidemia   Chronic diastolic heart failure  UTI - will transition over to Levaquin oral. Previous microbiology in September 2014 with  Klebsiella resistant to Ceftriaxone, Zosyn, intermediate to Levofloxacin, Cipro, sensitive to Nitrofurantoin and Bactrim. He is pen allergic and has allergies to sulfas. Avoid Nitrofurantoin given age/renal function. Followup on urine culture sensitivities.  Chronic combined systolic and diastolic heart failure - most recent 2D echo 11/2012 with EF 45-50%. S/p 1L fluids in the ED, hypertensive somewhat, will discontinue IVF and allow diet.  CXR with possible edema. Patient is comfortable on room air now.  CAD - continue home medications  HTN - continue home medications, PRN hydralazine for SBP > 180. DM - last HBA1C 12/2012 of 6.8. Continue SSI while in the hospital.  Acute on chronic renal failure - baseline Cr in the recent months ~ 1.6-1.8, even up to ~3 at times. Continue current dose of Lasix and check basic metabolic profile in a.m. Mild hypokalemia - treat with 2 doses of potassium today, 20 mg twice daily and check basic metabolic profile in a.m. PVD - stable, followed by vascular as an outpatient  A fib - rate controlled. Probably not candidate for anticoagulation given falls.  CAD - continue home medications  Diet: No concentrated sweet diet  Fluids: none  DVT Prophylaxis: heparin  Code Status: Full  Family Communication: son over the phone  Disposition Plan: inpatient - but hope for discharge home tomorrow    LOS: 1 day   Henrine Screws, MD 04/11/2013, 10:03 AM

## 2013-04-12 LAB — GLUCOSE, CAPILLARY
Glucose-Capillary: 122 mg/dL — ABNORMAL HIGH (ref 70–99)
Glucose-Capillary: 138 mg/dL — ABNORMAL HIGH (ref 70–99)
Glucose-Capillary: 182 mg/dL — ABNORMAL HIGH (ref 70–99)
Glucose-Capillary: 127 mg/dL — ABNORMAL HIGH (ref 70–99)

## 2013-04-12 LAB — CBC WITH DIFFERENTIAL/PLATELET
Basophils Absolute: 0 10*3/uL (ref 0.0–0.1)
Basophils Relative: 0 % (ref 0–1)
EOS ABS: 0 10*3/uL (ref 0.0–0.7)
Eosinophils Relative: 1 % (ref 0–5)
HCT: 30.6 % — ABNORMAL LOW (ref 39.0–52.0)
Hemoglobin: 10.1 g/dL — ABNORMAL LOW (ref 13.0–17.0)
LYMPHS ABS: 1.5 10*3/uL (ref 0.7–4.0)
LYMPHS PCT: 34 % (ref 12–46)
MCH: 33 pg (ref 26.0–34.0)
MCHC: 33 g/dL (ref 30.0–36.0)
MCV: 100 fL (ref 78.0–100.0)
Monocytes Absolute: 0.5 10*3/uL (ref 0.1–1.0)
Monocytes Relative: 12 % (ref 3–12)
NEUTROS PCT: 53 % (ref 43–77)
Neutro Abs: 2.4 10*3/uL (ref 1.7–7.7)
Platelets: 101 10*3/uL — ABNORMAL LOW (ref 150–400)
RBC: 3.06 MIL/uL — ABNORMAL LOW (ref 4.22–5.81)
RDW: 16.6 % — ABNORMAL HIGH (ref 11.5–15.5)
WBC: 4.5 10*3/uL (ref 4.0–10.5)

## 2013-04-12 LAB — BASIC METABOLIC PANEL
BUN: 33 mg/dL — ABNORMAL HIGH (ref 6–23)
CO2: 23 meq/L (ref 19–32)
Calcium: 8.7 mg/dL (ref 8.4–10.5)
Chloride: 103 mEq/L (ref 96–112)
Creatinine, Ser: 1.67 mg/dL — ABNORMAL HIGH (ref 0.50–1.35)
GFR calc Af Amer: 39 mL/min — ABNORMAL LOW (ref >90)
GFR calc non Af Amer: 34 mL/min — ABNORMAL LOW (ref >90)
GLUCOSE: 130 mg/dL — AB (ref 70–99)
POTASSIUM: 4.2 meq/L (ref 3.7–5.3)
Sodium: 142 mEq/L (ref 137–147)

## 2013-04-12 MED ORDER — LEVOFLOXACIN 500 MG PO TABS: 500.0000 mg | ORAL_TABLET | Freq: Every day | ORAL | Status: DC

## 2013-04-12 MED ORDER — LEVOFLOXACIN 500 MG PO TABS
500.0000 mg | ORAL_TABLET | ORAL | Status: DC
  Administered 2013-04-14 – 2013-04-16 (×2): 500 mg via ORAL
  Filled 2013-04-12 (×2): qty 1

## 2013-04-12 NOTE — Progress Notes (Signed)
Subjective: Feels better  Objective: Vital signs in last 24 hours: Temp:  [97.8 F (36.6 C)-99.1 F (37.3 C)] 98.1 F (36.7 C) (01/02 0621) Pulse Rate:  [55-115] 115 (01/02 0621) Resp:  [17-20] 17 (01/02 0621) BP: (122-167)/(40-95) 167/95 mmHg (01/02 0621) SpO2:  [94 %-100 %] 94 % (01/02 0621) Weight:  [76.5 kg (168 lb 10.4 oz)] 76.5 kg (168 lb 10.4 oz) (01/02 0621) Weight change: -21.023 kg (-46 lb 5.6 oz) Last BM Date: 04/11/13  Intake/Output from previous day: 01/01 0701 - 01/02 0700 In: 840 [P.O.:840] Out: 300 [Urine:300] Intake/Output this shift:    General appearance: alert and cooperative Resp: clear to auscultation bilaterally Cardio: irregularly irregular rhythm Extremities: extremities normal, atraumatic, no cyanosis or edema  Lab Results:  Recent Labs  04/11/13 0553 04/12/13 0550  WBC 5.2 4.5  HGB 9.1* 10.1*  HCT 27.1* 30.6*  PLT 89* 101*   BMET  Recent Labs  04/10/13 1224 04/11/13 0553  NA 137 137  K 4.5 3.8  CL 101 101  CO2  --  25  GLUCOSE 186* 125*  BUN 38* 31*  CREATININE 1.80* 1.68*  CALCIUM  --  8.4    Studies/Results: Dg Chest 2 View  04/10/2013   CLINICAL DATA:  Altered mental status.  EXAM: CHEST  2 VIEW  COMPARISON:  Two-view chest 03/14/2013.  FINDINGS: Diffuse interstitial edema has increased again. The heart is mildly enlarged. The lung volumes are low. Pleural plaques are again noted. No significant effusion is evident.  IMPRESSION: 1. Mild cardiomegaly with increased interstitial markings suggesting edema superimposed on chronic disease. 2. Stable pleural plaques.   Electronically Signed   By: Lawrence Santiago M.D.   On: 04/10/2013 13:13   Ct Head Wo Contrast  04/10/2013   CLINICAL DATA:  Fall.  Altered mental status.  EXAM: CT HEAD WITHOUT CONTRAST  CT CERVICAL SPINE WITHOUT CONTRAST  TECHNIQUE: Multidetector CT imaging of the head and cervical spine was performed following the standard protocol without intravenous contrast.  Multiplanar CT image reconstructions of the cervical spine were also generated.  COMPARISON:  CT head without contrast 10/30/2012. CT cervical spine 04/17/2010  FINDINGS: CT HEAD FINDINGS  Moderate generalized atrophy is present. Is remote encephalomalacia over the high right frontal lobe. A remote infarct of the left occipital lobe is stable. Remote lacunar infarcts are present in the basal ganglia bilaterally, more prominent on the left. Ex vacuo dilation is more evident in the left ventricle is well.  No acute cortical infarct, hemorrhage, or mass lesion is present.  The paranasal sinuses and mastoid air cells are clear. The osseous skull is intact. No significant extracranial soft tissue injury is evident. Vascular calcifications are again noted within the cavernous carotid arteries bilaterally.  CT CERVICAL SPINE FINDINGS  The cervical spine is imaged from the skullbase through T2. Multilevel endplate degenerative changes are similar to the prior exam. Alignment is unchanged. No acute fracture or traumatic subluxation is evident. A soft tissues demonstrate atherosclerotic calcifications within the carotid bifurcations bilaterally. There is mild heterogeneity of the thyroid without a discrete lesion.  IMPRESSION: 1. Stable atrophy and diffuse white matter disease. 2. Remote encephalomalacia of the high right frontal lobe and left occipital lobe is stable. 3. No acute intracranial abnormality or evidence for focal trauma. 4. Moderate spondylosis throughout the cervical spine without evidence for acute fracture or traumatic subluxation   Electronically Signed   By: Lawrence Santiago M.D.   On: 04/10/2013 13:29   Ct Cervical Spine Wo  Contrast  04/10/2013   CLINICAL DATA:  Fall.  Altered mental status.  EXAM: CT HEAD WITHOUT CONTRAST  CT CERVICAL SPINE WITHOUT CONTRAST  TECHNIQUE: Multidetector CT imaging of the head and cervical spine was performed following the standard protocol without intravenous contrast.  Multiplanar CT image reconstructions of the cervical spine were also generated.  COMPARISON:  CT head without contrast 10/30/2012. CT cervical spine 04/17/2010  FINDINGS: CT HEAD FINDINGS  Moderate generalized atrophy is present. Is remote encephalomalacia over the high right frontal lobe. A remote infarct of the left occipital lobe is stable. Remote lacunar infarcts are present in the basal ganglia bilaterally, more prominent on the left. Ex vacuo dilation is more evident in the left ventricle is well.  No acute cortical infarct, hemorrhage, or mass lesion is present.  The paranasal sinuses and mastoid air cells are clear. The osseous skull is intact. No significant extracranial soft tissue injury is evident. Vascular calcifications are again noted within the cavernous carotid arteries bilaterally.  CT CERVICAL SPINE FINDINGS  The cervical spine is imaged from the skullbase through T2. Multilevel endplate degenerative changes are similar to the prior exam. Alignment is unchanged. No acute fracture or traumatic subluxation is evident. A soft tissues demonstrate atherosclerotic calcifications within the carotid bifurcations bilaterally. There is mild heterogeneity of the thyroid without a discrete lesion.  IMPRESSION: 1. Stable atrophy and diffuse white matter disease. 2. Remote encephalomalacia of the high right frontal lobe and left occipital lobe is stable. 3. No acute intracranial abnormality or evidence for focal trauma. 4. Moderate spondylosis throughout the cervical spine without evidence for acute fracture or traumatic subluxation   Electronically Signed   By: Lawrence Santiago M.D.   On: 04/10/2013 13:29    Medications: I have reviewed the patient's current medications.  Assessment/Plan: UTI - Levaquin oral. Previous microbiology in September 2014 with Klebsiella resistant to Ceftriaxone, Zosyn, intermediate to Levofloxacin, Cipro, sensitive to Nitrofurantoin and Bactrim. He is pen allergic and has  allergies to sulfas. Avoid Nitrofurantoin given age/renal function. Followup on urine culture sensitivities.  Chronic combined systolic and diastolic heart failure - most recent 2D echo 11/2012 with EF 45-50%. Appears euvolemic on po diuretics CAD - continue home medications  HTN - continue home medications, PRN hydralazine for SBP > 180.  DM - last HBA1C 12/2012 of 6.8. Continue SSI while in the hospital.  Acute on chronic renal failure - creatinine at baseline.  Mild hypokalemia - rechecking today  PVD - stable, followed by vascular as an outpatient  A fib - rate controlled. Probably not candidate for anticoagulation given falls.  CAD - continue home medications  Diet: No concentrated sweet diet  Fluids: none  DVT Prophylaxis: heparin  Code Status: Full  Family Communication: son over the phone  Disposition Plan: inpatient - hope to discharge today   LOS: 2 days   [name] JOSEPH 04/12/2013, 7:08 AM

## 2013-04-12 NOTE — Progress Notes (Signed)
Nutrition Brief Note  Patient identified on the Malnutrition Screening Tool (MST) Report for recent weight lost without trying (patient unsure).  Per readings, below patient's weight down,? accuracy. RD spoke with patient's caregiver who reports patient eats well at home & his weight has been stable.  Wt Readings from Last 15 Encounters:  04/12/13 168 lb 10.4 oz (76.5 kg)  03/01/13 216 lb (97.977 kg)  02/20/13 216 lb (97.977 kg)  01/31/13 216 lb (97.977 kg)  01/31/13 216 lb (97.977 kg)  01/25/13 216 lb (97.977 kg)  12/26/12 211 lb 10.3 oz (96 kg)  12/07/12 223 lb 15.8 oz (101.6 kg)  11/01/12 213 lb 13.5 oz (97 kg)  10/17/12 223 lb 6.4 oz (101.334 kg)  10/15/12 222 lb 4.8 oz (100.835 kg)  09/24/12 219 lb 9.3 oz (99.6 kg)  06/06/12 220 lb (99.791 kg)  02/04/11 231 lb (104.781 kg)  12/31/10 227 lb (102.967 kg)    Body mass index is 30.84 kg/(m^2). Patient meets criteria for Obesity Class I based on current BMI.   Current diet order is Carbohydrate Modified Medium Calorie, patient is consuming approximately 100% of meals at this time. Labs and medications reviewed.   No nutrition interventions warranted at this time. If nutrition issues arise, please consult RD.   Arthur Holms, RD, LDN Pager #: [phone] After-Hours Pager #: [phone]

## 2013-04-12 NOTE — Evaluation (Signed)
Physical Therapy Evaluation Patient Details Name: [name] MRN: [id number] DOB: July 21, 1917 Today's Date: 04/12/2013 Time: 1130-1220 PT Time Calculation (min): 50 min  PT Assessment / Plan / Recommendation History of Present Illness  Pt admit for UTI.    Clinical Impression  Pt admitted with above. Pt currently with functional limitations due to the deficits listed below (see PT Problem List).  Pt will benefit from skilled PT to increase their independence and safety with mobility to allow discharge to the venue listed below.     PT Assessment  Patient needs continued PT services    Follow Up Recommendations  SNF;Supervision/Assistance - 24 hour (If BP improves and pt min assist with mobility, HHPT,HHOT )                Equipment Recommendations  None recommended by PT         Frequency Min 3X/week    Precautions / Restrictions Precautions Precautions: Fall Restrictions Weight Bearing Restrictions: No   Pertinent Vitals/Pain BP to 100/44 after walk, HR 55 bpm.  Pt dizzy and had episode of close to passing out with ambulation.  No pain per pt.      Mobility  Bed Mobility Bed Mobility: Rolling Left;Left Sidelying to Sit;Sitting - Scoot to Edge of Bed Rolling Left: 3: Mod assist Left Sidelying to Sit: 3: Mod assist;HOB elevated Sitting - Scoot to Edge of Bed: 4: Min assist Details for Bed Mobility Assistance: Pt needed cues and assist for bed mobility.  Too weak to move LEs off bed or elevate trunk without assist.  Transfers Transfers: Sit to Stand;Stand to Sit Sit to Stand: With upper extremity assist;From bed;4: Min assist Stand to Sit: 4: Min assist;With upper extremity assist;To chair/3-in-1;With armrests Details for Transfer Assistance: Pt needed cues for hand placement.  Pt needed steadying prior to initiating gait. Ambulation/Gait Ambulation/Gait Assistance: 1: +2 Total assist Ambulation/Gait: Patient Percentage: 50% Ambulation Distance (Feet): 20  Feet Assistive device: Rolling walker Ambulation/Gait Assistance Details: Pt initially needing min guard assist with RW with ambulation however after ambulating 15 feet began to lean to left needing +2 assist with pt needing incr assist to get him to the chair.  Pt losing postural stability and not fully responsive.  Took BP at 100/44 with HR 55bpm.  Notified nursing of episode.  Pts weekly caregiver present and confirms that pt is much weaker now than prior to admit.   Gait Pattern: Step-to pattern;Decreased stride length;Decreased step length - left;Ataxic;Wide base of support;Trunk flexed Gait velocity: decreased Stairs: No Wheelchair Mobility Wheelchair Mobility: No         PT Diagnosis: Generalized weakness  PT Problem List: Decreased activity tolerance;Decreased balance;Decreased mobility;Decreased knowledge of use of DME;Decreased safety awareness;Decreased knowledge of precautions PT Treatment Interventions: Gait training;DME instruction;Functional mobility training;Therapeutic activities;Therapeutic exercise;Balance training;Patient/family education     PT Goals(Current goals can be found in the care plan section) Acute Rehab PT Goals Patient Stated Goal: to go home PT Goal Formulation: With patient Time For Goal Achievement: 04/19/13 Potential to Achieve Goals: Good  Visit Information  Last PT Received On: 04/12/13 Assistance Needed: +2 History of Present Illness: Pt admit for UTI.         Prior Functioning  Home Living Family/patient expects to be discharged to:: Private residence Living Arrangements: Children;Other relatives Available Help at Discharge: Family;Available 24 hours/day (lives with son/daughter in law, caregiver 8-4 M-Sat,8-8 Nancy Fetter) Type of Home: House Home Access: Stairs to enter [employer] of Steps: 4 Entrance Stairs-Rails:  Right;Left;Can reach both Home Layout: One level Home Equipment: Walker - 2 wheels;Wheelchair - manual;Bedside  commode Additional Comments: Info given by weekly caregiver Prior Function Level of Independence: Needs assistance Gait / Transfers Assistance Needed: min guard assist with ambulation with RW ADL's / Homemaking Assistance Needed: total assist with bathing and grooming, feeds himself Communication Communication: HOH Dominant Hand: Right    Cognition  Cognition Arousal/Alertness: Lethargic Behavior During Therapy: Flat affect Overall Cognitive Status: History of cognitive impairments - at baseline    Extremity/Trunk Assessment Upper Extremity Assessment Upper Extremity Assessment: Defer to OT evaluation Lower Extremity Assessment Lower Extremity Assessment: Generalized weakness Cervical / Trunk Assessment Cervical / Trunk Assessment: Kyphotic   Balance Balance Balance Assessed: Yes Static Standing Balance Static Standing - Balance Support: Bilateral upper extremity supported;During functional activity Static Standing - Level of Assistance: 3: Mod assist;1: +2 Total assist;Patient percentage (comment) (pt=50%) Static Standing - Comment/# of Minutes: varied stability due to pt weak and BP low with standing.    End of Session PT - End of Session Equipment Utilized During Treatment: Gait belt Activity Tolerance: Patient limited by fatigue Patient left: in chair;with call bell/phone within reach;with chair alarm set;with nursing/sitter in room Nurse Communication: Mobility status       INGOLD,Avante Carneiro 04/12/2013, 12:47 PM [facility] [phone] (pager)

## 2013-04-12 NOTE — Discharge Summary (Signed)
Physician Discharge Summary  Patient ID: [name] MRN: [id number] DOB/AGE: 1917-11-25 80 y.o.  Admit date: 04/10/2013 Discharge date: 04/12/2013  Admission Diagnoses:UTI Atrial fibrillation Type 2 diabetes mellitus Hypertension Congestive heart failure, diastolic dysfunction Acute on Chronic kidney disease stage III Peripheral vascular disease Coronary artery disease   Discharge Diagnoses:  Principal Problem:   UTI (lower urinary tract infection) Active Problems:   Atrial fibrillation   T2DM (type 2 diabetes mellitus)   Hypertension   Anemia   Acute on chronic renal insufficiency   Type II or unspecified type diabetes mellitus with renal manifestations, not stated as uncontrolled(250.40)   Hypokalemia   PVD (peripheral vascular disease)   Chronic kidney disease, stage III (moderate)   Hyperlipidemia   Chronic diastolic heart failure   Discharged Condition: good  Hospital Course: the patient was admitted on 04/10/2013 with increased generalized weakness and a fall 2 days prior to admission and slightly more confusion of her baseline.he was started on Primaxin and within 24 hours felt much better. He was switched over to Levaquin. Cultures for gram-negative rods, ID pending at the time of this dictation. His kidney function improved and at discharge was at baseline creatinine 1.68.platelet count was low at discharge 1 1000, subcutaneous heparin was stopped.  Consults: None  Significant Diagnostic Studies: labs: January 1BUN 31 creatinine 1.68, CBC hemoglobin 10.1 platelet count 101,000, microbiology: urine culture: Gram-negative rods and radiology: X-Ray: No acute disease and CT scan: No acute disease and no fracture  Treatments: IV hydration and antibiotics: Levaquin and Meropenem  Discharge Exam: Blood pressure 167/95, pulse 115, temperature 98.1 F (36.7 C), temperature source Oral, resp. rate 17, height 5\' 2"  (1.575 m), weight 76.5 kg (168 lb 10.4 oz), SpO2  94.00%. General appearance: alert Resp: clear to auscultation bilaterally Cardio: regular rate and rhythm  Disposition: 03-Skilled Nursing Facility   Future Appointments Provider Department Dept Phone   05/22/2013 10:30 AM Sueanne Margarita, MD [place] [phone]   06/07/2013 11:00 AM [facility] [place] [phone]   06/07/2013 12:00 PM Sharmon Leyden Nickel, NP [facility] [phone]       Medication List         acetaminophen 325 MG tablet  Commonly known as:  TYLENOL  Take 650 mg by mouth every 4 (four) hours as needed for pain.     albuterol 108 (90 BASE) MCG/ACT inhaler  Commonly known as:  PROVENTIL HFA;VENTOLIN HFA  Inhale 2 puffs into the lungs every 6 (six) hours as needed for wheezing.     amLODipine 10 MG tablet  Commonly known as:  NORVASC  Take 10 mg by mouth daily.     aspirin EC 81 MG tablet  Take 81 mg by mouth daily.     beta carotene w/minerals tablet  Take 1 tablet by mouth daily.     cilostazol 100 MG tablet  Commonly known as:  PLETAL  Take 100 mg by mouth 2 (two) times daily.     cyclobenzaprine 10 MG tablet  Commonly known as:  FLEXERIL  Take 5 mg by mouth at bedtime.     furosemide 40 MG tablet  Commonly known as:  LASIX  Take 40 mg by mouth 2 (two) times daily.     glipiZIDE 10 MG tablet  Commonly known as:  GLUCOTROL  Take 10 mg by mouth daily.     isosorbide mononitrate 30 MG 24 hr tablet  Commonly known as:  IMDUR  Take 30 mg by mouth daily.     levofloxacin 500 MG tablet  Commonly known as:  LEVAQUIN  Take 1 tablet (500 mg total) by mouth daily.     lisinopril 20 MG tablet  Commonly known as:  PRINIVIL,ZESTRIL  Take 20 mg by mouth daily.     metoprolol succinate 25 MG 24 hr tablet  Commonly known as:  TOPROL-XL  Take 25 mg by mouth daily after breakfast.     multivitamin with minerals Tabs tablet  Take 1 tablet by mouth daily.     niacin 500  MG tablet  Take 500 mg by mouth at bedtime.     nitroGLYCERIN 0.4 MG SL tablet  Commonly known as:  NITROSTAT  Place 0.4 mg under the tongue every 5 (five) minutes as needed for chest pain.     tamsulosin 0.4 MG Caps capsule  Commonly known as:  FLOMAX  Take 0.4 mg by mouth daily after breakfast.     vitamin B-12 1000 MCG tablet  Commonly known as:  CYANOCOBALAMIN  Take 1,000 mcg by mouth daily.           Follow-up Information   Follow up with Irven Shelling, MD.   Specialty:  Internal Medicine   Contact information:   [address] [place] [address] [phone]       Signed: Irven Shelling 04/12/2013, 7:36 AM

## 2013-04-13 LAB — URINE CULTURE: Colony Count: >=100000

## 2013-04-13 LAB — BASIC METABOLIC PANEL
BUN: 39 mg/dL — ABNORMAL HIGH (ref 6–23)
CO2: 25 mEq/L (ref 19–32)
Calcium: 8.7 mg/dL (ref 8.4–10.5)
Chloride: 101 mEq/L (ref 96–112)
Creatinine, Ser: 2.11 mg/dL — ABNORMAL HIGH (ref 0.50–1.35)
GFR calc Af Amer: 29 mL/min — ABNORMAL LOW (ref >90)
GFR, EST NON AFRICAN AMERICAN: 25 mL/min — AB (ref >90)
GLUCOSE: 134 mg/dL — AB (ref 70–99)
POTASSIUM: 4.4 meq/L (ref 3.7–5.3)
SODIUM: 138 meq/L (ref 137–147)

## 2013-04-13 LAB — GLUCOSE, CAPILLARY
GLUCOSE-CAPILLARY: 144 mg/dL — AB (ref 70–99)
GLUCOSE-CAPILLARY: 157 mg/dL — AB (ref 70–99)
Glucose-Capillary: 179 mg/dL — ABNORMAL HIGH (ref 70–99)
Glucose-Capillary: 144 mg/dL — ABNORMAL HIGH (ref 70–99)

## 2013-04-13 NOTE — Progress Notes (Signed)
Subjective: Patient denies any complaints states he wants to go home. BP stable, states he ate well  Objective: Vital signs in last 24 hours: Temp:  [97.7 F (36.5 C)-98.8 F (37.1 C)] 98.8 F (37.1 C) (01/03 0435) Pulse Rate:  [55-74] 67 (01/03 0435) Resp:  [18-20] 20 (01/03 0435) BP: (100-144)/(46-71) 123/71 mmHg (01/03 0435) SpO2:  [95 %-98 %] 98 % (01/03 0435) Weight change:  Last BM Date: 04/11/13  Intake/Output from previous day: 01/02 0701 - 01/03 0700 In: 840 [P.O.:840] Out: 1400 [Urine:1400] Intake/Output this shift:    General appearance: NAD Resp: clear to auscultation bilaterally Cardio: irregularly irregular rhythm GI: soft, non-tender; bowel sounds normal; no masses,  no organomegaly  Lab Results:  Results for orders placed during the hospital encounter of 04/10/13 (from the past 24 hour(s))  GLUCOSE, CAPILLARY     Status: Abnormal   Collection Time    04/12/13 11:51 AM      Result Value Range   Glucose-Capillary 138 (*) 70 - 99 mg/dL   Comment 1 Notify RN     Comment 2 Documented in Chart    GLUCOSE, CAPILLARY     Status: Abnormal   Collection Time    04/12/13  4:01 PM      Result Value Range   Glucose-Capillary 182 (*) 70 - 99 mg/dL   Comment 1 Notify RN     Comment 2 Documented in Chart    GLUCOSE, CAPILLARY     Status: Abnormal   Collection Time    04/12/13  9:03 PM      Result Value Range   Glucose-Capillary 127 (*) 70 - 99 mg/dL  BASIC METABOLIC PANEL     Status: Abnormal   Collection Time    04/13/13  5:19 AM      Result Value Range   Sodium 138  137 - 147 mEq/L   Potassium 4.4  3.7 - 5.3 mEq/L   Chloride 101  96 - 112 mEq/L   CO2 25  19 - 32 mEq/L   Glucose, Bld 134 (*) 70 - 99 mg/dL   BUN 39 (*) 6 - 23 mg/dL   Creatinine, Ser 2.11 (*) 0.50 - 1.35 mg/dL   Calcium 8.7  8.4 - 10.5 mg/dL   GFR calc non Af Amer 25 (*) >90 mL/min   GFR calc Af Amer 29 (*) >90 mL/min  GLUCOSE, CAPILLARY     Status: Abnormal   Collection Time   04/13/13  6:40 AM      Result Value Range   Glucose-Capillary 144 (*) 70 - 99 mg/dL      Studies/Results: No results found.  Medications:  Prior to Admission:  Prescriptions prior to admission  Medication Sig Dispense Refill  . acetaminophen (TYLENOL) 325 MG tablet Take 650 mg by mouth every 4 (four) hours as needed for pain.      Marland Kitchen albuterol (PROVENTIL HFA;VENTOLIN HFA) 108 (90 BASE) MCG/ACT inhaler Inhale 2 puffs into the lungs every 6 (six) hours as needed for wheezing.  1 Inhaler  2  . aspirin EC 81 MG tablet Take 81 mg by mouth daily.       . beta carotene w/minerals (OCUVITE) tablet Take 1 tablet by mouth daily.       . cilostazol (PLETAL) 100 MG tablet Take 100 mg by mouth 2 (two) times daily.      . cyclobenzaprine (FLEXERIL) 10 MG tablet Take 5 mg by mouth at bedtime.      . furosemide (LASIX)  40 MG tablet Take 40 mg by mouth 2 (two) times daily.      Marland Kitchen glipiZIDE (GLUCOTROL) 10 MG tablet Take 10 mg by mouth daily.      . isosorbide mononitrate (IMDUR) 30 MG 24 hr tablet Take 30 mg by mouth daily.      Marland Kitchen lisinopril (PRINIVIL,ZESTRIL) 20 MG tablet Take 20 mg by mouth daily.      . metoprolol succinate (TOPROL-XL) 25 MG 24 hr tablet Take 25 mg by mouth daily after breakfast.      . Multiple Vitamin (MULTIVITAMIN WITH MINERALS) TABS tablet Take 1 tablet by mouth daily.      . niacin 500 MG tablet Take 500 mg by mouth at bedtime.      . nitroGLYCERIN (NITROSTAT) 0.4 MG SL tablet Place 0.4 mg under the tongue every 5 (five) minutes as needed for chest pain.      . tamsulosin (FLOMAX) 0.4 MG CAPS Take 0.4 mg by mouth daily after breakfast.      . vitamin B-12 (CYANOCOBALAMIN) 1000 MCG tablet Take 1,000 mcg by mouth daily.      Marland Kitchen amLODipine (NORVASC) 10 MG tablet Take 10 mg by mouth daily.       Scheduled: . aspirin EC  81 mg Oral Daily  . cyclobenzaprine  5 mg Oral QHS  . furosemide  40 mg Oral BID  . insulin aspart  0-9 Units Subcutaneous TID WC  . isosorbide mononitrate  30  mg Oral Daily  . [START ON 04/14/2013] levofloxacin  500 mg Oral Q48H  . lisinopril  20 mg Oral Daily  . niacin  500 mg Oral QHS  . sodium chloride  3 mL Intravenous Q12H   Continuous:  IFO:YDXAJOINO, hydrALAZINE  Assessment/Plan: Elderly male with multiple comorbidities admitted with weakness confusion due to uti. Currently on oral abx levaquin which covers the enterobacter in urine. Disposition held yesterday due to dizziness while ambulating with PT. Recommend continue current tx, await f/u eval by PT Chronic combined systolic and diastolic heart failure - most recent 2D echo 11/2012 with EF 45-50%. Appears euvolemic on po diuretics  CAD - continue home medications  HTN - stable DM - last HBA1C 12/2012 of 6.8. Continue SSI while in the hospital.  Acute on chronic renal failure - creatinine at baseline.  Mild hypokalemia - resolved PVD - stable, followed by vascular as an outpatient  A fib - rate controlled. Probably not candidate for anticoagulation given falls.  CAD - continue home medications    LOS: 3 days   [name] D 04/13/2013, 10:17 AM

## 2013-04-13 NOTE — Progress Notes (Signed)
Physical Therapy Treatment Patient Details Name: [name] MRN: [id number] DOB: Jul 11, 1917 Today's Date: 04/13/2013 Time: 4098-1191 PT Time Calculation (min): 41 min  PT Assessment / Plan / Recommendation  History of Present Illness Pt admit for UTI.     PT Comments   Pt continues to report dizziness with mobility and need incr. assist for safety/balance with mobility. BP did drop with session. RN aware (in room to assist with peri care after pt used BSC).   Follow Up Recommendations  SNF;Supervision/Assistance - 24 hour     Equipment Recommendations  None recommended by PT       Frequency Min 3X/week   Progress towards PT Goals Progress towards PT goals: Progressing toward goals  Plan Current plan remains appropriate    Precautions / Restrictions Precautions Precautions: Fall Precaution Comments: left lateral lean (ilncreases withfatigue). Restrictions Weight Bearing Restrictions: No    Pertinent Vitals/Pain Before session (supine in bed):118/47 After sitting up (EOB): 94/64 After transfer to bsc: 115/54 End of session (up on bsc x10 minutes and then to recliner): 92/48 Reporting no dizziness once reclined in recliner    Mobility  Bed Mobility Rolling Left: 4: Min assist Left Sidelying to Sit: 3: Mod assist;HOB elevated Sitting - Scoot to Edge of Bed: 4: Min assist;With rail Details for Bed Mobility Assistance: Pt needed multimodal  cues and assist for bed mobility.  Too weak to move LEs off bed or elevate trunk without assist. incr time needed for processing Transfers Transfers: Stand Pivot Transfers Sit to Stand: 4: Min assist;From bed;3: Mod assist;From chair/3-in-1;With upper extremity assist;With armrests Stand to Sit: 3: Mod assist;To chair/3-in-1;With upper extremity assist Stand Pivot Transfers: 3: Mod assist;2: Max assist Details for Transfer Assistance: min to mod assist with standing up. mod to max assist to sit safety and controlled to bsc and  recliner, respectively. max assist with max cues for stand<>pivot transfer x2 with RW (pt attempting to sit prematurely/before getting to surface, assit to manage RW, assist for balance and posture as well. Pt did report  dizziness with both transfers that resolved over time with sitting.                  Ambulation/Gait Ambulation/Gait Assistance: Not tested (comment)     PT Goals (current goals can now be found in the care plan section) Acute Rehab PT Goals Patient Stated Goal: to go home PT Goal Formulation: With patient Time For Goal Achievement: 04/19/13 Potential to Achieve Goals: Good  Visit Information  Last PT Received On: 04/13/13 Assistance Needed: +2 History of Present Illness: Pt admit for UTI.      Subjective Data  Patient Stated Goal: to go home   Cognition  Cognition Arousal/Alertness: Awake/alert Behavior During Therapy: Flat affect Overall Cognitive Status: History of cognitive impairments - at baseline       End of Session PT - End of Session Equipment Utilized During Treatment: Gait belt Activity Tolerance: Patient limited by fatigue Patient left: in chair;with call bell/phone within reach;with chair alarm set Nurse Communication: Mobility status;Other (comment) (BP readings from session)   GP     Willow Ora 04/13/2013, 1:44 PM  Willow Ora, PTA Office- [phone]

## 2013-04-14 LAB — GLUCOSE, CAPILLARY
Glucose-Capillary: 120 mg/dL — ABNORMAL HIGH (ref 70–99)
Glucose-Capillary: 181 mg/dL — ABNORMAL HIGH (ref 70–99)
Glucose-Capillary: 149 mg/dL — ABNORMAL HIGH (ref 70–99)
Glucose-Capillary: 193 mg/dL — ABNORMAL HIGH (ref 70–99)

## 2013-04-14 LAB — BASIC METABOLIC PANEL
BUN: 47 mg/dL — ABNORMAL HIGH (ref 6–23)
CO2: 24 mEq/L (ref 19–32)
Calcium: 8.7 mg/dL (ref 8.4–10.5)
Chloride: 98 mEq/L (ref 96–112)
Creatinine, Ser: 2.36 mg/dL — ABNORMAL HIGH (ref 0.50–1.35)
GFR calc Af Amer: 26 mL/min — ABNORMAL LOW (ref >90)
GFR calc non Af Amer: 22 mL/min — ABNORMAL LOW (ref >90)
GLUCOSE: 160 mg/dL — AB (ref 70–99)
POTASSIUM: 4.3 meq/L (ref 3.7–5.3)
Sodium: 134 mEq/L — ABNORMAL LOW (ref 137–147)

## 2013-04-14 NOTE — Progress Notes (Signed)
Subjective: Patient resting, appears tired, no complaints  Objective: Vital signs in last 24 hours: Temp:  [98.2 F (36.8 C)-98.6 F (37 C)] 98.2 F (36.8 C) (01/04 0401) Pulse Rate:  [60-64] 61 (01/04 0401) Resp:  [18-21] 21 (01/04 0401) BP: (117-122)/(48-60) 119/48 mmHg (01/04 0401) SpO2:  [96 %-98 %] 96 % (01/04 0401) Weight change:  Last BM Date: 04/11/13  Intake/Output from previous day:   Intake/Output this shift:    General appearance: flat affect Resp: clear to auscultation bilaterally Cardio: regular rate and rhythm Extremities: pas in place, no edema  Lab Results:  Results for orders placed during the hospital encounter of 04/10/13 (from the past 24 hour(s))  GLUCOSE, CAPILLARY     Status: Abnormal   Collection Time    04/13/13 11:52 AM      Result Value Range   Glucose-Capillary 157 (*) 70 - 99 mg/dL  GLUCOSE, CAPILLARY     Status: Abnormal   Collection Time    04/13/13  4:29 PM      Result Value Range   Glucose-Capillary 179 (*) 70 - 99 mg/dL  GLUCOSE, CAPILLARY     Status: Abnormal   Collection Time    04/13/13  9:17 PM      Result Value Range   Glucose-Capillary 144 (*) 70 - 99 mg/dL  GLUCOSE, CAPILLARY     Status: Abnormal   Collection Time    04/14/13  6:10 AM      Result Value Range   Glucose-Capillary 120 (*) 70 - 99 mg/dL   Comment 1 Documented in Chart     Comment 2 Notify RN        Studies/Results: No results found.  Medications:  Prior to Admission:  Prescriptions prior to admission  Medication Sig Dispense Refill  . acetaminophen (TYLENOL) 325 MG tablet Take 650 mg by mouth every 4 (four) hours as needed for pain.      Marland Kitchen albuterol (PROVENTIL HFA;VENTOLIN HFA) 108 (90 BASE) MCG/ACT inhaler Inhale 2 puffs into the lungs every 6 (six) hours as needed for wheezing.  1 Inhaler  2  . aspirin EC 81 MG tablet Take 81 mg by mouth daily.       . beta carotene w/minerals (OCUVITE) tablet Take 1 tablet by mouth daily.       . cilostazol  (PLETAL) 100 MG tablet Take 100 mg by mouth 2 (two) times daily.      . cyclobenzaprine (FLEXERIL) 10 MG tablet Take 5 mg by mouth at bedtime.      . furosemide (LASIX) 40 MG tablet Take 40 mg by mouth 2 (two) times daily.      Marland Kitchen glipiZIDE (GLUCOTROL) 10 MG tablet Take 10 mg by mouth daily.      . isosorbide mononitrate (IMDUR) 30 MG 24 hr tablet Take 30 mg by mouth daily.      Marland Kitchen lisinopril (PRINIVIL,ZESTRIL) 20 MG tablet Take 20 mg by mouth daily.      . metoprolol succinate (TOPROL-XL) 25 MG 24 hr tablet Take 25 mg by mouth daily after breakfast.      . Multiple Vitamin (MULTIVITAMIN WITH MINERALS) TABS tablet Take 1 tablet by mouth daily.      . niacin 500 MG tablet Take 500 mg by mouth at bedtime.      . nitroGLYCERIN (NITROSTAT) 0.4 MG SL tablet Place 0.4 mg under the tongue every 5 (five) minutes as needed for chest pain.      . tamsulosin (FLOMAX) 0.4 MG  CAPS Take 0.4 mg by mouth daily after breakfast.      . vitamin B-12 (CYANOCOBALAMIN) 1000 MCG tablet Take 1,000 mcg by mouth daily.      Marland Kitchen amLODipine (NORVASC) 10 MG tablet Take 10 mg by mouth daily.       Scheduled: . aspirin EC  81 mg Oral Daily  . cyclobenzaprine  5 mg Oral QHS  . furosemide  40 mg Oral BID  . insulin aspart  0-9 Units Subcutaneous TID WC  . isosorbide mononitrate  30 mg Oral Daily  . levofloxacin  500 mg Oral Q48H  . lisinopril  20 mg Oral Daily  . niacin  500 mg Oral QHS  . sodium chloride  3 mL Intravenous Q12H   Continuous:  OXB:[id number], hydrALAZINE  Assessment/Plan: Elderly male with multiple comorbidities admitted with weakness confusion due to uti. Currently on oral abx levaquin which covers the enterobacter in urine. Disposition held Friday due to dizziness while ambulating with PT. Patient still woozy with PT and recommends SNF Chronic combined systolic and diastolic heart failure CAD - continue home medications  HTN - stable  DM - last HBA1C 12/2012 of 6.8. Continue SSI while in the hospital.   Acute on chronic renal failure - crt up some, reduce diuretic check f/u labs  PVD - stable, followed by vascular as an outpatient  A fib - rate controlled. Probably not candidate for anticoagulation given falls.     LOS: 4 days   [name] D 04/14/2013, 9:45 AM

## 2013-04-15 LAB — GLUCOSE, CAPILLARY
GLUCOSE-CAPILLARY: 147 mg/dL — AB (ref 70–99)
GLUCOSE-CAPILLARY: 180 mg/dL — AB (ref 70–99)
Glucose-Capillary: 141 mg/dL — ABNORMAL HIGH (ref 70–99)
Glucose-Capillary: 168 mg/dL — ABNORMAL HIGH (ref 70–99)

## 2013-04-15 LAB — BASIC METABOLIC PANEL
BUN: 54 mg/dL — ABNORMAL HIGH (ref 6–23)
CO2: 24 mEq/L (ref 19–32)
Calcium: 8.7 mg/dL (ref 8.4–10.5)
Chloride: 101 mEq/L (ref 96–112)
Creatinine, Ser: 2.36 mg/dL — ABNORMAL HIGH (ref 0.50–1.35)
GFR calc Af Amer: 26 mL/min — ABNORMAL LOW (ref >90)
GFR, EST NON AFRICAN AMERICAN: 22 mL/min — AB (ref >90)
Glucose, Bld: 135 mg/dL — ABNORMAL HIGH (ref 70–99)
POTASSIUM: 4.8 meq/L (ref 3.7–5.3)
SODIUM: 138 meq/L (ref 137–147)

## 2013-04-15 MED ORDER — SODIUM CHLORIDE 0.9 % IV SOLN
INTRAVENOUS | Status: DC
  Administered 2013-04-15 – 2013-04-16 (×3): via INTRAVENOUS

## 2013-04-15 NOTE — Progress Notes (Signed)
Subjective: Dizzy yesterday with standing  Objective: Vital signs in last 24 hours: Temp:  [97.4 F (36.3 C)-99.3 F (37.4 C)] 97.9 F (36.6 C) (01/05 0430) Pulse Rate:  [54-66] 54 (01/05 0430) Resp:  [16-18] 18 (01/05 0430) BP: (117-128)/(45-58) 128/45 mmHg (01/05 0430) SpO2:  [95 %-97 %] 97 % (01/05 0430) Weight change:  Last BM Date: 04/11/13  Intake/Output from previous day: 01/04 0701 - 01/05 0700 In: 240 [P.O.:240] Out: 700 [Urine:700] Intake/Output this shift:    General appearance: alert Resp: clear to auscultation bilaterally Cardio: regular rate and rhythm Extremities: extremities normal, atraumatic, no cyanosis or edema  Lab Results: No results found for this basename: WBC, HGB, HCT, PLT,  in the last 72 hours BMET  Recent Labs  04/13/13 0519 04/14/13 1130  NA 138 134*  K 4.4 4.3  CL 101 98  CO2 25 24  GLUCOSE 134* 160*  BUN 39* 47*  CREATININE 2.11* 2.36*  CALCIUM 8.7 8.7    Studies/Results: No results found.  Medications: I have reviewed the patient's current medications.  Assessment/Plan: UTI - Levaquin oral. Antibiotics 5. Chronic combined systolic and diastolic heart failure - most recent 2D echo 11/2012 with EF 45-50%.rising creatinine and orthostasis suggest he is volume depleted, lasix held, hold ACEI CAD - continue home medications  HTN - continue home medications, PRN hydralazine for SBP > 180.  DM - last HBA1C 12/2012 of 6.8. Continue SSI while in the hospital.  Acute on chronic renal failure - creatinine rising, holding lasix, hold ACEI, give IV normal saline   A fib - in NSR today, not a coumadin candidate, off beta blocker CAD - stable Thrombocytopenia recheck in am, off heparin Disposition - discuss with family, NHP vs home with home health   LOS: 5 days   [name] JOSEPH 04/15/2013, 7:19 AM

## 2013-04-15 NOTE — Progress Notes (Signed)
Clinical Social Work Department CLINICAL SOCIAL WORK PLACEMENT NOTE 04/15/2013  Patient:  JACQUELINE, DELAPENA  Account Number:  [id number] Admit date:  04/10/2013  Clinical Social Worker:  Megan Salon  Date/time:  04/15/2013 10:18 AM  Clinical Social Work is seeking post-discharge placement for this patient at the following level of care:   [place]   (*CSW will update this form in Epic as items are completed)   04/15/2013  Patient/family provided with [place] Department of Clinical Social Work's list of facilities offering this level of care within the geographic area requested by the patient (or if unable, by the patient's family).  04/15/2013  Patient/family informed of their freedom to choose among providers that offer the needed level of care, that participate in Medicare, Medicaid or managed care program needed by the patient, have an available bed and are willing to accept the patient.  04/15/2013  Patient/family informed of [facility] ownership interest in [facility], as well as of the fact that they are under no obligation to receive care at this facility.  PASARR submitted to EDS on 04/14/2013 PASARR number received from EDS on 04/14/2013  FL2 transmitted to all facilities in geographic area requested by pt/family on  04/15/2013 FL2 transmitted to all facilities within larger geographic area on   Patient informed that his/her managed care company has contracts with or will negotiate with  certain facilities, including the following:     Patient/family informed of bed offers received:   Patient chooses bed at  Physician recommends and patient chooses bed at    Patient to be transferred to  on   Patient to be transferred to facility by   The following physician request were entered in Epic:   Additional Comments:  Jeanette Caprice, MSW, [place]

## 2013-04-15 NOTE — Progress Notes (Signed)
Clinical Social Work Department BRIEF PSYCHOSOCIAL ASSESSMENT 04/15/2013  Patient:  [name], [name]     Account Number:  [id number]     Admit date:  04/10/2013  Clinical Social Worker:  Megan Salon  Date/Time:  04/15/2013 10:19 AM  Referred by:  RN  Date Referred:  04/15/2013 Referred for  SNF Placement   Other Referral:   Interview type:  Other - See comment Other interview type:   CM spoke to patient's son over the phone    PSYCHOSOCIAL DATA Living Status:  FAMILY Admitted from facility:   Level of care:   Primary support name:  Gerlean Ren Primary support relationship to patient:  CHILD, ADULT Degree of support available:   Good    CURRENT CONCERNS Current Concerns  Post-Acute Placement   Other Concerns:    SOCIAL WORK ASSESSMENT / PLAN CSW received referral from RN that patient may need SNF placement. CM spoke to patient' son over the phone who stated that he is agreeable to SNF placement, if patient does not improve while at the hospital. If patient improves, son states he can take patient home. Patient's son inquired about [facility] or [facility] and gave CSW permission to fax out. CSW will put FL2 on chart.   Assessment/plan status:  Psychosocial Support/Ongoing Assessment of Needs Other assessment/ plan:   Information/referral to community resources:   SNF information    PATIENT'S/FAMILY'S RESPONSE TO PLAN OF CARE: Patient's son is agreeable to SNF search.        Jeanette Caprice, MSW, [place]

## 2013-04-15 NOTE — Progress Notes (Signed)
Physical Therapy Treatment Patient Details Name: GUERIN LASHOMB MRN: [id number] DOB: [birth year] Today's Date: 04/15/2013 Time: 0998-3382 PT Time Calculation (min): 32 min  PT Assessment / Plan / Recommendation  History of Present Illness     PT Comments   Pt continues to be very limited with mobility due to increased weakness and increased left lateral lean during standing and transfers.  Performed several stand pivot transfers during this session in order to use 3in1, however requires +2 assist at this time for safety.  Continue to recommend SNF for follow up due to caregiver not able to provide +2 assist.    Follow Up Recommendations  SNF;Supervision/Assistance - 24 hour     Does the patient have the potential to tolerate intense rehabilitation     Barriers to Discharge        Equipment Recommendations  None recommended by PT    Recommendations for Other Services    Frequency Min 3X/week   Progress towards PT Goals Progress towards PT goals: Progressing toward goals  Plan Current plan remains appropriate    Precautions / Restrictions Precautions Precautions: Fall Precaution Comments: left lateral lean (ilncreases withfatigue). Restrictions Weight Bearing Restrictions: No   Pertinent Vitals/Pain No pain stated.     Mobility  Bed Mobility Bed Mobility: Supine to Sit Supine to Sit: 3: Mod assist Details for Bed Mobility Assistance: Pt did well bringing LEs to EOB and out of bed, however requires hand over hand cues for using handrail with RUE in order assist with elevating trunk.  Once pt had grasp of handrail, he did well assisting with elevating trunk.  Transfers Transfers: Stand Pivot Transfers Sit to Stand: 2: Max assist;From bed;From chair/3-in-1;With armrests Stand to Sit: 2: Max assist;With upper extremity assist;With armrests;To chair/3-in-1 Stand Pivot Transfers: 1: +2 Total assist;Other (comment) (pts caregiver assisted with guiding pts hips) Stand Pivot  Transfers: Patient Percentage: 30% Details for Transfer Assistance: Pt requires max assist for all standing and transfers with manual facilitation for increased forward weight shift.  Performed stand pivot bed to 3in1 initially with RW, however pt with increased difficulty sequencing and had caregiver assist with negotiation of RW, therefore on second attempt, had pt hold therapist arms/shoulders during transfer.  He continues to demonstrate increased left lateral lean that increases with fatigue.  Pt successful in voiding bowels in 3in1, however require total assist for peri care.  Note BP remained WNL during all upright mobility and read 142/66 following last transfer.  RN made aware.  Ambulation/Gait Ambulation/Gait Assistance: Not tested (comment)    Exercises     PT Diagnosis:    PT Problem List:   PT Treatment Interventions:     PT Goals (current goals can now be found in the care plan section) Acute Rehab PT Goals PT Goal Formulation: With patient Time For Goal Achievement: 04/19/13 Potential to Achieve Goals: Good  Visit Information  Last PT Received On: 04/15/13 Assistance Needed: +2    Subjective Data      Cognition  Cognition Arousal/Alertness: Lethargic Behavior During Therapy: Flat affect Overall Cognitive Status: History of cognitive impairments - at baseline    Balance     End of Session PT - End of Session Equipment Utilized During Treatment: Gait belt Activity Tolerance: Patient limited by fatigue Patient left: in chair;with call bell/phone within reach;with chair alarm set;with family/visitor present Nurse Communication: Mobility status   GP     Denice Bors 04/15/2013, 3:37 PM

## 2013-04-15 NOTE — Progress Notes (Signed)
Attempted to gain IV access on pt twice and was unsuccessful.  IV team was paged.

## 2013-04-16 LAB — CBC
HEMATOCRIT: 25.9 % — AB (ref 39.0–52.0)
Hemoglobin: 8.6 g/dL — ABNORMAL LOW (ref 13.0–17.0)
MCH: 33.5 pg (ref 26.0–34.0)
MCHC: 33.2 g/dL (ref 30.0–36.0)
MCV: 100.8 fL — AB (ref 78.0–100.0)
PLATELETS: 118 10*3/uL — AB (ref 150–400)
RBC: 2.57 MIL/uL — AB (ref 4.22–5.81)
RDW: 16.9 % — ABNORMAL HIGH (ref 11.5–15.5)
WBC: 6.4 10*3/uL (ref 4.0–10.5)

## 2013-04-16 LAB — GLUCOSE, CAPILLARY
GLUCOSE-CAPILLARY: 147 mg/dL — AB (ref 70–99)
GLUCOSE-CAPILLARY: 179 mg/dL — AB (ref 70–99)
Glucose-Capillary: 136 mg/dL — ABNORMAL HIGH (ref 70–99)
Glucose-Capillary: 150 mg/dL — ABNORMAL HIGH (ref 70–99)

## 2013-04-16 LAB — BASIC METABOLIC PANEL
BUN: 50 mg/dL — ABNORMAL HIGH (ref 6–23)
CO2: 24 meq/L (ref 19–32)
Calcium: 8.2 mg/dL — ABNORMAL LOW (ref 8.4–10.5)
Chloride: 101 mEq/L (ref 96–112)
Creatinine, Ser: 2.15 mg/dL — ABNORMAL HIGH (ref 0.50–1.35)
GFR calc Af Amer: 29 mL/min — ABNORMAL LOW (ref >90)
GFR calc non Af Amer: 25 mL/min — ABNORMAL LOW (ref >90)
Glucose, Bld: 153 mg/dL — ABNORMAL HIGH (ref 70–99)
Potassium: 4.2 mEq/L (ref 3.7–5.3)
Sodium: 138 mEq/L (ref 137–147)

## 2013-04-16 LAB — FERRITIN: Ferritin: 160 ng/mL (ref 22–322)

## 2013-04-16 MED ORDER — FERROUS SULFATE 325 (65 FE) MG PO TABS
325.0000 mg | ORAL_TABLET | Freq: Every day | ORAL | Status: DC
  Administered 2013-04-16 – 2013-04-17 (×2): 325 mg via ORAL
  Filled 2013-04-16 (×3): qty 1

## 2013-04-16 MED ORDER — DARBEPOETIN ALFA-POLYSORBATE 40 MCG/0.4ML IJ SOLN
40.0000 ug | Freq: Once | INTRAMUSCULAR | Status: AC
  Administered 2013-04-16: 40 ug via SUBCUTANEOUS
  Filled 2013-04-16: qty 0.4

## 2013-04-16 MED ORDER — GLIPIZIDE ER 2.5 MG PO TB24
2.5000 mg | ORAL_TABLET | Freq: Every day | ORAL | Status: DC
Start: 2013-04-16 — End: 2013-04-17
  Administered 2013-04-16 – 2013-04-17 (×2): 2.5 mg via ORAL
  Filled 2013-04-16 (×3): qty 1

## 2013-04-16 MED ORDER — VITAMIN B-12 1000 MCG PO TABS
1000.0000 ug | ORAL_TABLET | ORAL | Status: DC
  Administered 2013-04-17: 1000 ug via ORAL
  Filled 2013-04-16: qty 1

## 2013-04-16 NOTE — Progress Notes (Signed)
CSW spoke to patient's daughter over the phone about SNF placement. Patient does have a bed at [facility], family's preference. CSW will help facilitate dc when patient is medically ready and will arrange for EMS transportation to SNF.  Jeanette Caprice, MSW, [place]

## 2013-04-16 NOTE — Progress Notes (Addendum)
Charted on wrong pt.

## 2013-04-16 NOTE — Progress Notes (Signed)
Subjective: No complaints  Objective: Vital signs in last 24 hours: Temp:  [99 F (37.2 C)-99.7 F (37.6 C)] 99 F (37.2 C) (01/06 0531) Pulse Rate:  [60-67] 67 (01/06 0531) Resp:  [18] 18 (01/06 0531) BP: (152-164)/(64-70) 152/64 mmHg (01/06 0531) SpO2:  [92 %] 92 % (01/06 0531) Weight change:  Last BM Date: 04/15/13  Intake/Output from previous day: 01/05 0701 - 01/06 0700 In: 360 [P.O.:360] Out: 650 [Urine:650] Intake/Output this shift:    General appearance: alert Resp: clear to auscultation bilaterally Cardio: regular rate and rhythm, S1, S2 normal, no murmur, click, rub or gallop Extremities: extremities normal, atraumatic, no cyanosis or edema  Lab Results:  Recent Labs  04/16/13 0324  WBC 6.4  HGB 8.6*  HCT 25.9*  PLT 118*   BMET  Recent Labs  04/15/13 0655 04/16/13 0324  NA 138 138  K 4.8 4.2  CL 101 101  CO2 24 24  GLUCOSE 135* 153*  BUN 54* 50*  CREATININE 2.36* 2.15*  CALCIUM 8.7 8.2*    Studies/Results: No results found.  Medications: I have reviewed the patient's current medications.  Assessment/Plan: UTI - Levaquin oral. Antibiotics 6.  D/C foley Chronic combined systolic and diastolic heart failure - most recent 2D echo 11/2012 with EF 45-50%, creatinine improved, continue gentle IVFs and hold diuretics and ACEI CAD - continue home medications  HTN - continue home medications, PRN hydralazine for SBP > 180.  DM - last HBA1C 12/2012 of 6.8.d/c SSI and restart glipizide at low dose  Acute on chronic renal failure - creatinine improved, holding lasix and ACEI, give IV normal saline  A fib - in NSR today, not a coumadin candidate, off beta blocker and still relatively bradycardic Anemia, macrocytic, felt related to CKD, r/o myelodysplasia, give dose of aranesp, restart iron and check ferritin.  Also on B12, low middle of last year Thrombocytopenia stable, off heparin  Disposition - discuss with family, NHP vs home with home health   LOS: 6 days   [name] JOSEPH 04/16/2013, 7:25 AM

## 2013-04-16 NOTE — Progress Notes (Signed)
Physical Therapy Treatment Patient Details Name: [name] MRN: [id number] DOB: [birth year] Today's Date: 04/16/2013 Time: 1341-1410 PT Time Calculation (min): 29 min  PT Assessment / Plan / Recommendation  History of Present Illness Pt admit for UTI.     PT Comments   Pt more alert today, however remains with AMS compared to his baseline (per his hired caregiver present during session). Was better able to follow instructions and ambulated a short distance with +2 assist. Continues to be very high fall risk due to weakness and decr cognition.   Follow Up Recommendations  SNF;Supervision/Assistance - 24 hour     Does the patient have the potential to tolerate intense rehabilitation     Barriers to Discharge        Equipment Recommendations  None recommended by PT    Recommendations for Other Services    Frequency Min 2X/week   Progress towards PT Goals Progress towards PT goals: Progressing toward goals  Plan Current plan remains appropriate    Precautions / Restrictions Precautions Precautions: Fall Precaution Comments: left lateral lean (ilncreases withfatigue).   Pertinent Vitals/Pain no apparent distress     Mobility  Bed Mobility Bed Mobility: Rolling Left;Left Sidelying to Sit;Sitting - Scoot to [employer] of Bed Rolling Left: 4: Min assist Left Sidelying to Sit: 3: Mod assist Sitting - Scoot to Edge of Bed: 4: Min guard Details for Bed Mobility Assistance: Pt did well bringing LEs to EOB and out of bed, however requires hand over hand cues for using handrail with RUE in order assist with elevating trunk.  Once pt had grasp of handrail, he did well assisting with elevating trunk.  Transfers Transfers: Sit to Stand;Stand to Sit Sit to Stand: 1: +2 Total assist Sit to Stand: Patient Percentage: 60% Stand to Sit: 1: +2 Total assist Stand to Sit: Patient Percentage: 60% Details for Transfer Assistance: pt wanted to pull up on RW; therefore anchored RW bil and gave pt  physical assist to extend hips/knees and push down through UEs on RW Ambulation/Gait Ambulation/Gait Assistance: 1: +2 Total assist Ambulation/Gait: Patient Percentage: 60% Ambulation Distance (Feet): 8 Feet (4, 4 ) Assistive device: Rolling walker Ambulation/Gait Assistance Details: Pt with flexed posture and incr lean to Lt as he fatigued or tried to turn/pivot. Pt with short, shuffling steps. This was improved compared to 1/05    Exercises     PT Diagnosis:    PT Problem List:   PT Treatment Interventions:     PT Goals (current goals can now be found in the care plan section)    Visit Information  Last PT Received On: 04/16/13 Assistance Needed: +2 History of Present Illness: Pt admit for UTI.      Subjective Data      Cognition  Cognition Arousal/Alertness: Awake/alert Behavior During Therapy: Flat affect Overall Cognitive Status: Impaired/Different from baseline Area of Impairment: Orientation;Attention;Following commands;Awareness;Problem solving Orientation Level: Time;Situation Current Attention Level: Sustained Following Commands: Follows one step commands consistently Awareness:  (not yet intellectual re: deficits) Problem Solving: Slow processing;Difficulty sequencing;Requires verbal cues;Requires tactile cues General Comments: per caregiver, pt does not have h/o dementia and is not at baseline for cognition    Balance  Static Standing Balance Static Standing - Balance Support: Bilateral upper extremity supported Static Standing - Level of Assistance: 3: Mod assist  End of Session PT - End of Session Equipment Utilized During Treatment: Gait belt Activity Tolerance: Patient limited by fatigue Patient left: in chair;with call bell/phone within reach;with chair alarm  set;with family/visitor present (pt's hired caregiver)   GP     Yael Angerer 04/16/2013, 3:33 PM Pager [phone]

## 2013-04-17 LAB — GLUCOSE, CAPILLARY
Glucose-Capillary: 129 mg/dL — ABNORMAL HIGH (ref 70–99)
Glucose-Capillary: 151 mg/dL — ABNORMAL HIGH (ref 70–99)

## 2013-04-17 LAB — BASIC METABOLIC PANEL
BUN: 41 mg/dL — ABNORMAL HIGH (ref 6–23)
CO2: 23 mEq/L (ref 19–32)
Calcium: 8.7 mg/dL (ref 8.4–10.5)
Chloride: 106 mEq/L (ref 96–112)
Creatinine, Ser: 1.81 mg/dL — ABNORMAL HIGH (ref 0.50–1.35)
GFR calc Af Amer: 35 mL/min — ABNORMAL LOW (ref >90)
GFR calc non Af Amer: 31 mL/min — ABNORMAL LOW (ref >90)
Glucose, Bld: 121 mg/dL — ABNORMAL HIGH (ref 70–99)
Potassium: 4.4 mEq/L (ref 3.7–5.3)
Sodium: 139 mEq/L (ref 137–147)

## 2013-04-17 MED ORDER — FUROSEMIDE 40 MG PO TABS: 40.0000 mg | ORAL_TABLET | Freq: Every day | ORAL | Status: DC

## 2013-04-17 MED ORDER — FERROUS SULFATE 325 (65 FE) MG PO TABS: 325.0000 mg | ORAL_TABLET | Freq: Every day | ORAL | Status: DC

## 2013-04-17 MED ORDER — GLIPIZIDE ER 2.5 MG PO TB24: 2.5000 mg | ORAL_TABLET | Freq: Every day | ORAL | Status: DC

## 2013-04-17 MED ORDER — CYANOCOBALAMIN 1000 MCG PO TABS: 1000.0000 ug | ORAL_TABLET | ORAL | Status: DC

## 2013-04-17 NOTE — Care Management Note (Signed)
    Page 1 of 2   04/17/2013     1:46:59 PM   CARE MANAGEMENT NOTE 04/17/2013  Patient:  Brent, Luna   Account Number:  [id number]  Date Initiated:  04/17/2013  Documentation initiated by:  Shawnay Bramel  Subjective/Objective Assessment:   PT ADM ON 04/10/14 WITH AMS, UTI.  PTA, PT RESIDES AT [place].     Action/Plan:   PT/OT RECOMMENDING SNF PLACEMENT AT DC, AS PT QUITE WEAK AND NEEDS REHAB.  CSW CONSULTED TO FACILITATE DC TO SNF WHEN MEDICALLY STABLE   Anticipated DC Date:  04/17/2013   Anticipated DC Plan:  SKILLED NURSING FACILITY  In-house referral  Clinical Social Worker      DC Planning Services  CM consult      Choice offered to / List presented to:             Status of service:  Completed, signed off Medicare Important Message given?   (If response is "NO", the following Medicare IM given date fields will be blank) Date Medicare IM given:   Date Additional Medicare IM given:    Discharge Disposition:  [place]  Per UR Regulation:  Reviewed for med. necessity/level of care/duration of stay  If discussed at [place] of Stay Meetings, dates discussed:   04/16/2013    Comments:  04/17/12 Brent Hoback,RN,BSN [phone] PT DISCHARGED TO [place], PER CSW ARRANGEMENTS.  04/15/13 Brent Reinertsen,RN,BSN [phone] SPOKE WITH PT'S SON, [name], [name] BY PHONE TO [place].  SON STATES HE WANTS TO TAKE PT HOME, BUT KNOWS HE IS MUCH WEAKER THAN PRIOR TO ADMISSION. HE IS AGREEABLE TO PT BEING FAXED OUT FOR SNF PLACEMENT AS BACKUP PLAN.  HE PREFERS [facility] AS FIRST CHOICE, AS PT HAS BEEN THERE BEFORE.  CSW UPDATED ON CONVERSATION.

## 2013-04-17 NOTE — Discharge Summary (Signed)
Physician Discharge Summary  Patient ID: [name] MRN: [id number] DOB/AGE: [birth year] 80 y.o.  Admit date: 04/10/2013 Discharge date: 04/17/2013  Admission Diagnoses: UTI H. or fibrillation Type 2 diabetes mellitus Hypertension Congestive heart failure, diastolic dysfunction Acute on chronic kidney disease stage III Peripheral vascular disease Coronary artery disease Thrombocytopenia Anemia, multifactorial Hypokalemia   Discharge Diagnoses:  Principal Problem:   UTI (lower urinary tract infection) Active Problems:   Atrial fibrillation   T2DM (type 2 diabetes mellitus)   Hypertension   Anemia   Acute on chronic renal insufficiency   Type II or unspecified type diabetes mellitus with renal manifestations, not stated as uncontrolled(250.40)   Hypokalemia   PVD (peripheral vascular disease)   Chronic kidney disease, stage III (moderate)   Hyperlipidemia   Chronic diastolic heart failure   Discharged Condition: good  Hospital Course: The patient was admitted on 04/10/2013 with increased generalized weakness and a fall 2 days prior to admission. He is slightly more confusion compared to baseline and was started on Primaxin for urinalysis suggesting urinary tract infection. He was eventually switched over to Levaquin. Urine culture shows Enterobacter cloaca I which was pansensitive. The patient was treated with Lasix twice a day for some mild congestive heart failure. He developed an increasing creatinine up to 2.4 from baseline of about 1.6 from overdiuresis, ACE inhibitor and diuretics were held and he was given 2 days of IV fluids with drop in creatinine 1.1. He did have some mild thrombocytopenia with platelet count of approximately 100,000, heparin was discontinued. He also has chronic anemia with hemoglobin as low as 8.6 was restarted on iron and given one dose of Aranesp. His diabetes remained under good control, his glipizide dose was reduced with sugars running in the  low to mid 100s. Initially he was in atrial fibrillation and then converted to normal sinus rhythm with PACs. He was getting bradycardic with rates into the 50s and his metoprolol was discontinued. He'll be continued on a once daily dose Lasix.  Consults: None  Significant Diagnostic Studies: labs: ,Sodium 139, potassium 4.4, BUN 41, creatinine 1.1, glucose 121 at discharge CBC hemoglobin 8.6, MCV 100.8, WBC 6.8 platelet count 1 18,000 microbiology: urine culture: positive for Enterobacter cloacae Iand radiology: CXR: Mild cardiomegaly and increased interstitial markings, stable pleural plaques and CT scan: CT scan of brain and neck no acute disease  Treatments: IV hydration, antibiotics: Zosyn and Levaquin and cardiac meds: lisinopril (Prinivil), metoprolol and furosemide  Discharge Exam: Blood pressure 151/68, pulse 65, temperature 98.6 F (37 C), temperature source Oral, resp. rate 21, height 5\' 2"  (1.575 m), weight 76.5 kg (168 lb 10.4 oz), SpO2 93.00%. Resp: clear to auscultation bilaterally Cardio: regular rate and rhythm, S1, S2 normal, no murmur, click, rub or gallop Extremities: extremities normal, atraumatic, no cyanosis or edema  Disposition: 03-Skilled Nursing Facility   Future Appointments Provider Department Dept Phone   05/22/2013 10:30 AM Sueanne Margarita, MD [place] [phone]   06/07/2013 11:00 AM [facility] [address] [phone]   06/07/2013 12:00 PM Sharmon Leyden Nickel, NP [facility] [phone]       Medication List    STOP taking these medications       amLODipine 10 MG tablet  Commonly known as:  NORVASC     cyclobenzaprine 10 MG tablet  Commonly known as:  FLEXERIL     glipiZIDE 10 MG tablet  Commonly known as:  GLUCOTROL  Replaced by:  glipiZIDE 2.5 MG 24 hr tablet     metoprolol succinate 25 MG 24 hr tablet  Commonly known as:  TOPROL-XL     niacin 500 MG tablet       TAKE these medications       acetaminophen 325 MG tablet  Commonly known as:  TYLENOL  Take 650 mg by mouth every 4 (four) hours as needed for pain.     albuterol 108 (90 BASE) MCG/ACT inhaler  Commonly known as:  PROVENTIL HFA;VENTOLIN HFA  Inhale 2 puffs into the lungs every 6 (six) hours as needed for wheezing.     aspirin EC 81 MG tablet  Take 81 mg by mouth daily.     beta carotene w/minerals tablet  Take 1 tablet by mouth daily.     cilostazol 100 MG tablet  Commonly known as:  PLETAL  Take 100 mg by mouth 2 (two) times daily.     ferrous sulfate 325 (65 FE) MG tablet  Take 1 tablet (325 mg total) by mouth daily with breakfast.     furosemide 40 MG tablet  Commonly known as:  LASIX  Take 40 mg by mouth 1 (one) times daily.     glipiZIDE 2.5 MG 24 hr tablet  Commonly known as:  GLUCOTROL XL  Take 1 tablet (2.5 mg total) by mouth daily with breakfast.     isosorbide mononitrate 30 MG 24 hr tablet  Commonly known as:  IMDUR  Take 30 mg by mouth daily.     lisinopril 20 MG tablet  Commonly known as:  PRINIVIL,ZESTRIL  Take 20 mg by mouth daily.     multivitamin with minerals Tabs tablet  Take 1 tablet by mouth daily.     nitroGLYCERIN 0.4 MG SL tablet  Commonly known as:  NITROSTAT  Place 0.4 mg under the tongue every 5 (five) minutes as needed for chest pain.     tamsulosin 0.4 MG Caps capsule  Commonly known as:  FLOMAX  Take 0.4 mg by mouth daily after breakfast.     cyanocobalamin 1000 MCG tablet  Take 1 tablet (1,000 mcg total) by mouth 3 (three) times a week.                    Follow-up Information   Follow up with Irven Shelling, MD.   Specialty:  Internal Medicine   Contact information:   [address], [address] [place] [address] [phone]       Signed: Irven Shelling 04/17/2013, 7:24 AM

## 2013-04-17 NOTE — Progress Notes (Signed)
Clinical Social Worker facilitated patient discharge by contacting the patient, family and facility, [facility].  Patient's family agreeable to this plan and arranging transport via EMS . CSW will sign off, as social work intervention is no longer needed.  Jeanette Caprice, MSW, [place]

## 2013-04-17 NOTE — Progress Notes (Signed)
Clinical Social Work Department CLINICAL SOCIAL WORK PLACEMENT NOTE 04/17/2013  Patient:  [name], [name]  Account Number:  [id number] Admit date:  04/10/2013  Clinical Social Worker:  Megan Salon  Date/time:  04/15/2013 10:18 AM  Clinical Social Work is seeking post-discharge placement for this patient at the following level of care:   [place]   (*CSW will update this form in Epic as items are completed)   04/15/2013  Patient/family provided with [place] Department of Clinical Social Work's list of facilities offering this level of care within the geographic area requested by the patient (or if unable, by the patient's family).  04/15/2013  Patient/family informed of their freedom to choose among providers that offer the needed level of care, that participate in Medicare, Medicaid or managed care program needed by the patient, have an available bed and are willing to accept the patient.  04/15/2013  Patient/family informed of [facility] ownership interest in [facility], as well as of the fact that they are under no obligation to receive care at this facility.  PASARR submitted to EDS on 04/14/2013 PASARR number received from EDS on 04/14/2013  FL2 transmitted to all facilities in geographic area requested by pt/family on  04/15/2013 FL2 transmitted to all facilities within larger geographic area on   Patient informed that his/her managed care company has contracts with or will negotiate with  certain facilities, including the following:     Patient/family informed of bed offers received:  04/16/2013 Patient chooses bed at [place] Physician recommends and patient chooses bed at    Patient to be transferred to [facility] on  04/17/2013 Patient to be transferred to facility by EMS  The following physician request were entered in Epic:   Additional Comments:  Jeanette Caprice, MSW, [place]

## 2013-04-17 NOTE — Progress Notes (Signed)
Pt discharged to [facility] via non-emergency ambulance.  Nurse to nurse report given.  Caregiver to follow transport.

## 2013-04-26 ENCOUNTER — Non-Acute Institutional Stay (SKILLED_NURSING_FACILITY): Payer: Medicare HMO | Admitting: Internal Medicine

## 2013-04-26 DIAGNOSIS — I4891 Unspecified atrial fibrillation: Secondary | ICD-10-CM

## 2013-04-26 DIAGNOSIS — I5032 Chronic diastolic (congestive) heart failure: Secondary | ICD-10-CM

## 2013-04-26 DIAGNOSIS — E1129 Type 2 diabetes mellitus with other diabetic kidney complication: Secondary | ICD-10-CM

## 2013-04-26 DIAGNOSIS — I150 Renovascular hypertension: Secondary | ICD-10-CM

## 2013-04-28 DIAGNOSIS — I150 Renovascular hypertension: Secondary | ICD-10-CM | POA: Insufficient documentation
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
## 2013-04-29 ENCOUNTER — Telehealth: Payer: Self-pay

## 2013-04-29 DIAGNOSIS — I96 Gangrene, not elsewhere classified: Secondary | ICD-10-CM

## 2013-04-29 DIAGNOSIS — I70229 Atherosclerosis of native arteries of extremities with rest pain, unspecified extremity: Secondary | ICD-10-CM

## 2013-04-29 NOTE — Telephone Encounter (Signed)
rec'd phone call from [facility] regarding need for appt.  Returned call; spoke with the nursing supervisor, Sharyn Lull.  Reported pt. has small amt. of necrotic tissue on left 1st and 2nd toes, and redness of feet and heels.  States there are no open sores at this time.  Reports pt. c/o occas. leg pain.  Has hx. of Aortogram with CO2/ contrast left leg runoff; has documented severe tibial disease.  Will discuss with MD re: orders for any vascular testing with appt., and notify nursing home of appt.

## 2013-04-30 NOTE — Telephone Encounter (Signed)
Discussed pt's symptoms with Dr. Kellie Simmering.  Advised to schedule ABI's and office visit with Dr. Bridgett Larsson.  Will notify nursing home of appt date/time.

## 2013-04-30 NOTE — Telephone Encounter (Signed)
I spoke with [name] at [facility]- pt will be here at 10:30am on 1/23. They are aware that we will be working him in for this appointment with [place]. dpm

## 2013-05-02 ENCOUNTER — Encounter: Payer: Self-pay | Admitting: Vascular Surgery

## 2013-05-03 ENCOUNTER — Ambulatory Visit (HOSPITAL_COMMUNITY)
Admission: RE | Admit: 2013-05-03 | Discharge: 2013-05-03 | Disposition: A | Discharge status: Home / Self Care | Payer: Medicare HMO | Source: Ambulatory Visit | Attending: Vascular Surgery | Admitting: Vascular Surgery

## 2013-05-03 ENCOUNTER — Encounter: Payer: Self-pay | Admitting: Vascular Surgery

## 2013-05-03 ENCOUNTER — Ambulatory Visit (INDEPENDENT_AMBULATORY_CARE_PROVIDER_SITE_OTHER): Payer: Medicare HMO | Admitting: Vascular Surgery

## 2013-05-03 VITALS — BP 137/108 | HR 86 | Temp 97.7°F | Ht 62.0 in | Wt 168.0 lb

## 2013-05-03 DIAGNOSIS — I70229 Atherosclerosis of native arteries of extremities with rest pain, unspecified extremity: Secondary | ICD-10-CM

## 2013-05-03 DIAGNOSIS — R238 Other skin changes: Secondary | ICD-10-CM | POA: Insufficient documentation

## 2013-05-03 DIAGNOSIS — L98499 Non-pressure chronic ulcer of skin of other sites with unspecified severity: Secondary | ICD-10-CM

## 2013-05-03 DIAGNOSIS — I70299 Other atherosclerosis of native arteries of extremities, unspecified extremity: Principal | ICD-10-CM

## 2013-05-03 DIAGNOSIS — I70269 Atherosclerosis of native arteries of extremities with gangrene, unspecified extremity: Secondary | ICD-10-CM | POA: Insufficient documentation

## 2013-05-03 DIAGNOSIS — I96 Gangrene, not elsewhere classified: Secondary | ICD-10-CM | POA: Insufficient documentation

## 2013-05-03 DIAGNOSIS — L97509 Non-pressure chronic ulcer of other part of unspecified foot with unspecified severity: Secondary | ICD-10-CM

## 2013-05-03 DIAGNOSIS — L989 Disorder of the skin and subcutaneous tissue, unspecified: Secondary | ICD-10-CM | POA: Insufficient documentation

## 2013-05-03 DIAGNOSIS — I739 Peripheral vascular disease, unspecified: Secondary | ICD-10-CM | POA: Insufficient documentation
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
## 2013-05-09 ENCOUNTER — Encounter: Payer: Self-pay | Admitting: Vascular Surgery

## 2013-05-10 ENCOUNTER — Encounter: Payer: Self-pay | Admitting: Vascular Surgery

## 2013-05-10 ENCOUNTER — Ambulatory Visit (INDEPENDENT_AMBULATORY_CARE_PROVIDER_SITE_OTHER): Payer: Medicare HMO | Admitting: Vascular Surgery

## 2013-05-10 VITALS — BP 87/52 | HR 67 | Ht 62.0 in | Wt 168.0 lb

## 2013-05-10 DIAGNOSIS — I739 Peripheral vascular disease, unspecified: Secondary | ICD-10-CM

## 2013-05-10 DIAGNOSIS — L97509 Non-pressure chronic ulcer of other part of unspecified foot with unspecified severity: Secondary | ICD-10-CM

## 2013-05-10 DIAGNOSIS — I70299 Other atherosclerosis of native arteries of extremities, unspecified extremity: Principal | ICD-10-CM

## 2013-05-10 DIAGNOSIS — L98499 Non-pressure chronic ulcer of skin of other sites with unspecified severity: Secondary | ICD-10-CM
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
## 2013-05-14 ENCOUNTER — Non-Acute Institutional Stay (SKILLED_NURSING_FACILITY): Payer: Commercial Managed Care - HMO | Admitting: Adult Health

## 2013-05-14 DIAGNOSIS — I739 Peripheral vascular disease, unspecified: Secondary | ICD-10-CM

## 2013-05-14 DIAGNOSIS — I4891 Unspecified atrial fibrillation: Secondary | ICD-10-CM

## 2013-05-14 DIAGNOSIS — E1129 Type 2 diabetes mellitus with other diabetic kidney complication: Secondary | ICD-10-CM

## 2013-05-14 DIAGNOSIS — I5032 Chronic diastolic (congestive) heart failure: Secondary | ICD-10-CM

## 2013-05-14 DIAGNOSIS — I10 Essential (primary) hypertension: Secondary | ICD-10-CM

## 2013-05-17 ENCOUNTER — Other Ambulatory Visit: Payer: Self-pay

## 2013-05-21 ENCOUNTER — Encounter (HOSPITAL_COMMUNITY): Payer: Self-pay | Admitting: Pharmacy Technician

## 2013-05-22 ENCOUNTER — Other Ambulatory Visit: Payer: Self-pay

## 2013-05-22 ENCOUNTER — Inpatient Hospital Stay (HOSPITAL_COMMUNITY)
Admission: EM | Admit: 2013-05-22 | Discharge: 2013-05-27 | DRG: 683 | Disposition: A | Discharge status: Home / Self Care | Payer: Medicare HMO | Attending: Internal Medicine | Admitting: Internal Medicine

## 2013-05-22 ENCOUNTER — Encounter: Payer: Self-pay | Admitting: Cardiology

## 2013-05-22 ENCOUNTER — Ambulatory Visit (INDEPENDENT_AMBULATORY_CARE_PROVIDER_SITE_OTHER): Payer: Medicare HMO | Admitting: Cardiology

## 2013-05-22 ENCOUNTER — Emergency Department (HOSPITAL_COMMUNITY): Payer: Medicare HMO

## 2013-05-22 ENCOUNTER — Encounter (HOSPITAL_COMMUNITY): Payer: Self-pay | Admitting: Emergency Medicine

## 2013-05-22 VITALS — BP 83/49 | HR 56 | Ht 68.0 in

## 2013-05-22 DIAGNOSIS — I2510 Atherosclerotic heart disease of native coronary artery without angina pectoris: Secondary | ICD-10-CM | POA: Diagnosis present

## 2013-05-22 DIAGNOSIS — I998 Other disorder of circulatory system: Secondary | ICD-10-CM | POA: Diagnosis present

## 2013-05-22 DIAGNOSIS — I951 Orthostatic hypotension: Secondary | ICD-10-CM | POA: Diagnosis present

## 2013-05-22 DIAGNOSIS — I739 Peripheral vascular disease, unspecified: Secondary | ICD-10-CM

## 2013-05-22 DIAGNOSIS — I350 Nonrheumatic aortic (valve) stenosis: Secondary | ICD-10-CM

## 2013-05-22 DIAGNOSIS — N189 Chronic kidney disease, unspecified: Secondary | ICD-10-CM

## 2013-05-22 DIAGNOSIS — I498 Other specified cardiac arrhythmias: Secondary | ICD-10-CM | POA: Diagnosis present

## 2013-05-22 DIAGNOSIS — I509 Heart failure, unspecified: Secondary | ICD-10-CM | POA: Diagnosis present

## 2013-05-22 DIAGNOSIS — N390 Urinary tract infection, site not specified: Secondary | ICD-10-CM | POA: Diagnosis present

## 2013-05-22 DIAGNOSIS — N179 Acute kidney failure, unspecified: Principal | ICD-10-CM | POA: Diagnosis present

## 2013-05-22 DIAGNOSIS — E785 Hyperlipidemia, unspecified: Secondary | ICD-10-CM | POA: Diagnosis present

## 2013-05-22 DIAGNOSIS — I5032 Chronic diastolic (congestive) heart failure: Secondary | ICD-10-CM

## 2013-05-22 DIAGNOSIS — N183 Chronic kidney disease, stage 3 unspecified: Secondary | ICD-10-CM | POA: Diagnosis present

## 2013-05-22 DIAGNOSIS — E860 Dehydration: Secondary | ICD-10-CM | POA: Diagnosis present

## 2013-05-22 DIAGNOSIS — I10 Essential (primary) hypertension: Secondary | ICD-10-CM

## 2013-05-22 DIAGNOSIS — E441 Mild protein-calorie malnutrition: Secondary | ICD-10-CM | POA: Diagnosis present

## 2013-05-22 DIAGNOSIS — Z9861 Coronary angioplasty status: Secondary | ICD-10-CM

## 2013-05-22 DIAGNOSIS — I959 Hypotension, unspecified: Secondary | ICD-10-CM

## 2013-05-22 DIAGNOSIS — Z87891 Personal history of nicotine dependence: Secondary | ICD-10-CM

## 2013-05-22 DIAGNOSIS — I5043 Acute on chronic combined systolic (congestive) and diastolic (congestive) heart failure: Secondary | ICD-10-CM | POA: Diagnosis present

## 2013-05-22 DIAGNOSIS — N058 Unspecified nephritic syndrome with other morphologic changes: Secondary | ICD-10-CM | POA: Diagnosis present

## 2013-05-22 DIAGNOSIS — E119 Type 2 diabetes mellitus without complications: Secondary | ICD-10-CM | POA: Diagnosis present

## 2013-05-22 DIAGNOSIS — R112 Nausea with vomiting, unspecified: Secondary | ICD-10-CM

## 2013-05-22 DIAGNOSIS — E1129 Type 2 diabetes mellitus with other diabetic kidney complication: Secondary | ICD-10-CM | POA: Diagnosis present

## 2013-05-22 DIAGNOSIS — I359 Nonrheumatic aortic valve disorder, unspecified: Secondary | ICD-10-CM

## 2013-05-22 DIAGNOSIS — I129 Hypertensive chronic kidney disease with stage 1 through stage 4 chronic kidney disease, or unspecified chronic kidney disease: Secondary | ICD-10-CM | POA: Diagnosis present

## 2013-05-22 DIAGNOSIS — D638 Anemia in other chronic diseases classified elsewhere: Secondary | ICD-10-CM | POA: Diagnosis present

## 2013-05-22 DIAGNOSIS — I77810 Thoracic aortic ectasia: Secondary | ICD-10-CM | POA: Diagnosis present

## 2013-05-22 DIAGNOSIS — I5042 Chronic combined systolic (congestive) and diastolic (congestive) heart failure: Secondary | ICD-10-CM | POA: Diagnosis present

## 2013-05-22 DIAGNOSIS — I4891 Unspecified atrial fibrillation: Secondary | ICD-10-CM

## 2013-05-22 DIAGNOSIS — I77819 Aortic ectasia, unspecified site: Secondary | ICD-10-CM | POA: Diagnosis present

## 2013-05-22 DIAGNOSIS — Z7982 Long term (current) use of aspirin: Secondary | ICD-10-CM

## 2013-05-22 HISTORY — DX: Unspecified convulsions: R56.9

## 2013-05-22 HISTORY — DX: Unspecified osteoarthritis, unspecified site: M19.90

## 2013-05-22 HISTORY — DX: Unspecified dementia, unspecified severity, without behavioral disturbance, psychotic disturbance, mood disturbance, and anxiety: F03.90

## 2013-05-22 HISTORY — DX: Peripheral vascular disease, unspecified: I73.9

## 2013-05-22 HISTORY — DX: Chronic kidney disease, stage 3 (moderate): N18.3

## 2013-05-22 HISTORY — DX: Chronic kidney disease, stage 3 unspecified: N18.30

## 2013-05-22 HISTORY — DX: Type 2 diabetes mellitus without complications: E11.9

## 2013-05-22 LAB — CBC WITH DIFFERENTIAL/PLATELET
BASOS ABS: 0 10*3/uL (ref 0.0–0.1)
Basophils Relative: 0 % (ref 0–1)
Eosinophils Absolute: 0.1 10*3/uL (ref 0.0–0.7)
Eosinophils Relative: 1 % (ref 0–5)
HCT: 29 % — ABNORMAL LOW (ref 39.0–52.0)
HEMOGLOBIN: 9.6 g/dL — AB (ref 13.0–17.0)
LYMPHS ABS: 0.6 10*3/uL — AB (ref 0.7–4.0)
LYMPHS PCT: 9 % — AB (ref 12–46)
MCH: 33.1 pg (ref 26.0–34.0)
MCHC: 33.1 g/dL (ref 30.0–36.0)
MCV: 100 fL (ref 78.0–100.0)
MONO ABS: 0.6 10*3/uL (ref 0.1–1.0)
MONOS PCT: 9 % (ref 3–12)
NEUTROS ABS: 5.6 10*3/uL (ref 1.7–7.7)
Neutrophils Relative %: 80 % — ABNORMAL HIGH (ref 43–77)
Platelets: 138 10*3/uL — ABNORMAL LOW (ref 150–400)
RBC: 2.9 MIL/uL — AB (ref 4.22–5.81)
RDW: 16.1 % — AB (ref 11.5–15.5)
WBC: 7 10*3/uL (ref 4.0–10.5)

## 2013-05-22 LAB — URINE MICROSCOPIC-ADD ON

## 2013-05-22 LAB — URINALYSIS, ROUTINE W REFLEX MICROSCOPIC
BILIRUBIN URINE: NEGATIVE
Glucose, UA: NEGATIVE mg/dL
Hgb urine dipstick: NEGATIVE
KETONES UR: NEGATIVE mg/dL
Nitrite: NEGATIVE
PROTEIN: NEGATIVE mg/dL
Specific Gravity, Urine: 1.01 (ref 1.005–1.030)
UROBILINOGEN UA: 0.2 mg/dL (ref 0.0–1.0)
pH: 8.5 — ABNORMAL HIGH (ref 5.0–8.0)

## 2013-05-22 LAB — GLUCOSE, CAPILLARY: GLUCOSE-CAPILLARY: 193 mg/dL — AB (ref 70–99)

## 2013-05-22 LAB — COMPREHENSIVE METABOLIC PANEL
ALT: 12 U/L (ref 0–53)
AST: 18 U/L (ref 0–37)
Albumin: 2.7 g/dL — ABNORMAL LOW (ref 3.5–5.2)
Alkaline Phosphatase: 110 U/L (ref 39–117)
BUN: 50 mg/dL — ABNORMAL HIGH (ref 6–23)
CHLORIDE: 104 meq/L (ref 96–112)
CO2: 20 meq/L (ref 19–32)
CREATININE: 2.48 mg/dL — AB (ref 0.50–1.35)
Calcium: 9.1 mg/dL (ref 8.4–10.5)
GFR, EST AFRICAN AMERICAN: 24 mL/min — AB (ref >90)
GFR, EST NON AFRICAN AMERICAN: 21 mL/min — AB (ref >90)
GLUCOSE: 208 mg/dL — AB (ref 70–99)
Potassium: 4 mEq/L (ref 3.7–5.3)
Sodium: 138 mEq/L (ref 137–147)
Total Bilirubin: 0.3 mg/dL (ref 0.3–1.2)
Total Protein: 7.3 g/dL (ref 6.0–8.3)

## 2013-05-22 LAB — LIPASE, BLOOD: Lipase: 32 U/L (ref 11–59)

## 2013-05-22 LAB — LACTIC ACID, PLASMA: LACTIC ACID, VENOUS: 2 mmol/L (ref 0.5–2.2)

## 2013-05-22 LAB — TROPONIN I: Troponin I: <0.3 ng/mL (ref <0.30)

## 2013-05-22 MED ORDER — ASPIRIN EC 81 MG PO TBEC
81.0000 mg | DELAYED_RELEASE_TABLET | Freq: Every day | ORAL | Status: DC
  Administered 2013-05-23 – 2013-05-27 (×5): 81 mg via ORAL
  Filled 2013-05-22 (×5): qty 1

## 2013-05-22 MED ORDER — INSULIN ASPART 100 UNIT/ML ~~LOC~~ SOLN
0.0000 [IU] | Freq: Three times a day (TID) | SUBCUTANEOUS | Status: DC
  Administered 2013-05-23: 2 [IU] via SUBCUTANEOUS
  Administered 2013-05-23: 1 [IU] via SUBCUTANEOUS
  Administered 2013-05-24 – 2013-05-25 (×4): 2 [IU] via SUBCUTANEOUS
  Administered 2013-05-26: 1 [IU] via SUBCUTANEOUS
  Administered 2013-05-26 – 2013-05-27 (×2): 2 [IU] via SUBCUTANEOUS
  Administered 2013-05-27: 1 [IU] via SUBCUTANEOUS

## 2013-05-22 MED ORDER — ADULT MULTIVITAMIN W/MINERALS CH
1.0000 | ORAL_TABLET | Freq: Every day | ORAL | Status: DC
  Administered 2013-05-23 – 2013-05-27 (×5): 1 via ORAL
  Filled 2013-05-22 (×5): qty 1

## 2013-05-22 MED ORDER — OCUVITE PO TABS
1.0000 | ORAL_TABLET | Freq: Every day | ORAL | Status: DC
  Administered 2013-05-23 – 2013-05-27 (×5): 1 via ORAL
  Filled 2013-05-22 (×6): qty 1

## 2013-05-22 MED ORDER — ONDANSETRON HCL 4 MG PO TABS: 4.0000 mg | ORAL_TABLET | Freq: Four times a day (QID) | ORAL | Status: DC | PRN

## 2013-05-22 MED ORDER — SODIUM CHLORIDE 0.9 % IV SOLN
INTRAVENOUS | Status: AC
  Administered 2013-05-23: 02:00:00 via INTRAVENOUS

## 2013-05-22 MED ORDER — ACETAMINOPHEN 325 MG PO TABS: 650.0000 mg | ORAL_TABLET | Freq: Four times a day (QID) | ORAL | Status: DC | PRN

## 2013-05-22 MED ORDER — SODIUM CHLORIDE 0.9 % IV BOLUS (SEPSIS)
250.0000 mL | Freq: Once | INTRAVENOUS | Status: AC
  Administered 2013-05-22: 250 mL via INTRAVENOUS

## 2013-05-22 MED ORDER — CILOSTAZOL 100 MG PO TABS
100.0000 mg | ORAL_TABLET | Freq: Two times a day (BID) | ORAL | Status: DC
  Administered 2013-05-22 – 2013-05-27 (×10): 100 mg via ORAL
  Filled 2013-05-22 (×11): qty 1

## 2013-05-22 MED ORDER — ALBUTEROL SULFATE (2.5 MG/3ML) 0.083% IN NEBU: 2.5000 mg | INHALATION_SOLUTION | Freq: Every day | RESPIRATORY_TRACT | Status: DC | PRN

## 2013-05-22 MED ORDER — TAMSULOSIN HCL 0.4 MG PO CAPS
0.4000 mg | ORAL_CAPSULE | Freq: Every day | ORAL | Status: DC
  Administered 2013-05-23 – 2013-05-27 (×5): 0.4 mg via ORAL
  Filled 2013-05-22 (×6): qty 1

## 2013-05-22 MED ORDER — SODIUM CHLORIDE 0.9 % IV SOLN
INTRAVENOUS | Status: DC
  Administered 2013-05-22: 15:00:00 via INTRAVENOUS

## 2013-05-22 MED ORDER — ACETAMINOPHEN 650 MG RE SUPP: 650.0000 mg | Freq: Four times a day (QID) | RECTAL | Status: DC | PRN

## 2013-05-22 MED ORDER — ALBUTEROL SULFATE HFA 108 (90 BASE) MCG/ACT IN AERS: 2.0000 | INHALATION_SPRAY | Freq: Every day | RESPIRATORY_TRACT | Status: DC | PRN

## 2013-05-22 MED ORDER — ONDANSETRON HCL 4 MG/2ML IJ SOLN
4.0000 mg | Freq: Four times a day (QID) | INTRAMUSCULAR | Status: DC | PRN
Start: 2013-05-22 — End: 2013-05-27

## 2013-05-22 MED ORDER — SODIUM CHLORIDE 0.9 % IV SOLN: INTRAVENOUS | Status: DC

## 2013-05-22 MED ORDER — FERROUS SULFATE 325 (65 FE) MG PO TABS
325.0000 mg | ORAL_TABLET | Freq: Every day | ORAL | Status: DC
  Administered 2013-05-23 – 2013-05-27 (×5): 325 mg via ORAL
  Filled 2013-05-22 (×6): qty 1

## 2013-05-22 MED ORDER — HEPARIN SODIUM (PORCINE) 5000 UNIT/ML IJ SOLN
5000.0000 [IU] | Freq: Three times a day (TID) | INTRAMUSCULAR | Status: DC
  Administered 2013-05-22 – 2013-05-27 (×14): 5000 [IU] via SUBCUTANEOUS
  Filled 2013-05-22 (×17): qty 1

## 2013-05-22 MED ORDER — DEXTROSE 5 % IV SOLN
1.0000 g | Freq: Once | INTRAVENOUS | Status: DC
  Administered 2013-05-22: 1 g via INTRAVENOUS
  Filled 2013-05-22: qty 10

## 2013-05-22 NOTE — Progress Notes (Signed)
   Pt is scheduled for angiography tomorrow in OR due to inability to cooperate due to some degree of dementia.  Obviously his medical condition has deteriorated, so I suspect the procedure will be canceled tomorrow.  Will re-evaluate tomorrow AM.  Please keep NPO after mid-night.  If renal function is not favorable in AM, will cancel the procedure.   Adele Barthel, MD [facility] Office: [phone] Pager: [phone]  05/22/2013, 5:31 PM

## 2013-05-22 NOTE — Progress Notes (Signed)
Pt was brought in with caregiver. She does not know his medication and the pt did not know his meds. Had to mark them all unknown.

## 2013-05-22 NOTE — Patient Instructions (Signed)
Your physician has recommended you make the following change in your medication:   1. Stop Lisinopril.  2. Stop lasix.  Your physician recommends that you schedule a follow-up appointment in: 1 week with Richardson Dopp, PA.  Your physician wants you to follow-up in: 3 months with Dr. Radford Pax.  You will receive a reminder letter in the mail two months in advance. If you don't receive a letter, please call our office to schedule the follow-up appointment.
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
## 2013-05-22 NOTE — Progress Notes (Signed)
[address], [place], [place]  [address] Phone: [phone] Fax:  [phone]  Date:  05/22/2013   ID:  [name], DOB 02-08-1918, MRN [id number]  PCP:  Irven Shelling, MD  Cardiologist:  Fransico Him, MD     History of Present Illness: [name] is a 80 y.o. male with a history of ASCAD, chronic combined systolic/diastolic CHF, mild DCM with EF 45-50%, afib, chronic kidney disease, HTN, dyslipidemia, mild AS and chronic LE edema who presents today for followup. He is doing well. He denies any chest pain, SOB, DOE, dizziness, palpitations or syncope. He occasionally has some LE edema. He was recently in the hospital for a UTI.  His HR was slow in the hospital and his metoprolol was stopped.  His amlodipine was also stopped.      Wt Readings from Last 3 Encounters:  05/10/13 168 lb (76.204 kg)  05/03/13 168 lb (76.204 kg)  04/12/13 168 lb 10.4 oz (76.5 kg)     Past Medical History  Diagnosis Date  . Macular degeneration   . Bladder tumor 1998  . Peripheral vascular disease   . Anemia   . TIA (transient ischemic attack)   . Low back pain   . Pneumonia Sept. 2014  . Chronic kidney disease     Stage III  . UTI (lower urinary tract infection)   . Coronary artery disease 02/2005    PCI with DES of RCA and LAD with normal LVF  . Diabetes mellitus   . Hypertension   . Chronic atrial fibrillation   . Carotid artery occlusion     s/p right CEA  . Hyperlipidemia   . Edema extremities   . CHF (congestive heart failure) Sept. 2014    EF 45-50%  . Bladder cancer 2001  . Altered mental status     admission 04/10/2013    Current Outpatient Prescriptions  Medication Sig Dispense Refill  . acetaminophen (TYLENOL) 325 MG tablet Take 650 mg by mouth every 4 (four) hours as needed for mild pain.       Marland Kitchen albuterol (PROVENTIL HFA;VENTOLIN HFA) 108 (90 BASE) MCG/ACT inhaler Inhale 2 puffs into the lungs daily as needed for wheezing or shortness of breath.       Marland Kitchen aspirin EC 81 MG tablet Take 81 mg by mouth daily.       . beta carotene w/minerals (OCUVITE) tablet Take 1 tablet by mouth daily.       . cilostazol (PLETAL) 100 MG tablet Take 100 mg by mouth 2 (two) times daily.      . cyanocobalamin 1000 MCG tablet Take 1,000 mcg by mouth 3 (three) times a week. Take 1000 mcg on Monday, Wednesday, and Friday      . doxycycline (DORYX) 100 MG EC tablet Take 100 mg by mouth 2 (two) times daily.      . ferrous sulfate 325 (65 FE) MG tablet Take 325 mg by mouth daily with breakfast.      . furosemide (LASIX) 40 MG tablet Take 40 mg by mouth daily.      Marland Kitchen glipiZIDE (GLUCOTROL XL) 2.5 MG 24 hr tablet Take 2.5 mg by mouth daily with breakfast.      . isosorbide mononitrate (IMDUR) 30 MG 24 hr tablet Take 30 mg by mouth daily.      Marland Kitchen lisinopril (PRINIVIL,ZESTRIL) 20 MG tablet Take 20 mg by mouth daily.      . Multiple Vitamin (MULTIVITAMIN WITH MINERALS) TABS  tablet Take 1 tablet by mouth daily.      . nitroGLYCERIN (NITROSTAT) 0.4 MG SL tablet Place 0.4 mg under the tongue every 5 (five) minutes as needed for chest pain.      . tamsulosin (FLOMAX) 0.4 MG CAPS Take 0.4 mg by mouth daily after breakfast.       No current facility-administered medications for this visit.    Allergies:    Allergies  Allergen Reactions  . Penicillins Swelling  . Sulfa Antibiotics Other (See Comments)    unknown    Social History:  The patient  reports that he quit smoking about 74 years ago. His smoking use included Cigarettes. He smoked 0.00 packs per day. He has never used smokeless tobacco. He reports that he does not drink alcohol or use illicit drugs.   Family History:  The patient's family history includes Heart disease in his brother; Stroke in his brother.   ROS:  Please see the history of present illness.      All other systems reviewed and negative.   PHYSICAL EXAM: VS:  BP 83/49  Pulse 56  Ht 5\' 8"  (1.727 m) Well nourished, well developed, in no acute  distress HEENT: normal Neck: no JVD Cardiac:  normal S1, S2; RRR; no murmur Lungs:  clear to auscultation bilaterally, no wheezing, rhonchi or rales Abd: soft, nontender, no hepatomegaly Ext: no edema Skin: warm and dry Neuro:  CNs 2-12 intact, no focal abnormalities noted     ASSESSMENT AND PLAN:  1. Chronic diastolic CHF - last echo 06/7104 EF 45-50% - appears compensated- I will stop his ACE I and Lasix for now since he is hypotensive and reassess in 1 week 2. Mild AS       3.   HTN - BP now hypotensive but he is asymptomatic - stop Lisinopril 4. Chronic AF  - metoprolol stopped in the hospital due to bradycardia - continue ASA - felt not a coumadin candidate due to advanced age and debilitated state  5. ASCAD with no angina - continue ASA/Imdur  6. dyslipdemia - continue Niacin  7. Chronic LE edema - no edema on exam today - stop Lasix due to low BP  8. Mild AS with mildly dilated aortic root    Followup with my PA in 1 week to reassess BP Followup with me 3 months  Signed, Fransico Him, MD 05/22/2013 11:06 AM

## 2013-05-22 NOTE — ED Notes (Signed)
Pt from non emergency medical transport vehicle, c/o emesis. Pt was returning from cardiologist appt. And had one episode of vomtting. Per EMS pt was initially hypotensive and pale. Pt recieves 4 mg zofran 100cc ns. Pt is at baseline neuro per cargiver

## 2013-05-22 NOTE — ED Notes (Signed)
Patient stated he was feeling to dizzy to stand to do standing orthostatic VS.

## 2013-05-22 NOTE — ED Notes (Signed)
Attempted to I/O cath was not successful.  Patient does have a large enough opening to cath.  Noticed Dr. Thurnell Garbe.  Was able to obtain 1 cc of urine while pressing on head of penis.

## 2013-05-23 ENCOUNTER — Encounter (HOSPITAL_COMMUNITY)
Admission: EM | Disposition: A | Discharge status: Home / Self Care | Payer: Self-pay | Source: Home / Self Care | Attending: Internal Medicine

## 2013-05-23 ENCOUNTER — Encounter (HOSPITAL_COMMUNITY): Payer: Self-pay | Admitting: Certified Registered Nurse Anesthetist

## 2013-05-23 ENCOUNTER — Ambulatory Visit (HOSPITAL_COMMUNITY): Admission: RE | Admit: 2013-05-23 | Payer: Medicare HMO | Source: Ambulatory Visit | Admitting: Vascular Surgery

## 2013-05-23 DIAGNOSIS — L98499 Non-pressure chronic ulcer of skin of other sites with unspecified severity: Secondary | ICD-10-CM

## 2013-05-23 DIAGNOSIS — I739 Peripheral vascular disease, unspecified: Secondary | ICD-10-CM

## 2013-05-23 LAB — URINE CULTURE: Colony Count: >=100000

## 2013-05-23 LAB — CBC
HCT: 28 % — ABNORMAL LOW (ref 39.0–52.0)
Hemoglobin: 9.2 g/dL — ABNORMAL LOW (ref 13.0–17.0)
MCH: 32.6 pg (ref 26.0–34.0)
MCHC: 32.9 g/dL (ref 30.0–36.0)
MCV: 99.3 fL (ref 78.0–100.0)
PLATELETS: 147 10*3/uL — AB (ref 150–400)
RBC: 2.82 MIL/uL — ABNORMAL LOW (ref 4.22–5.81)
RDW: 16.2 % — AB (ref 11.5–15.5)
WBC: 8.7 10*3/uL (ref 4.0–10.5)

## 2013-05-23 LAB — GLUCOSE, CAPILLARY
GLUCOSE-CAPILLARY: 173 mg/dL — AB (ref 70–99)
Glucose-Capillary: 120 mg/dL — ABNORMAL HIGH (ref 70–99)
Glucose-Capillary: 158 mg/dL — ABNORMAL HIGH (ref 70–99)
Glucose-Capillary: 127 mg/dL — ABNORMAL HIGH (ref 70–99)

## 2013-05-23 LAB — BASIC METABOLIC PANEL
BUN: 47 mg/dL — ABNORMAL HIGH (ref 6–23)
CO2: 20 mEq/L (ref 19–32)
Calcium: 9.1 mg/dL (ref 8.4–10.5)
Chloride: 106 mEq/L (ref 96–112)
Creatinine, Ser: 2.26 mg/dL — ABNORMAL HIGH (ref 0.50–1.35)
GFR calc Af Amer: 27 mL/min — ABNORMAL LOW (ref >90)
GFR calc non Af Amer: 23 mL/min — ABNORMAL LOW (ref >90)
Glucose, Bld: 135 mg/dL — ABNORMAL HIGH (ref 70–99)
Potassium: 3.9 mEq/L (ref 3.7–5.3)
Sodium: 139 mEq/L (ref 137–147)

## 2013-05-23 LAB — HEMOGLOBIN A1C
Hgb A1c MFr Bld: 6.9 % — ABNORMAL HIGH (ref <5.7)
Mean Plasma Glucose: 151 mg/dL — ABNORMAL HIGH (ref <117)

## 2013-05-23 SURGERY — ANGIOGRAM, LOWER EXTREMITY
Anesthesia: Monitor Anesthesia Care

## 2013-05-23 MED ORDER — DEXTROSE 5 % IV SOLN
1.0000 g | Freq: Every day | INTRAVENOUS | Status: DC
  Administered 2013-05-23 – 2013-05-25 (×3): 1 g via INTRAVENOUS
  Filled 2013-05-23 (×4): qty 10

## 2013-05-23 MED ORDER — SODIUM CHLORIDE 0.9 % IV SOLN
INTRAVENOUS | Status: DC
  Administered 2013-05-23 (×2): via INTRAVENOUS

## 2013-05-23 SURGICAL SUPPLY — 57 items
BANDAGE ELASTIC 4 VELCRO ST LF (GAUZE/BANDAGES/DRESSINGS) IMPLANT
BANDAGE ESMARK 6X9 LF (GAUZE/BANDAGES/DRESSINGS) IMPLANT
BNDG CMPR 9X6 STRL LF SNTH (GAUZE/BANDAGES/DRESSINGS)
BNDG ESMARK 6X9 LF (GAUZE/BANDAGES/DRESSINGS)
CANISTER SUCTION 2500CC (MISCELLANEOUS) ×4 IMPLANT
CLIP TI MEDIUM 24 (CLIP) ×4 IMPLANT
CLIP TI WIDE RED SMALL 24 (CLIP) ×4 IMPLANT
COVER PROBE W GEL 5X96 (DRAPES) ×4 IMPLANT
COVER SURGICAL LIGHT HANDLE (MISCELLANEOUS) ×4 IMPLANT
CUFF TOURNIQUET SINGLE 24IN (TOURNIQUET CUFF) IMPLANT
CUFF TOURNIQUET SINGLE 34IN LL (TOURNIQUET CUFF) IMPLANT
CUFF TOURNIQUET SINGLE 44IN (TOURNIQUET CUFF) IMPLANT
DRAIN CHANNEL 15F RND FF W/TCR (WOUND CARE) IMPLANT
DRAPE C-ARM 42X72 X-RAY (DRAPES) ×4 IMPLANT
DRAPE WARM FLUID 44X44 (DRAPE) ×4 IMPLANT
DRSG COVADERM 4X10 (GAUZE/BANDAGES/DRESSINGS) IMPLANT
DRSG COVADERM 4X8 (GAUZE/BANDAGES/DRESSINGS) IMPLANT
ELECT REM PT RETURN 9FT ADLT (ELECTROSURGICAL) ×3
ELECTRODE REM PT RTRN 9FT ADLT (ELECTROSURGICAL) ×2 IMPLANT
EVACUATOR SILICONE 100CC (DRAIN) IMPLANT
GLOVE BIO SURGEON STRL SZ7 (GLOVE) ×4 IMPLANT
GLOVE BIOGEL PI IND STRL 7.5 (GLOVE) ×2 IMPLANT
GLOVE BIOGEL PI INDICATOR 7.5 (GLOVE) ×2
GOWN STRL REUS W/ TWL LRG LVL3 (GOWN DISPOSABLE) ×6 IMPLANT
GOWN STRL REUS W/TWL LRG LVL3 (GOWN DISPOSABLE) ×9
INSERT FOGARTY SM (MISCELLANEOUS) IMPLANT
KIT BASIN OR (CUSTOM PROCEDURE TRAY) ×4 IMPLANT
KIT ROOM TURNOVER OR (KITS) ×4 IMPLANT
MARKER GRAFT CORONARY BYPASS (MISCELLANEOUS) IMPLANT
NS IRRIG 1000ML POUR BTL (IV SOLUTION) ×8 IMPLANT
PACK PERIPHERAL VASCULAR (CUSTOM PROCEDURE TRAY) ×4 IMPLANT
PAD ARMBOARD 7.5X6 YLW CONV (MISCELLANEOUS) ×8 IMPLANT
PADDING CAST COTTON 6X4 STRL (CAST SUPPLIES) IMPLANT
SET MICROPUNCTURE 5F STIFF (MISCELLANEOUS) IMPLANT
SPONGE SURGIFOAM ABS GEL 100 (HEMOSTASIS) IMPLANT
STAPLER VISISTAT 35W (STAPLE) IMPLANT
STOPCOCK 4 WAY LG BORE MALE ST (IV SETS) IMPLANT
SUT ETHILON 3 0 PS 1 (SUTURE) IMPLANT
SUT GORETEX 5 0 TT13 24 (SUTURE) IMPLANT
SUT GORETEX 6.0 TT13 (SUTURE) IMPLANT
SUT MNCRL AB 4-0 PS2 18 (SUTURE) ×12 IMPLANT
SUT PROLENE 5 0 C 1 24 (SUTURE) ×4 IMPLANT
SUT PROLENE 6 0 BV (SUTURE) ×4 IMPLANT
SUT PROLENE 7 0 BV 1 (SUTURE) IMPLANT
SUT SILK 2 0 FS (SUTURE) ×4 IMPLANT
SUT SILK 3 0 (SUTURE)
SUT SILK 3-0 18XBRD TIE 12 (SUTURE) IMPLANT
SUT VIC AB 2-0 CT1 27 (SUTURE) ×6
SUT VIC AB 2-0 CT1 TAPERPNT 27 (SUTURE) ×4 IMPLANT
SUT VIC AB 3-0 SH 27 (SUTURE) ×9
SUT VIC AB 3-0 SH 27X BRD (SUTURE) ×6 IMPLANT
TOWEL OR 17X24 6PK STRL BLUE (TOWEL DISPOSABLE) ×8 IMPLANT
TOWEL OR 17X26 10 PK STRL BLUE (TOWEL DISPOSABLE) ×8 IMPLANT
TRAY FOLEY CATH 16FRSI W/METER (SET/KITS/TRAYS/PACK) ×4 IMPLANT
TUBING EXTENTION W/L.L. (IV SETS) IMPLANT
UNDERPAD 30X30 INCONTINENT (UNDERPADS AND DIAPERS) ×4 IMPLANT
WATER STERILE IRR 1000ML POUR (IV SOLUTION) ×4 IMPLANT

## 2013-05-23 NOTE — Progress Notes (Signed)
   Daily Progress Note  Assessment/Planning: Acute on chronic renal insufficiency, Left foot critical limb ischemia   Left toes look much better so I would postpone any further procedures  In this patient with significant cognitive limits and elevated age, I feel medical mgmt would better serve this patient  Subjective    No complaints  Objective Filed Vitals:   05/22/13 1957 05/23/13 0518 05/23/13 0520 05/23/13 0525  BP: 136/65 144/73 139/64 106/54  Pulse: 105 98 87 87  Temp: 97.7 F (36.5 C) 98.5 F (36.9 C)    TempSrc: Oral Oral    Resp: 18 20 20    Height:      Weight:    206 lb 8 oz (93.668 kg)  SpO2: 93% 95% 95% 95%    Intake/Output Summary (Last 24 hours) at 05/23/13 0904 Last data filed at 05/22/13 1837  Gross per 24 hour  Intake  287.5 ml  Output      0 ml  Net  287.5 ml    PULM  CTAB CV  RRR GI  soft, NTND VASC  L foot warm, black eschar mostly resolved, some cyanotic hue to toes  Laboratory CBC    Component Value Date/Time   WBC 8.7 05/23/2013 0533   HGB 9.2* 05/23/2013 0533   HCT 28.0* 05/23/2013 0533   PLT 147* 05/23/2013 0533    BMET    Component Value Date/Time   NA 139 05/23/2013 0533   K 3.9 05/23/2013 0533   CL 106 05/23/2013 0533   CO2 20 05/23/2013 0533   GLUCOSE 135* 05/23/2013 0533   BUN 47* 05/23/2013 0533   CREATININE 2.26* 05/23/2013 0533   CALCIUM 9.1 05/23/2013 0533   GFRNONAA 23* 05/23/2013 0533   GFRAA 27* 05/23/2013 0533    Adele Barthel, MD [facility]: [phone] Pager: [phone]  05/23/2013, 9:04 AM

## 2013-05-23 NOTE — Progress Notes (Signed)
Patient incontinent and has urinary leakage. Condom cath applied, tegaderm used to prevent condom cath from sliding off.  Patient daughter Maudie Mercury called, updated her on patient's status. Patient also confused this morning. Asked if he was in the hospital in [place]. Patient was able to verify name and date of birth. Despite the confusion, patient pleasant and compliant.  Elmarie Shiley R

## 2013-05-23 NOTE — Evaluation (Signed)
Physical Therapy Evaluation Patient Details Name: [name] MRN: [id number] DOB: 12/20/1917 Today's Date: 05/23/2013 Time: 0479-9872 PT Time Calculation (min): 20 min  PT Assessment / Plan / Recommendation History of Present Illness  pt admitted for Acute on chronic renal insufficiency, Left foot critical limb ischemia  Clinical Impression  Pt is currently supervision/min A with mobility and transfers and gait.  Pt's son states this is his prior level of function and that he feels comfortable taking pt back home at this level.  Pt will benefit from resuming HHPT that he was receiving prior to admission.  No further acute PT needs identified.    PT Assessment  All further PT needs can be met in the next venue of care    Follow Up Recommendations  Home health PT    Does the patient have the potential to tolerate intense rehabilitation      Barriers to Discharge        Equipment Recommendations  None recommended by PT    Recommendations for Other Services     Frequency      Precautions / Restrictions Precautions Precautions: Fall Precaution Comments: bed alarm Restrictions Weight Bearing Restrictions: No   Pertinent Vitals/Pain No c/o pain.  spO2 98% on room air after gait      Mobility  Bed Mobility Overal bed mobility: Needs Assistance Bed Mobility: Supine to Sit;Sit to Supine Supine to sit: Supervision Sit to supine: Min assist General bed mobility comments: min A for scooting up in bed, increased time and cuing for all mobility Transfers Overall transfer level: Needs assistance Equipment used: Rolling walker (2 wheeled) Transfers: Sit to/from Stand Sit to Stand: Supervision General transfer comment: pt requires increased time and cuing for hand placement but is able to stand without physical assist Ambulation/Gait Ambulation/Gait assistance: Supervision Ambulation Distance (Feet): 150 Feet Assistive device: Rolling walker (2 wheeled) Gait velocity:  decreased General Gait Details: pt with forward flexed posture, cues to stay close to RW. pt's son states this is the same as prior level of function    Exercises     PT Diagnosis: Generalized weakness;Difficulty walking  PT Problem List: Decreased strength;Decreased mobility;Decreased balance;Decreased activity tolerance;Decreased knowledge of use of DME;Cardiopulmonary status limiting activity PT Treatment Interventions:       PT Goals(Current goals can be found in the care plan section) Acute Rehab PT Goals PT Goal Formulation: No goals set, d/c therapy  Visit Information  Last PT Received On: 05/23/13 Assistance Needed: +1 History of Present Illness: pt admitted for Acute on chronic renal insufficiency, Left foot critical limb ischemia       Prior Functioning  Home Living Family/patient expects to be discharged to:: Private residence Living Arrangements: Children Available Help at Discharge: Family;Personal care attendant;Available 24 hours/day Type of Home: House Home Access: Stairs to enter [employer] of Steps: 4 Entrance Stairs-Rails: Right;Can reach both;Left Home Layout: One level Home Equipment: Walker - 2 wheels;Bedside commode;Wheelchair - manual Prior Function Level of Independence: Needs assistance Comments: pt's son stated he is able go gait in home with close supervision and RW, needs assistance with ADLs Communication Communication: [facility]    Cognition  Cognition Arousal/Alertness: Awake/alert Behavior During Therapy: Flat affect Overall Cognitive Status:  (son states pt has been confused, ? due to UTI)    Extremity/Trunk Assessment Upper Extremity Assessment Upper Extremity Assessment: Generalized weakness Lower Extremity Assessment Lower Extremity Assessment: Generalized weakness Cervical / Trunk Assessment Cervical / Trunk Assessment: Kyphotic   Balance  End of Session PT - End of Session Equipment Utilized During Treatment: Gait  belt Activity Tolerance: Patient tolerated treatment well Patient left: in bed;with call bell/phone within reach;with bed alarm set;with nursing/sitter in room;with family/visitor present Nurse Communication: Mobility status  GP Functional Limitation: Mobility: Walking and moving around Mobility: Walking and Moving Around Current Status ([id number]): At least 1 percent but less than 20 percent impaired, limited or restricted Mobility: Walking and Moving Around Goal Status [phone]): At least 1 percent but less than 20 percent impaired, limited or restricted Mobility: Walking and Moving Around Discharge Status [phone]): At least 1 percent but less than 20 percent impaired, limited or restricted   [facility] 05/23/2013, 11:39 AM

## 2013-05-23 NOTE — Progress Notes (Signed)
Subjective: Pt awake - confused BP ok  Objective: Vital signs in last 24 hours: Temp:  [97.7 F (36.5 C)-98.5 F (36.9 C)] 98.5 F (36.9 C) (02/12 0518) Pulse Rate:  [53-123] 87 (02/12 0525) Resp:  [18-26] 20 (02/12 0520) BP: (83-184)/(34-94) 106/54 mmHg (02/12 0525) SpO2:  [93 %-100 %] 95 % (02/12 0525) Weight:  [77.204 kg (170 lb 3.3 oz)-93.668 kg (206 lb 8 oz)] 93.668 kg (206 lb 8 oz) (02/12 0525) Weight change:  Last BM Date: 05/20/13  Intake/Output from previous day: 02/11 0701 - 02/12 0700 In: 287.5 [I.V.:237.5; IV Piggyback:50] Out: -  Intake/Output this shift:    General appearance: alert Resp: clear to auscultation bilaterally Cardio: irregularly irregular rhythm GI: soft, non-tender; bowel sounds normal; no masses,  no organomegaly Extremities: extremities normal, atraumatic, no cyanosis or edema  Lab Results:  Recent Labs  05/22/13 1401 05/23/13 0533  WBC 7.0 8.7  HGB 9.6* 9.2*  HCT 29.0* 28.0*  PLT 138* 147*   BMET  Recent Labs  05/22/13 1401 05/23/13 0533  NA 138 139  K 4.0 3.9  CL 104 106  CO2 20 20  GLUCOSE 208* 135*  BUN 50* 47*  CREATININE 2.48* 2.26*  CALCIUM 9.1 9.1    Studies/Results: Dg Chest Port 1 View  05/22/2013   CLINICAL DATA:  Emesis.  Shortness of breath.  EXAM: PORTABLE CHEST - 1 VIEW  COMPARISON:  DG CHEST 2 VIEW dated 04/10/2013; DG CHEST 2 VIEW dated 03/14/2013; DG CHEST 2V dated 09/10/2012  FINDINGS: 1323 hr. There is chronic lung disease with a diffuse fibrotic changes and calcified pleural plaque formation bilaterally. Compared with the prior studies, there are lower lung volumes with mildly increased patchy opacity in both lung bases. There is no significant pleural effusion. Cardiomegaly and superior mediastinal widening are stable. The osseous structures appear stable with glenohumeral degenerative changes bilaterally.  IMPRESSION: Increased opacities at both lung bases are associated with lower lung volumes and may  represent atelectasis superimposed on chronic lung disease. No definite consolidation or other significant changes.   Electronically Signed   By: Camie Patience M.D.   On: 05/22/2013 13:48    Medications: I have reviewed the patient's current medications.  Assessment/Plan: Hypotension:  Seen at cardiology- medication  Stopped- off Lasix and norvasc-  IVF- BP better Continue gentle hydration UTI- h/o freq UTI- culture pending- start on Rocephin IV awaits culture PVD-  Angiography per Vascular to be rescheduled  Later A/C renal failure- follow renal function. Afib chonic- no Coumadin; ASA only Anemai ACD- stable PT consult   LOS: 1 day   [name] 05/23/2013, 7:33 AM

## 2013-05-24 ENCOUNTER — Other Ambulatory Visit: Payer: Self-pay

## 2013-05-24 LAB — GLUCOSE, CAPILLARY
GLUCOSE-CAPILLARY: 140 mg/dL — AB (ref 70–99)
Glucose-Capillary: 115 mg/dL — ABNORMAL HIGH (ref 70–99)
Glucose-Capillary: 163 mg/dL — ABNORMAL HIGH (ref 70–99)
Glucose-Capillary: 174 mg/dL — ABNORMAL HIGH (ref 70–99)

## 2013-05-24 LAB — BASIC METABOLIC PANEL
BUN: 40 mg/dL — ABNORMAL HIGH (ref 6–23)
CHLORIDE: 110 meq/L (ref 96–112)
CO2: 21 mEq/L (ref 19–32)
Calcium: 8.7 mg/dL (ref 8.4–10.5)
Creatinine, Ser: 1.95 mg/dL — ABNORMAL HIGH (ref 0.50–1.35)
GFR calc non Af Amer: 28 mL/min — ABNORMAL LOW (ref >90)
GFR, EST AFRICAN AMERICAN: 32 mL/min — AB (ref >90)
Glucose, Bld: 114 mg/dL — ABNORMAL HIGH (ref 70–99)
POTASSIUM: 4.3 meq/L (ref 3.7–5.3)
Sodium: 144 mEq/L (ref 137–147)

## 2013-05-24 MED ORDER — ISOSORBIDE MONONITRATE ER 30 MG PO TB24
30.0000 mg | ORAL_TABLET | Freq: Every day | ORAL | Status: DC
  Administered 2013-05-24 – 2013-05-27 (×4): 30 mg via ORAL
  Filled 2013-05-24 (×4): qty 1

## 2013-05-24 MED ORDER — METOPROLOL TARTRATE 12.5 MG HALF TABLET
12.5000 mg | ORAL_TABLET | Freq: Two times a day (BID) | ORAL | Status: DC
  Administered 2013-05-24 – 2013-05-27 (×7): 12.5 mg via ORAL
  Filled 2013-05-24 (×8): qty 1

## 2013-05-24 NOTE — Care Management Note (Signed)
    Page 1 of 2   05/27/2013     12:06:37 PM   CARE MANAGEMENT NOTE 05/27/2013  Patient:  DEONTAYE, CIVELLO   Account Number:  [id number]  Date Initiated:  05/23/2013  Documentation initiated by:  Scott Fix  Subjective/Objective Assessment:   PT ADM ON 05/22/13 WITH DEHYDRATION, UTI.  PTA, PT RESIDES AT [place].     Action/Plan:   P.T. EVAL COMPLETED; RECOMMENDING HH AT DC.  WILL FOLLOW FOR ORDERS.   Anticipated DC Date:  05/27/2013   Anticipated DC Plan:  [place]  CM consult      [facility] Choice  HOME HEALTH   Choice offered to / List presented to:  C-1 Patient        [place] arranged  HH-1 RN  HH-2 PT      [place] agency  [employer]   Status of service:  Completed, signed off Medicare Important Message given?   (If response is "NO", the following Medicare IM given date fields will be blank) Date Medicare IM given:   Date Additional Medicare IM given:    Discharge Disposition:  [place]  Per UR Regulation:  Reviewed for med. necessity/level of care/duration of stay  If discussed at [place] of Stay Meetings, dates discussed:    Comments:  05/27/13 Geraldine Tesar,RN,BSN [phone] PT [place]; CONFIRMED WITH SON, Timmey Lyness, JR, THAT PT [place], AND WISHES TO RESUME SERVICES.  SON DENIES ANY DME NEEDS.  NOTIFIED [place].  05/24/13 Prashant Glosser,RN,BSN [phone] MD:  Waylan Rocher ORDERS FOR HH FOLLOW UP AT DC; RN CASE MANAGER WILL ARRANGE.  THANKS!

## 2013-05-24 NOTE — Care Management Note (Signed)
Patient is active with [facility] - HHRN/PT. Resumption of care orders requested upon discharge.

## 2013-05-24 NOTE — Progress Notes (Signed)
Subjective: Pt eating Bkfast No c/o BP better  Objective: Vital signs in last 24 hours: Temp:  [98.3 F (36.8 C)-98.6 F (37 C)] 98.5 F (36.9 C) (02/13 0512) Pulse Rate:  [82-98] 94 (02/13 0512) Resp:  [18-19] 18 (02/13 0512) BP: (124-148)/(59-83) 148/83 mmHg (02/13 0512) SpO2:  [96 %-98 %] 97 % (02/13 0512) Weight:  [94.9 kg (209 lb 3.5 oz)] 94.9 kg (209 lb 3.5 oz) (02/13 0512) Weight change: 17.696 kg (39 lb 0.2 oz) Last BM Date: 05/22/13  Intake/Output from previous day: 02/12 0701 - 02/13 0700 In: 960 [P.O.:960] Out: 2075 [Urine:2075] Intake/Output this shift:    General appearance: alert Resp: clear to auscultation bilaterally Cardio: irregularly irregular rhythm GI: soft, non-tender; bowel sounds normal; no masses,  no organomegaly Extremities: extremities normal, atraumatic, no cyanosis or edema  Lab Results:  Recent Labs  05/22/13 1401 05/23/13 0533  WBC 7.0 8.7  HGB 9.6* 9.2*  HCT 29.0* 28.0*  PLT 138* 147*   BMET  Recent Labs  05/23/13 0533 05/24/13 0355  NA 139 144  K 3.9 4.3  CL 106 110  CO2 20 21  GLUCOSE 135* 114*  BUN 47* 40*  CREATININE 2.26* 1.95*  CALCIUM 9.1 8.7    Studies/Results: Dg Chest Port 1 View  05/22/2013   CLINICAL DATA:  Emesis.  Shortness of breath.  EXAM: PORTABLE CHEST - 1 VIEW  COMPARISON:  DG CHEST 2 VIEW dated 04/10/2013; DG CHEST 2 VIEW dated 03/14/2013; DG CHEST 2V dated 09/10/2012  FINDINGS: 1323 hr. There is chronic lung disease with a diffuse fibrotic changes and calcified pleural plaque formation bilaterally. Compared with the prior studies, there are lower lung volumes with mildly increased patchy opacity in both lung bases. There is no significant pleural effusion. Cardiomegaly and superior mediastinal widening are stable. The osseous structures appear stable with glenohumeral degenerative changes bilaterally.  IMPRESSION: Increased opacities at both lung bases are associated with lower lung volumes and may  represent atelectasis superimposed on chronic lung disease. No definite consolidation or other significant changes.   Electronically Signed   By: Camie Patience M.D.   On: 05/22/2013 13:48    Medications: I have reviewed the patient's current medications.  Assessment/Plan: Hypotension:  IVF- BP better - d/c IVF monitor BP UTI- h/o freq UTI- culture show >100 K- ID/ Sensitive pending-continue on Rocephin IV awaits ID to [facility]aylor ABX PVD- per Vascular no need for any procedure now; medical management A/C renal failure- follow renal function.  Afib chonic- no Coumadin; ASA only- restart on Metoprolol  CAD- imdur restated- BP is ok Anemai ACD- stable  PT consult  HHN/PT in 1-2 days   LOS: 2 days   [name] 05/24/2013, 7:44 AM

## 2013-05-24 NOTE — Progress Notes (Signed)
Patient resting quietly in bed. Call bell near. Report given to [facility], RN; care released.Cindee Salt

## 2013-05-24 NOTE — Progress Notes (Signed)
Came to visit patient at bedside to explain and offer [facility] services. However, patient was asleep and noted to have some slight confusion. Called patient's daughter on the phone, Maudie Mercury at [phone]. States it was not a good time to talk currently. States she will be up to the hospital in about 35 minutes or so. Writer asked if she could call back so services could be explained. Made her aware that this writer will not be at [facility] by the time she comes to hospital because will be at [facility]. Left packet at bedside. Will make inpatient RNCM aware.  [name], MSN- [facility] [phone]

## 2013-05-24 NOTE — Progress Notes (Signed)
Patient's daughter [name] called back and [facility] Care Management services were explained. Consents were signed. [name] states that patient lives with her brother [name] [phone]. Daughter states the plan is for patient to go home at discharge with home health services. Explained that [facility] will not interfere or replace home health services. Patient will receive post hospital discharge call and will be evaluated for monthly home visits. Confirmed best contact information. Also spoke with inpatient RNCM about visit with daughter and home health services. Packet left at patient's bedside for brother to pick up. Daughter states she make her brother that [facility] will follow up post hospital discharge. [name], MSN- RN, [place] [phone]

## 2013-05-25 LAB — GLUCOSE, CAPILLARY
Glucose-Capillary: 120 mg/dL — ABNORMAL HIGH (ref 70–99)
Glucose-Capillary: 192 mg/dL — ABNORMAL HIGH (ref 70–99)
Glucose-Capillary: 155 mg/dL — ABNORMAL HIGH (ref 70–99)
Glucose-Capillary: 151 mg/dL — ABNORMAL HIGH (ref 70–99)

## 2013-05-25 LAB — BASIC METABOLIC PANEL
BUN: 38 mg/dL — ABNORMAL HIGH (ref 6–23)
CO2: 21 mEq/L (ref 19–32)
CREATININE: 1.8 mg/dL — AB (ref 0.50–1.35)
Calcium: 8.6 mg/dL (ref 8.4–10.5)
Chloride: 110 mEq/L (ref 96–112)
GFR calc non Af Amer: 31 mL/min — ABNORMAL LOW (ref >90)
GFR, EST AFRICAN AMERICAN: 36 mL/min — AB (ref >90)
Glucose, Bld: 123 mg/dL — ABNORMAL HIGH (ref 70–99)
POTASSIUM: 4.5 meq/L (ref 3.7–5.3)
Sodium: 142 mEq/L (ref 137–147)

## 2013-05-25 NOTE — Progress Notes (Signed)
Assessment/Plan: Principal Problem:   Acute on chronic renal failure - creatinine back to baseline.  Active Problems:   T2DM (type 2 diabetes mellitus)   UTI (lower urinary tract infection) - culture is mult species. However, pH is over 7 and triple phosphate crystals present so this is Klebsiella or Proteus (urea splitting organisms). Continue ceftriaxone today. Probably change to cipro in AM    Aortic root dilatation   Dehydration - resolved   Anemia of chronic disease   Diabetes - CBGs are good.    Mild malnutrition - weight was 97 kg in 9/14, now 91 kg (6% in five months). Albumin is 2.7. Some evidence of mild malnutrition. Encourage adequate po intake as he improves.    Subjective: Feels better. Denies pain, shortness of breath.   Objective:  Vital Signs: Filed Vitals:   05/24/13 1023 05/24/13 1950 05/24/13 2120 05/25/13 0541  BP: 122/59 130/71 126/68 141/57  Pulse: 102 83 96 64  Temp:  98.8 F (37.1 C) 98.3 F (36.8 C) 98.6 F (37 C)  TempSrc:  Oral Oral Oral  Resp:  20 18 20   Height:      Weight:    91.3 kg (201 lb 4.5 oz)  SpO2:  98% 96% 96%     EXAM: Awake, alert, oriented to place   Intake/Output Summary (Last 24 hours) at 05/25/13 0955 Last data filed at 05/25/13 0500  Gross per 24 hour  Intake    360 ml  Output   1600 ml  Net  -1240 ml    Lab Results:  Recent Labs  05/24/13 0355 05/25/13 0510  NA 144 142  K 4.3 4.5  CL 110 110  CO2 21 21  GLUCOSE 114* 123*  BUN 40* 38*  CREATININE 1.95* 1.80*  CALCIUM 8.7 8.6    Recent Labs  05/22/13 1401  AST 18  ALT 12  ALKPHOS 110  BILITOT 0.3  PROT 7.3  ALBUMIN 2.7*    Recent Labs  05/22/13 1401  LIPASE 32    Recent Labs  05/22/13 1401 05/23/13 0533  WBC 7.0 8.7  NEUTROABS 5.6  --   HGB 9.6* 9.2*  HCT 29.0* 28.0*  MCV 100.0 99.3  PLT 138* 147*    Recent Labs  05/22/13 1401  TROPONINI <0.30   BNP    Component Value Date/Time   PROBNP 2683.0* 12/21/2012 1626   No results  found for this basename: DDIMER,  in the last 72 hours  Recent Labs  05/22/13 2005  HGBA1C 6.9*   No results found for this basename: CHOL, HDL, LDLCALC, TRIG, CHOLHDL, LDLDIRECT,  in the last 72 hours No results found for this basename: TSH, T4TOTAL, FREET3, T3FREE, THYROIDAB,  in the last 72 hours No results found for this basename: VITAMINB12, FOLATE, FERRITIN, TIBC, IRON, RETICCTPCT,  in the last 72 hours  Studies/Results: No results found. Medications: Medications administered in the last 24 hours reviewed.  Current Medication List reviewed.    LOS: 3 days   [facility] Internal Medicine @ [facility] [phone]) 05/25/2013, 9:55 AM

## 2013-05-25 NOTE — Progress Notes (Signed)
Received call from centralized monitor tech that pt has been reported to be in A fib however when looking back at record of telemetry throughout day he was seen to be in and out of Afib vs. SR with AV block. 12 lead performed and vitals obtained at this time and pt was found to have stable vitals as well as be asymptomatic. MD on call made aware of situation and the concern that pt was possibly demonstrating a complete HB on telemetry however 12 lead clarified current rhythm to be SR with 1 deg HB. The monitor tech's saved strips do show SR with frequent PACs/PVCs and can be seen in EPIC. 12 lead has been added to shadow chart. No new orders received at this time and will continue to monitor pt. Vitals can be seen below  Filed Vitals:   05/24/13 0512 05/24/13 1023 05/24/13 1950 05/24/13 2120  BP: 148/83 122/59 130/71 126/68  Pulse: 94 102 83 96  Temp: 98.5 F (36.9 C)  98.8 F (37.1 C) 98.3 F (36.8 C)  TempSrc: Oral  Oral Oral  Resp: 18  20 18   Height:      Weight: 94.9 kg (209 lb 3.5 oz)     SpO2: 97%  98% 96%

## 2013-05-26 DIAGNOSIS — E441 Mild protein-calorie malnutrition: Secondary | ICD-10-CM | POA: Diagnosis present

## 2013-05-26 LAB — GLUCOSE, CAPILLARY
GLUCOSE-CAPILLARY: 188 mg/dL — AB (ref 70–99)
GLUCOSE-CAPILLARY: 122 mg/dL — AB (ref 70–99)
Glucose-Capillary: 118 mg/dL — ABNORMAL HIGH (ref 70–99)
Glucose-Capillary: 127 mg/dL — ABNORMAL HIGH (ref 70–99)

## 2013-05-26 MED ORDER — CIPROFLOXACIN HCL 500 MG PO TABS
500.0000 mg | ORAL_TABLET | Freq: Two times a day (BID) | ORAL | Status: DC
  Filled 2013-05-26 (×3): qty 1

## 2013-05-26 MED ORDER — CIPROFLOXACIN HCL 500 MG PO TABS
500.0000 mg | ORAL_TABLET | Freq: Every day | ORAL | Status: DC
  Administered 2013-05-26 – 2013-05-27 (×2): 500 mg via ORAL
  Filled 2013-05-26 (×4): qty 1

## 2013-05-26 NOTE — Progress Notes (Signed)
Assessment/Plan: Principal Problem:   Acute on chronic renal failure - creatinine at baseline Active Problems:   T2DM (type 2 diabetes mellitus)   Type II or unspecified type diabetes mellitus with renal manifestations, not stated as uncontrolled   UTI (lower urinary tract infection) - will change to Cipro. Organisms not identified but likely Proteus or Klebsiella based on urine pH of 8.5 and triple phosphate crystals.    Aortic root dilatation   Dehydration   Anemia of chronic disease   Mild malnutrition - encouraged po intake.    Subjective: Feels okay. No new complaints.   Objective:  Vital Signs: Filed Vitals:   05/25/13 0541 05/25/13 1442 05/25/13 2028 05/26/13 0428  BP: 141/57 134/53 148/54 168/82  Pulse: 64 76 39 91  Temp: 98.6 F (37 C) 97.4 F (36.3 C) 98 F (36.7 C) 97.7 F (36.5 C)  TempSrc: Oral Oral Oral Oral  Resp: 20 18 20 20   Height:      Weight: 91.3 kg (201 lb 4.5 oz)   92.2 kg (203 lb 4.2 oz)  SpO2: 96% 98% 96% 98%     EXAM: Abd: soft, nontender.  Alert, oriented to place   Intake/Output Summary (Last 24 hours) at 05/26/13 0806 Last data filed at 05/26/13 0433  Gross per 24 hour  Intake    720 ml  Output   1200 ml  Net   -480 ml    Lab Results:  Recent Labs  05/24/13 0355 05/25/13 0510  NA 144 142  K 4.3 4.5  CL 110 110  CO2 21 21  GLUCOSE 114* 123*  BUN 40* 38*  CREATININE 1.95* 1.80*  CALCIUM 8.7 8.6   No results found for this basename: AST, ALT, ALKPHOS, BILITOT, PROT, ALBUMIN,  in the last 72 hours No results found for this basename: LIPASE, AMYLASE,  in the last 72 hours No results found for this basename: WBC, NEUTROABS, HGB, HCT, MCV, PLT,  in the last 72 hours No results found for this basename: CKTOTAL, CKMB, CKMBINDEX, TROPONINI,  in the last 72 hours BNP    Component Value Date/Time   PROBNP 2683.0* 12/21/2012 1626   No results found for this basename: DDIMER,  in the last 72 hours No results found for this  basename: HGBA1C,  in the last 72 hours No results found for this basename: CHOL, HDL, LDLCALC, TRIG, CHOLHDL, LDLDIRECT,  in the last 72 hours No results found for this basename: TSH, T4TOTAL, FREET3, T3FREE, THYROIDAB,  in the last 72 hours No results found for this basename: VITAMINB12, FOLATE, FERRITIN, TIBC, IRON, RETICCTPCT,  in the last 72 hours  Studies/Results: No results found. Medications: Medications administered in the last 24 hours reviewed.  Current Medication List reviewed.    LOS: 4 days   [facility] Internal Medicine @ [facility] [phone]) 05/26/2013, 8:06 AM

## 2013-05-27 LAB — GLUCOSE, CAPILLARY
Glucose-Capillary: 125 mg/dL — ABNORMAL HIGH (ref 70–99)
Glucose-Capillary: 163 mg/dL — ABNORMAL HIGH (ref 70–99)

## 2013-05-27 MED ORDER — CIPROFLOXACIN HCL 500 MG PO TABS: 500.0000 mg | ORAL_TABLET | Freq: Every day | ORAL | Status: DC

## 2013-05-27 NOTE — Progress Notes (Signed)
Assessment unchanged. Discussed D/C instructions with pt and family including new medications and f/u appointments. Verbalized understanding. IV and tele removed. Pt left via personal wheelchair accompanied by family with belongings.

## 2013-05-27 NOTE — Discharge Summary (Signed)
Physician Discharge Summary  Patient ID: [name] MRN: [id number] DOB/AGE: 05-12-1917 80 y.o.  Admit date: 05/22/2013 Discharge date: 05/27/2013  Admission Diagnoses: Acute on chronic renal failure Orthostatic hypotension Type 2 diabetes mellitus Bacteruria and  aortic root dilation Atrial Fibrillation    anemia of chronic disease Mild malnutrition    Discharge Diagnoses:  Principal Problem:   Acute on chronic renal failure Active Problems:   T2DM (type 2 diabetes mellitus)   Type II or unspecified type diabetes mellitus with renal manifestations, not stated as uncontrolled   UTI (lower urinary tract infection)   Aortic root dilatation   Anemia of chronic disease   Mild malnutrition   Discharged Condition: good  Hospital Course: The patient was admitted on February 11 after being seen by cardiology for heart failure followup and had a hypotensive with worsening kidney function at home the patient became pale and experienced episodes of vomiting almost passed out and was brought to the ER with hypotension responded to IV fluid bolus. The patient's lisinopril and Lasix were stopped by cardiology. The patient was gently hydrated during hospitalization and was found to have with antibiotics, culture showed no predominant organism. The patient's acute on chronic kidney failure improved and creatinine back to 1.8 at baseline at discharge. The patient's vital signs were stable at discharge without any signs of volume overload. His atrial fibrillation was controlled off of any rate controlling medication. He is not considered a good candidate for anticoagulation.  Consults: None  Significant Diagnostic Studies: labs: Sodium 142, potassium 4.5, BUN 38, creatinine 1.80 at discharge, microbiology: urine culture: No predominant organism and radiology: CXR: atelectasis bilaterally  Treatments: IV hydration and antibiotics: Cipro  Discharge Exam: Blood pressure 143/82, pulse 94,  temperature 98.1 F (36.7 C), temperature source Oral, resp. rate 20, height 5\' 2"  (1.575 m), weight 90.6 kg (199 lb 11.8 oz), SpO2 97.00%. General appearance: alert and cooperative Resp: clear to auscultation bilaterally Cardio: irregularly irregular rhythm Extremities: extremities normal, atraumatic, no cyanosis or edema  Disposition: 01-home   Future Appointments Provider Department Dept Phone   05/29/2013 11:10 AM Liliane Shi, PA-C [place] [phone]   08/26/2013 10:45 AM Sueanne Margarita, MD [place] [phone]       Medication List    STOP taking these medications       doxycycline 100 MG EC tablet  Commonly known as:  DORYX     nitroGLYCERIN 0.4 MG SL tablet  Commonly known as:  NITROSTAT      TAKE these medications       acetaminophen 325 MG tablet  Commonly known as:  TYLENOL  Take 650 mg by mouth every 4 (four) hours as needed for mild pain.     albuterol 108 (90 BASE) MCG/ACT inhaler  Commonly known as:  PROVENTIL HFA;VENTOLIN HFA  Inhale 2 puffs into the lungs daily as needed for wheezing or shortness of breath.     aspirin EC 81 MG tablet  Take 81 mg by mouth daily.     beta carotene w/minerals tablet  Take 1 tablet by mouth daily.     cilostazol 100 MG tablet  Commonly known as:  PLETAL  Take 100 mg by mouth 2 (two) times daily.     ciprofloxacin 500 MG tablet  Commonly known as:  CIPRO  Take 1 tablet (500 mg total) by mouth daily with breakfast.     cyanocobalamin 1000 MCG tablet  Take 1,000 mcg by mouth  3 (three) times a week. Take 1000 mcg on Monday, Wednesday, and Friday     ferrous sulfate 325 (65 FE) MG tablet  Take 325 mg by mouth daily with breakfast.     glipiZIDE 2.5 MG 24 hr tablet  Commonly known as:  GLUCOTROL XL  Take 2.5 mg by mouth daily with breakfast.     isosorbide mononitrate 30 MG 24 hr tablet  Commonly known as:  IMDUR  Take 30 mg by mouth daily.     multivitamin with  minerals Tabs tablet  Take 1 tablet by mouth daily.     tamsulosin 0.4 MG Caps capsule  Commonly known as:  FLOMAX  Take 0.4 mg by mouth daily after breakfast.           Follow-up Information   Follow up with Irven Shelling, MD In 2 weeks.   Specialty:  Internal Medicine   Contact information:   [address], [address] [place] [address] [phone]       Signed: Irven Shelling 05/27/2013, 7:46 AM

## 2013-05-29 ENCOUNTER — Encounter: Payer: Medicare HMO | Admitting: Physician Assistant

## 2013-05-29 NOTE — Progress Notes (Deleted)
[address] [place], [place]  [address] Phone: [phone] Fax:  [phone]  Date:  05/29/2013   ID:  [name], DOB March 17, 1918, MRN [id number]  PCP:  Brent Shelling, MD  Cardiologist:  Brent Him, MD     History of Present Illness: [name] is a 80 y.o. male with a history of CAD (s/p DES to the LAD and RCA in 2006), chronic combined systolic/diastolic CHF, mild DCM with EF 45-50%, afib, chronic kidney disease, HTN, dyslipidemia, mild AS, carotid stenosis s/p R CEA in 01/2011 and chronic LE edema.   Carotid US (10/doesn't 12): Right CEA patent, LICA <29%. Echocardiogram (12/01/12): EF 45-50%, mild aortic stenosis (no gradient measurement available), MAC, mild LAE.  He saw Dr. Fransico Luna 2/11 after a recent admission for UTI.  His BP was low and his ACEI and Lasix were held with plans for f/u today.  However, he was admitted 2/11-2/16 after presenting with n/v and hypotension c/b AKI.  He was hydrated with IVFs and creatinine improved.  ***   Recent Labs: 09/17/2012: TSH 3.502  05/22/2013: ALT 12; AST 18  05/23/2013: Hemoglobin 9.2*  05/25/2013: BUN 38*; Creatinine 1.80*; Potassium 4.5; Sodium 142 09/17/2012: Magnesium 2.3   Wt Readings from Last 3 Encounters:  05/27/13 199 lb 11.8 oz (90.6 kg)  05/27/13 199 lb 11.8 oz (90.6 kg)  05/10/13 168 lb (76.204 kg)     Past Medical History  Diagnosis Date  . Macular degeneration   . Peripheral vascular disease   . TIA (transient ischemic attack)   . Low back pain   . UTI (lower urinary tract infection)   . Coronary artery disease 02/2005    PCI with DES of RCA and LAD with normal LVF  . Hypertension   . Chronic atrial fibrillation   . Carotid artery occlusion     s/p right CEA  . Hyperlipidemia   . Edema extremities   . CHF (congestive heart failure) Sept. 2014    EF 45-50%  . Bladder cancer 2001  . Altered mental status     admission 04/10/2013  . Atrial fibrillation     not a  coumadin candidate  . Pneumonia     "several bouts this last year and 1/2" (05/22/2013)  . Type II diabetes mellitus   . Anemia   . Seizures     "he's had some; before they cleaned out carotid artery" (05/22/2013)  . Arthritis     "joints" (05/22/2013)  . Dementia   . PAD (peripheral artery disease)     Archie Endo 05/22/2013  . Chronic kidney disease (CKD), stage III (moderate)     Current Outpatient Prescriptions  Medication Sig Dispense Refill  . acetaminophen (TYLENOL) 325 MG tablet Take 650 mg by mouth every 4 (four) hours as needed for mild pain.       Marland Kitchen albuterol (PROVENTIL HFA;VENTOLIN HFA) 108 (90 BASE) MCG/ACT inhaler Inhale 2 puffs into the lungs daily as needed for wheezing or shortness of breath.      Marland Kitchen aspirin EC 81 MG tablet Take 81 mg by mouth daily.       . beta carotene w/minerals (OCUVITE) tablet Take 1 tablet by mouth daily.       . cilostazol (PLETAL) 100 MG tablet Take 100 mg by mouth 2 (two) times daily.      . ciprofloxacin (CIPRO) 500 MG tablet Take 1 tablet (500 mg total) by mouth daily with breakfast.  3 tablet  0  .  cyanocobalamin 1000 MCG tablet Take 1,000 mcg by mouth 3 (three) times a week. Take 1000 mcg on Monday, Wednesday, and Friday      . ferrous sulfate 325 (65 FE) MG tablet Take 325 mg by mouth daily with breakfast.      . glipiZIDE (GLUCOTROL XL) 2.5 MG 24 hr tablet Take 2.5 mg by mouth daily with breakfast.      . isosorbide mononitrate (IMDUR) 30 MG 24 hr tablet Take 30 mg by mouth daily.      . Multiple Vitamin (MULTIVITAMIN WITH MINERALS) TABS tablet Take 1 tablet by mouth daily.      . tamsulosin (FLOMAX) 0.4 MG CAPS Take 0.4 mg by mouth daily after breakfast.       No current facility-administered medications for this visit.    Allergies:    Allergies  Allergen Reactions  . Penicillins Swelling  . Sulfa Antibiotics Other (See Comments)    unknown    Social History:  The patient  reports that he quit smoking about 74 years ago. His smoking  use included Cigarettes. He smoked 0.00 packs per day. He has never used smokeless tobacco. He reports that he does not drink alcohol or use illicit drugs.   Family History:  The patient's family history includes Heart disease in his brother; Stroke in his brother.   ROS:  Please see the history of present illness.  ***  All other systems reviewed and negative.   PHYSICAL EXAM: VS:  There were no vitals taken for this visit. Well nourished, well developed, in no acute distress HEENT: normal Neck: no JVD Cardiac:  normal S1, S2; RRR; no murmur Lungs:  clear to auscultation bilaterally, no wheezing, rhonchi or rales Abd: soft, nontender, no hepatomegaly Ext: no edema Skin: warm and dry Neuro:  CNs 2-12 intact, no focal abnormalities noted  ECG:  ***      ASSESSMENT AND PLAN:   1. Chronic Combined Systolic and Diastolic CHF:  Last echo 08/4648 EF 45-50%.  ACEI and Lasix recently stopped due to low BP.  *** 2. Mild AS:  Asymptomatic.  Follow clinically.  *** 3. HTN:  Medications recently adjusted due to hypotension.  *** 4. Chronic Atrial Fibrillation:  Metoprolol recently stopped in the hospital due to bradycardia.  Continue ASA.  He is not felt to be a coumadin candidate due to advanced age and debilitated state.  ***  5. CAD:   No*** angina.  Continue ASA/Imdur. 6. Dyslipidemia:  ***Continue Niacin  7. Chronic LE edema:  Now off Lasix due to hypotension.  *** 8. Disposition:  ***   Signed, Richardson Dopp, PA-C   05/29/2013 10:19 AM

## 2013-05-29 NOTE — Progress Notes (Signed)
This encounter was created in error - please disregard.

## 2013-05-31 ENCOUNTER — Encounter: Payer: Self-pay | Admitting: Physician Assistant

## 2013-06-04 ENCOUNTER — Encounter: Payer: Self-pay | Admitting: Adult Health

## 2013-06-04 NOTE — Progress Notes (Signed)
Patient ID: [name], male   DOB: 1917-11-30, 80 y.o.   MRN: [id number]              PROGRESS NOTE  DATE: 05/14/2013   FACILITY: [facility]  LEVEL OF CARE: SNF (31)  Acute Visit  CHIEF COMPLAINT:  Discharge Notes  HISTORY OF PRESENT ILLNESS:This is a 80 year old male who is for discharge home with Home health PT, OT and Nursing. He has been admitted to [facility] on 04/17/13 from [facility] with principal discharge diagnosis of UTI - resolved. Patient was admitted to this facility for short-term rehabilitation after the patient's recent hospitalization.  Patient has completed SNF rehabilitation and therapy has cleared the patient for discharge.  Reassessment of ongoing problem(s):  ATRIAL FIBRILLATION: the patients atrial fibrillation remains stable.  The patient denies DOE, tachycardia, orthopnea, transient neurological sx, pedal edema, palpitations, & PNDs.  No complications noted from the medications currently being used.  HTN: Pt 's HTN remains stable.  Denies CP, sob, DOE, pedal edema, headaches, dizziness or visual disturbances.  No complications from the medications currently being used.  Last BP : 118/62  CHF:The patient does not relate significant weight changes, denies sob, DOE, orthopnea, PNDs, pedal edema, palpitations or chest pain.  CHF remains stable.  No complications form the medications being used.  PAST MEDICAL HISTORY : Reviewed.  No changes.  CURRENT MEDICATIONS: Reviewed per [facility]  REVIEW OF SYSTEMS:  GENERAL: no change in appetite, no fatigue, no weight changes, no fever, chills or weakness RESPIRATORY: no cough, SOB, DOE, wheezing, hemoptysis CARDIAC: no chest pain, edema or palpitations GI: no abdominal pain, diarrhea, constipation, heart burn, nausea or vomiting  PHYSICAL EXAMINATION  GENERAL: no acute distress, normal body habitus EYES: conjunctivae normal, sclerae normal, normal eye lids NECK: supple, trachea midline,  no neck masses, no thyroid tenderness, no thyromegaly RESPIRATORY: breathing is even & unlabored, BS CTAB CARDIAC: RRR, no murmur,no extra heart sounds, no edema GI: abdomen soft, normal BS, no masses, no tenderness, no hepatomegaly, no splenomegaly PSYCHIATRIC: the patient is alert & oriented to person, affect & behavior appropriate  LABS/RADIOLOGY:  Labs reviewed: Basic Metabolic Panel:  Recent Labs  09/17/12 0252 09/17/12 0818  09/22/12 0525 09/23/12 0345 09/24/12 0530  04/15/13 0655 04/16/13 0324 04/17/13 0400  NA 140 142  < > 132* 131* 135  < > 138 138 139  K 4.1 5.0  < > 4.3 4.6 4.5  < > 4.8 4.2 4.4  CL 110 108  < > 96 97 102  < > 101 101 106  CO2 22 22  < > 23 24 25   < > 24 24 23   GLUCOSE 106* 107*  < > 123* 144* 129*  < > 135* 153* 121*  BUN 38* 38*  < > 97* 103* 89*  < > 54* 50* 41*  CREATININE 1.91* 1.97*  < > 3.82* 3.37* 2.73*  < > 2.36* 2.15* 1.81*  CALCIUM 9.1 9.1  < > 8.4 8.8 9.0  < > 8.7 8.2* 8.7  MG  --  2.3  --   --   --   --   --   --   --   --   PHOS  --   --   --   --  3.8 3.1  --   --   --   --   < > = values in this interval not displayed. Liver Function Tests:  Recent Labs  09/17/12  0818  09/24/12 0530 10/24/12 1219 12/01/12 0500  AST 25  --   --  21 17  ALT 11  --   --  13 10  ALKPHOS 137*  --   --  120* 120*  BILITOT 0.4  --   --  0.5 0.3  PROT 8.1  --   --  7.3 6.7  ALBUMIN 3.2*  < > 2.4* 2.8* 2.2*  < > = values in this interval not displayed.  CBC:  Recent Labs  12/21/12 1626  04/10/13 1220  04/11/13 0553 04/12/13 0550 04/16/13 0324  WBC 5.8  < > 8.0  --  5.2 4.5 6.4  NEUTROABS 4.2  --  6.1  --   --  2.4  --   HGB 9.8*  < > 11.3*  < > 9.1* 10.1* 8.6*  HCT 29.4*  < > 33.2*  < > 27.1* 30.6* 25.9*  MCV 96.7  < > 99.7  --  99.3 100.0 100.8*  PLT 307  < > PLATELET CLUMPS NOTED ON SMEAR, COUNT APPEARS ADEQUATE  --  89* 101* 118*  < > = values in this interval not displayed.  Cardiac Enzymes:  Recent Labs  11/30/12 1452  11/30/12 2043 12/01/12 0500  TROPONINI <0.30 <0.30 <0.30   CBG:  Recent Labs  04/16/13 2050 04/17/13 0657 04/17/13 1127  GLUCAP 150* 129* 151*    ASSESSMENT/PLAN:  Atrial Fibrillation - rate-controlled Hypertension - well-controlled PVD - stable Diabetes Mellitus, type 2 - well-controlled Chronic Diastolic heart failure - stable  I have filled out patient's discharge paperwork and written prescriptions.  Patient will receive home health PT, OT and Nursing.   Total discharge time: Less than 30 minutes Discharge time involved coordination of the discharge process with Education officer, museum, nursing staff and therapy department. Medical justification for home health services verified.  CPT CODE: 26203  Seth Bake - NP [facility] [phone]

## 2013-06-07 ENCOUNTER — Ambulatory Visit: Payer: Medicare HMO | Admitting: Family

## 2013-06-07 ENCOUNTER — Other Ambulatory Visit (HOSPITAL_COMMUNITY): Payer: Medicare HMO

## 2013-06-07 ENCOUNTER — Other Ambulatory Visit: Payer: Self-pay

## 2013-06-09 ENCOUNTER — Other Ambulatory Visit: Payer: Self-pay

## 2013-06-09 ENCOUNTER — Encounter (HOSPITAL_COMMUNITY): Payer: Self-pay | Admitting: *Deleted

## 2013-06-09 ENCOUNTER — Emergency Department (HOSPITAL_COMMUNITY): Payer: Medicare HMO

## 2013-06-09 ENCOUNTER — Inpatient Hospital Stay (HOSPITAL_COMMUNITY)
Admission: EM | Admit: 2013-06-09 | Discharge: 2013-06-19 | DRG: 682 | Disposition: A | Discharge status: Hospice | Payer: Medicare HMO | Attending: Internal Medicine | Admitting: Internal Medicine

## 2013-06-09 ENCOUNTER — Other Ambulatory Visit: Payer: Self-pay | Admitting: Adult Health

## 2013-06-09 DIAGNOSIS — R600 Localized edema: Secondary | ICD-10-CM

## 2013-06-09 DIAGNOSIS — D631 Anemia in chronic kidney disease: Secondary | ICD-10-CM | POA: Diagnosis present

## 2013-06-09 DIAGNOSIS — Z9861 Coronary angioplasty status: Secondary | ICD-10-CM

## 2013-06-09 DIAGNOSIS — N138 Other obstructive and reflux uropathy: Secondary | ICD-10-CM | POA: Diagnosis present

## 2013-06-09 DIAGNOSIS — R339 Retention of urine, unspecified: Secondary | ICD-10-CM | POA: Diagnosis present

## 2013-06-09 DIAGNOSIS — E440 Moderate protein-calorie malnutrition: Secondary | ICD-10-CM | POA: Diagnosis present

## 2013-06-09 DIAGNOSIS — Z87891 Personal history of nicotine dependence: Secondary | ICD-10-CM

## 2013-06-09 DIAGNOSIS — I129 Hypertensive chronic kidney disease with stage 1 through stage 4 chronic kidney disease, or unspecified chronic kidney disease: Secondary | ICD-10-CM | POA: Diagnosis present

## 2013-06-09 DIAGNOSIS — I4892 Unspecified atrial flutter: Secondary | ICD-10-CM | POA: Diagnosis not present

## 2013-06-09 DIAGNOSIS — N184 Chronic kidney disease, stage 4 (severe): Secondary | ICD-10-CM | POA: Diagnosis present

## 2013-06-09 DIAGNOSIS — Z8673 Personal history of transient ischemic attack (TIA), and cerebral infarction without residual deficits: Secondary | ICD-10-CM

## 2013-06-09 DIAGNOSIS — N401 Enlarged prostate with lower urinary tract symptoms: Secondary | ICD-10-CM | POA: Diagnosis present

## 2013-06-09 DIAGNOSIS — E872 Acidosis, unspecified: Secondary | ICD-10-CM | POA: Diagnosis present

## 2013-06-09 DIAGNOSIS — N170 Acute kidney failure with tubular necrosis: Principal | ICD-10-CM | POA: Diagnosis present

## 2013-06-09 DIAGNOSIS — I509 Heart failure, unspecified: Secondary | ICD-10-CM | POA: Diagnosis present

## 2013-06-09 DIAGNOSIS — E785 Hyperlipidemia, unspecified: Secondary | ICD-10-CM | POA: Diagnosis present

## 2013-06-09 DIAGNOSIS — Z823 Family history of stroke: Secondary | ICD-10-CM

## 2013-06-09 DIAGNOSIS — I4891 Unspecified atrial fibrillation: Secondary | ICD-10-CM | POA: Diagnosis present

## 2013-06-09 DIAGNOSIS — N179 Acute kidney failure, unspecified: Secondary | ICD-10-CM

## 2013-06-09 DIAGNOSIS — F0390 Unspecified dementia without behavioral disturbance: Secondary | ICD-10-CM | POA: Diagnosis present

## 2013-06-09 DIAGNOSIS — D638 Anemia in other chronic diseases classified elsewhere: Secondary | ICD-10-CM

## 2013-06-09 DIAGNOSIS — J690 Pneumonitis due to inhalation of food and vomit: Secondary | ICD-10-CM | POA: Diagnosis present

## 2013-06-09 DIAGNOSIS — Z8249 Family history of ischemic heart disease and other diseases of the circulatory system: Secondary | ICD-10-CM

## 2013-06-09 DIAGNOSIS — Z66 Do not resuscitate: Secondary | ICD-10-CM | POA: Diagnosis present

## 2013-06-09 DIAGNOSIS — R0902 Hypoxemia: Secondary | ICD-10-CM | POA: Diagnosis present

## 2013-06-09 DIAGNOSIS — I739 Peripheral vascular disease, unspecified: Secondary | ICD-10-CM | POA: Diagnosis present

## 2013-06-09 DIAGNOSIS — Z880 Allergy status to penicillin: Secondary | ICD-10-CM

## 2013-06-09 DIAGNOSIS — Z79899 Other long term (current) drug therapy: Secondary | ICD-10-CM

## 2013-06-09 DIAGNOSIS — I10 Essential (primary) hypertension: Secondary | ICD-10-CM | POA: Diagnosis present

## 2013-06-09 DIAGNOSIS — R4181 Age-related cognitive decline: Secondary | ICD-10-CM | POA: Diagnosis present

## 2013-06-09 DIAGNOSIS — R1312 Dysphagia, oropharyngeal phase: Secondary | ICD-10-CM | POA: Diagnosis present

## 2013-06-09 DIAGNOSIS — J9620 Acute and chronic respiratory failure, unspecified whether with hypoxia or hypercapnia: Secondary | ICD-10-CM | POA: Diagnosis not present

## 2013-06-09 DIAGNOSIS — E119 Type 2 diabetes mellitus without complications: Secondary | ICD-10-CM | POA: Diagnosis present

## 2013-06-09 DIAGNOSIS — I5032 Chronic diastolic (congestive) heart failure: Secondary | ICD-10-CM

## 2013-06-09 DIAGNOSIS — I959 Hypotension, unspecified: Secondary | ICD-10-CM | POA: Diagnosis present

## 2013-06-09 DIAGNOSIS — I498 Other specified cardiac arrhythmias: Secondary | ICD-10-CM | POA: Diagnosis present

## 2013-06-09 DIAGNOSIS — I2510 Atherosclerotic heart disease of native coronary artery without angina pectoris: Secondary | ICD-10-CM | POA: Diagnosis present

## 2013-06-09 DIAGNOSIS — J189 Pneumonia, unspecified organism: Secondary | ICD-10-CM

## 2013-06-09 DIAGNOSIS — R34 Anuria and oliguria: Secondary | ICD-10-CM | POA: Diagnosis not present

## 2013-06-09 DIAGNOSIS — Z7982 Long term (current) use of aspirin: Secondary | ICD-10-CM

## 2013-06-09 DIAGNOSIS — Z515 Encounter for palliative care: Secondary | ICD-10-CM

## 2013-06-09 DIAGNOSIS — N039 Chronic nephritic syndrome with unspecified morphologic changes: Secondary | ICD-10-CM

## 2013-06-09 DIAGNOSIS — H3530 Unspecified macular degeneration: Secondary | ICD-10-CM | POA: Diagnosis present

## 2013-06-09 DIAGNOSIS — Z8551 Personal history of malignant neoplasm of bladder: Secondary | ICD-10-CM

## 2013-06-09 DIAGNOSIS — Z882 Allergy status to sulfonamides status: Secondary | ICD-10-CM

## 2013-06-09 DIAGNOSIS — I5043 Acute on chronic combined systolic (congestive) and diastolic (congestive) heart failure: Secondary | ICD-10-CM | POA: Diagnosis present

## 2013-06-09 DIAGNOSIS — N189 Chronic kidney disease, unspecified: Secondary | ICD-10-CM

## 2013-06-09 LAB — CBC WITH DIFFERENTIAL/PLATELET
Basophils Absolute: 0 10*3/uL (ref 0.0–0.1)
Basophils Relative: 0 % (ref 0–1)
EOS ABS: 0.1 10*3/uL (ref 0.0–0.7)
Eosinophils Relative: 1 % (ref 0–5)
HCT: 24.9 % — ABNORMAL LOW (ref 39.0–52.0)
HEMOGLOBIN: 8.5 g/dL — AB (ref 13.0–17.0)
LYMPHS ABS: 1 10*3/uL (ref 0.7–4.0)
Lymphocytes Relative: 14 % (ref 12–46)
MCH: 34.1 pg — AB (ref 26.0–34.0)
MCHC: 34.1 g/dL (ref 30.0–36.0)
MCV: 100 fL (ref 78.0–100.0)
MONOS PCT: 8 % (ref 3–12)
Monocytes Absolute: 0.6 10*3/uL (ref 0.1–1.0)
Neutro Abs: 5.4 10*3/uL (ref 1.7–7.7)
Neutrophils Relative %: 76 % (ref 43–77)
Platelets: 126 10*3/uL — ABNORMAL LOW (ref 150–400)
RBC: 2.49 MIL/uL — ABNORMAL LOW (ref 4.22–5.81)
RDW: 17.1 % — ABNORMAL HIGH (ref 11.5–15.5)
WBC: 7.1 10*3/uL (ref 4.0–10.5)

## 2013-06-09 LAB — GLUCOSE, CAPILLARY
Glucose-Capillary: 128 mg/dL — ABNORMAL HIGH (ref 70–99)
Glucose-Capillary: 184 mg/dL — ABNORMAL HIGH (ref 70–99)

## 2013-06-09 LAB — COMPREHENSIVE METABOLIC PANEL
ALK PHOS: 142 U/L — AB (ref 39–117)
ALT: 17 U/L (ref 0–53)
AST: 47 U/L — ABNORMAL HIGH (ref 0–37)
Albumin: 2.4 g/dL — ABNORMAL LOW (ref 3.5–5.2)
BILIRUBIN TOTAL: 0.3 mg/dL (ref 0.3–1.2)
BUN: 48 mg/dL — AB (ref 6–23)
CO2: 18 mEq/L — ABNORMAL LOW (ref 19–32)
Calcium: 8.5 mg/dL (ref 8.4–10.5)
Chloride: 103 mEq/L (ref 96–112)
Creatinine, Ser: 2.34 mg/dL — ABNORMAL HIGH (ref 0.50–1.35)
GFR, EST AFRICAN AMERICAN: 26 mL/min — AB (ref >90)
GFR, EST NON AFRICAN AMERICAN: 22 mL/min — AB (ref >90)
GLUCOSE: 137 mg/dL — AB (ref 70–99)
POTASSIUM: 3.7 meq/L (ref 3.7–5.3)
Sodium: 136 mEq/L — ABNORMAL LOW (ref 137–147)
Total Protein: 6.9 g/dL (ref 6.0–8.3)

## 2013-06-09 LAB — I-STAT CG4 LACTIC ACID, ED: LACTIC ACID, VENOUS: 1.64 mmol/L (ref 0.5–2.2)

## 2013-06-09 LAB — PRO B NATRIURETIC PEPTIDE: PRO B NATRI PEPTIDE: 24037 pg/mL — AB (ref 0–450)

## 2013-06-09 MED ORDER — SODIUM CHLORIDE 0.9 % IV BOLUS (SEPSIS)
1000.0000 mL | Freq: Once | INTRAVENOUS | Status: DC
  Administered 2013-06-09: 1000 mL via INTRAVENOUS

## 2013-06-09 MED ORDER — ONDANSETRON HCL 4 MG/2ML IJ SOLN: 4.0000 mg | Freq: Four times a day (QID) | INTRAMUSCULAR | Status: DC | PRN

## 2013-06-09 MED ORDER — ALBUTEROL SULFATE HFA 108 (90 BASE) MCG/ACT IN AERS: 2.0000 | INHALATION_SPRAY | Freq: Every day | RESPIRATORY_TRACT | Status: DC | PRN

## 2013-06-09 MED ORDER — ENOXAPARIN SODIUM 30 MG/0.3ML ~~LOC~~ SOLN
30.0000 mg | SUBCUTANEOUS | Status: DC
  Administered 2013-06-09 – 2013-06-18 (×10): 30 mg via SUBCUTANEOUS
  Filled 2013-06-09 (×11): qty 0.3

## 2013-06-09 MED ORDER — ACETAMINOPHEN 325 MG PO TABS
650.0000 mg | ORAL_TABLET | ORAL | Status: DC | PRN
  Administered 2013-06-09 – 2013-06-18 (×8): 650 mg via ORAL
  Filled 2013-06-09 (×9): qty 2

## 2013-06-09 MED ORDER — DEXTROSE 5 % IV SOLN
1.0000 g | Freq: Three times a day (TID) | INTRAVENOUS | Status: DC
  Administered 2013-06-09 – 2013-06-16 (×19): 1 g via INTRAVENOUS
  Filled 2013-06-09 (×22): qty 1

## 2013-06-09 MED ORDER — DEXTROSE 5 % IV SOLN
2.0000 g | Freq: Once | INTRAVENOUS | Status: AC
  Administered 2013-06-09: 2 g via INTRAVENOUS
  Filled 2013-06-09: qty 2

## 2013-06-09 MED ORDER — FUROSEMIDE 10 MG/ML IJ SOLN
40.0000 mg | Freq: Two times a day (BID) | INTRAMUSCULAR | Status: DC
  Administered 2013-06-09 – 2013-06-10 (×2): 40 mg via INTRAVENOUS
  Filled 2013-06-09 (×3): qty 4

## 2013-06-09 MED ORDER — VANCOMYCIN HCL IN DEXTROSE 1-5 GM/200ML-% IV SOLN
1000.0000 mg | INTRAVENOUS | Status: DC
  Administered 2013-06-09 – 2013-06-11 (×2): 1000 mg via INTRAVENOUS
  Filled 2013-06-09 (×2): qty 200

## 2013-06-09 MED ORDER — ALBUTEROL SULFATE (2.5 MG/3ML) 0.083% IN NEBU
2.5000 mg | INHALATION_SOLUTION | Freq: Every day | RESPIRATORY_TRACT | Status: DC | PRN
  Administered 2013-06-14 – 2013-06-15 (×2): 2.5 mg via RESPIRATORY_TRACT
  Filled 2013-06-09 (×2): qty 3

## 2013-06-09 MED ORDER — CILOSTAZOL 100 MG PO TABS
100.0000 mg | ORAL_TABLET | Freq: Two times a day (BID) | ORAL | Status: DC
  Administered 2013-06-09 – 2013-06-10 (×3): 100 mg via ORAL
  Filled 2013-06-09 (×5): qty 1

## 2013-06-09 MED ORDER — ASPIRIN EC 81 MG PO TBEC
81.0000 mg | DELAYED_RELEASE_TABLET | Freq: Every day | ORAL | Status: DC
  Administered 2013-06-10 – 2013-06-19 (×10): 81 mg via ORAL
  Filled 2013-06-09 (×10): qty 1

## 2013-06-09 MED ORDER — TAMSULOSIN HCL 0.4 MG PO CAPS
0.4000 mg | ORAL_CAPSULE | Freq: Every day | ORAL | Status: DC
  Administered 2013-06-10 – 2013-06-13 (×4): 0.4 mg via ORAL
  Filled 2013-06-09 (×5): qty 1

## 2013-06-09 MED ORDER — INSULIN ASPART 100 UNIT/ML ~~LOC~~ SOLN
0.0000 [IU] | Freq: Three times a day (TID) | SUBCUTANEOUS | Status: DC
  Administered 2013-06-10 (×2): 1 [IU] via SUBCUTANEOUS
  Administered 2013-06-10 – 2013-06-11 (×2): 2 [IU] via SUBCUTANEOUS
  Administered 2013-06-11: 1 [IU] via SUBCUTANEOUS
  Administered 2013-06-11: 2 [IU] via SUBCUTANEOUS
  Administered 2013-06-12 (×2): 1 [IU] via SUBCUTANEOUS
  Administered 2013-06-12: 3 [IU] via SUBCUTANEOUS
  Administered 2013-06-13 (×3): 1 [IU] via SUBCUTANEOUS
  Administered 2013-06-14 (×2): 2 [IU] via SUBCUTANEOUS
  Administered 2013-06-14: 4 [IU] via SUBCUTANEOUS
  Administered 2013-06-15: 13:00:00 via SUBCUTANEOUS
  Administered 2013-06-15: 2 [IU] via SUBCUTANEOUS
  Administered 2013-06-15 – 2013-06-16 (×2): 1 [IU] via SUBCUTANEOUS
  Administered 2013-06-16: 2 [IU] via SUBCUTANEOUS
  Administered 2013-06-16: 1 [IU] via SUBCUTANEOUS
  Administered 2013-06-17: 2 [IU] via SUBCUTANEOUS
  Administered 2013-06-17: 1 [IU] via SUBCUTANEOUS
  Administered 2013-06-17: 2 [IU] via SUBCUTANEOUS
  Administered 2013-06-18: 1 [IU] via SUBCUTANEOUS

## 2013-06-09 NOTE — ED Notes (Signed)
Per EMS: Per family, noticed decreased urine production since yesterday. Family also reports pt complained of right sided flank pain. Pt denies any complaint/pain. AO at baseline. 106/48. 61 irr.
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
## 2013-06-09 NOTE — Progress Notes (Signed)
Unable to insert foley catheter x2 attempts

## 2013-06-09 NOTE — Consult Note (Addendum)
ANTIBIOTIC CONSULT NOTE - INITIAL  Pharmacy Consult for Vancomycin and Azactam (PCN allergy) Indication: rule out pneumonia  Allergies  Allergen Reactions  . Penicillins Swelling  . Sulfa Antibiotics Other (See Comments)    unknown    Patient Measurements: Height: 5' 1.81" (157 cm) Weight: 199 lb 11.8 oz (90.6 kg) IBW/kg (Calculated) : 54.17  Vital Signs: Temp: 98.1 F (36.7 C) (03/01 1404) Temp src: Oral (03/01 1404) BP: 111/49 mmHg (03/01 1522) Pulse Rate: 57 (03/01 1525) Intake/Output from previous day:   Intake/Output from this shift:    Labs:  Recent Labs  06/09/13 1432  WBC 7.1  HGB 8.5*  PLT 126*  CREATININE 2.34*   Estimated Creatinine Clearance: 19.2 ml/min (by C-G formula based on Cr of 2.34).  Microbiology: Recent Results (from the past 720 hour(s))  URINE CULTURE     Status: None   Collection Time    05/22/13  2:38 PM      Result Value Ref Range Status   Specimen Description URINE, RANDOM   Final   Special Requests NONE   Final   Culture  Setup Time     Final   Value: 05/22/2013 15:21     Performed at [facility]     Final   Value: >=100,000 COLONIES/ML     Performed at [facility]   Culture     Final   Value: Multiple bacterial morphotypes present, none predominant. Suggest appropriate recollection if clinically indicated.     Performed at [facility]   Report Status 05/23/2013 FINAL   Final    Medical History: Past Medical History  Diagnosis Date  . Macular degeneration   . Peripheral vascular disease   . TIA (transient ischemic attack)   . Low back pain   . UTI (lower urinary tract infection)   . Coronary artery disease 02/2005    PCI with DES of RCA and LAD with normal LVF  . Hypertension   . Chronic atrial fibrillation   . Carotid artery occlusion     s/p right CEA  . Hyperlipidemia   . Edema extremities   . CHF (congestive heart failure) Sept. 2014    EF 45-50%  . Bladder cancer  2001  . Altered mental status     admission 04/10/2013  . Atrial fibrillation     not a coumadin candidate  . Pneumonia     "several bouts this last year and 1/2" (05/22/2013)  . Type II diabetes mellitus   . Anemia   . Seizures     "he's had some; before they cleaned out carotid artery" (05/22/2013)  . Arthritis     "joints" (05/22/2013)  . Dementia   . PAD (peripheral artery disease)     Archie Endo 05/22/2013  . Chronic kidney disease (CKD), stage III (moderate)    Assessment: 89yom s/p recent discharge from the hospital (2/11 to 2/16) after admission for pre-syncope/dehydration returns to the ED with decreased UOP and flank pain. CXR shows progression of left lower lung airspace disease. He will begin empiric antibiotics for possible HCAP. Creatinine is elevated at 2.34 (baseline appears to be around 1.6-1.8).   Azactam 2g IV x 1 ordered in the ED  Goal of Therapy:  Vancomycin trough level 15-20 mcg/ml  Plan:  1) Vancomycin 1g IV q48 2) Azactam 1g IV q8 3) Follow renal function, cultures, LOT, level as needed   Deboraha Sprang 06/09/2013,3:46 PM

## 2013-06-09 NOTE — Progress Notes (Signed)
Patient requested something for lower back pain. PRN Tylenol administered as ordered. Patient currently resting comfortably, will continue to monitor. Tresa Endo

## 2013-06-09 NOTE — ED Notes (Signed)
New EKG changes noted including RBBB and ST elevation. EKG reviewed with Md Vanita Panda. Pt denies any CP.

## 2013-06-09 NOTE — ED Notes (Signed)
One nurse attempted IV. Second nurse to try.
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
## 2013-06-09 NOTE — ED Notes (Signed)
Admitting MD Broadus John at bedside.

## 2013-06-10 LAB — BASIC METABOLIC PANEL
BUN: 56 mg/dL — AB (ref 6–23)
CHLORIDE: 101 meq/L (ref 96–112)
CO2: 17 meq/L — AB (ref 19–32)
Calcium: 8.3 mg/dL — ABNORMAL LOW (ref 8.4–10.5)
Creatinine, Ser: 2.58 mg/dL — ABNORMAL HIGH (ref 0.50–1.35)
GFR calc Af Amer: 23 mL/min — ABNORMAL LOW (ref >90)
GFR calc non Af Amer: 20 mL/min — ABNORMAL LOW (ref >90)
Glucose, Bld: 124 mg/dL — ABNORMAL HIGH (ref 70–99)
Potassium: 3.4 mEq/L — ABNORMAL LOW (ref 3.7–5.3)
SODIUM: 134 meq/L — AB (ref 137–147)

## 2013-06-10 LAB — CBC
HEMATOCRIT: 22.9 % — AB (ref 39.0–52.0)
HEMOGLOBIN: 7.7 g/dL — AB (ref 13.0–17.0)
MCH: 33.3 pg (ref 26.0–34.0)
MCHC: 33.6 g/dL (ref 30.0–36.0)
MCV: 99.1 fL (ref 78.0–100.0)
Platelets: 111 10*3/uL — ABNORMAL LOW (ref 150–400)
RBC: 2.31 MIL/uL — AB (ref 4.22–5.81)
RDW: 17.2 % — ABNORMAL HIGH (ref 11.5–15.5)
WBC: 6 10*3/uL (ref 4.0–10.5)

## 2013-06-10 LAB — URINALYSIS, ROUTINE W REFLEX MICROSCOPIC
BILIRUBIN URINE: NEGATIVE
GLUCOSE, UA: NEGATIVE mg/dL
HGB URINE DIPSTICK: NEGATIVE
Ketones, ur: NEGATIVE mg/dL
Leukocytes, UA: NEGATIVE
Nitrite: NEGATIVE
PH: 5 (ref 5.0–8.0)
Protein, ur: NEGATIVE mg/dL
SPECIFIC GRAVITY, URINE: 1.021 (ref 1.005–1.030)
Urobilinogen, UA: 0.2 mg/dL (ref 0.0–1.0)

## 2013-06-10 LAB — STREP PNEUMONIAE URINARY ANTIGEN: Strep Pneumo Urinary Antigen: NEGATIVE

## 2013-06-10 LAB — GLUCOSE, CAPILLARY
GLUCOSE-CAPILLARY: 128 mg/dL — AB (ref 70–99)
Glucose-Capillary: 144 mg/dL — ABNORMAL HIGH (ref 70–99)
Glucose-Capillary: 174 mg/dL — ABNORMAL HIGH (ref 70–99)
Glucose-Capillary: 170 mg/dL — ABNORMAL HIGH (ref 70–99)

## 2013-06-10 MED ORDER — SODIUM CHLORIDE 0.9 % IV SOLN
INTRAVENOUS | Status: DC
  Administered 2013-06-10: 1000 mL via INTRAVENOUS
  Administered 2013-06-10: via INTRAVENOUS

## 2013-06-10 NOTE — Evaluation (Signed)
Physical Therapy Evaluation Patient Details Name: [name] MRN: [id number] DOB: 06/22/1917 Today's Date: 06/10/2013 Time: 3212-2482 PT Time Calculation (min): 25 min  PT Assessment / Plan / Recommendation History of Present Illness  [name] is a 80 y.o. male  with PMH of PAD, HTN, combined systolic and Diastolic CHF, EF 50%, atrial fibrillation, AOCD, CKD stage 3-4 at baseline and DM type 2; was brought to the ER with weakness and SOB.  Patient admitted with acute on chronic renal failure, CHF vs pna.  Clinical Impression  Patient presents with problems listed below.  Will benefit from acute PT to maximize independence prior to return home with 24/7 assist.  Recommend HHPT at discharge.    PT Assessment  Patient needs continued PT services    Follow Up Recommendations  Home health PT;Supervision/Assistance - 24 hour    Does the patient have the potential to tolerate intense rehabilitation      Barriers to Discharge        Equipment Recommendations  None recommended by PT    Recommendations for Other Services     Frequency Min 3X/week    Precautions / Restrictions Precautions Precautions: Fall Restrictions Weight Bearing Restrictions: No   Pertinent Vitals/Pain Pain in Lt great toe limiting mobility.      Mobility  Bed Mobility Overal bed mobility: Needs Assistance Bed Mobility: Supine to Sit;Sit to Supine Supine to sit: Min assist Sit to supine: Min assist General bed mobility comments: Verbal cues for technique.  Assist to raise trunk to sitting position.  Once upright, patient with good sitting balance.  Assist to bring LE's onto bed to return to supine. Transfers Overall transfer level: Needs assistance Equipment used: Rolling walker (2 wheeled) Transfers: Sit to/from Stand Sit to Stand: Mod assist General transfer comment: Cues for hand placement.  Assist to rise to standing and for balance once upright.  Patient stood x 30 seconds and needed to  return to sitting.  Patient with DOE and Lt great toe pain.    Exercises     PT Diagnosis: Difficulty walking;Generalized weakness;Acute pain;Altered mental status  PT Problem List: Decreased strength;Decreased activity tolerance;Decreased balance;Decreased mobility;Decreased cognition;Decreased knowledge of use of DME;Decreased safety awareness;Cardiopulmonary status limiting activity;Pain PT Treatment Interventions: DME instruction;Gait training;Functional mobility training;Therapeutic exercise;Balance training;Cognitive remediation;Patient/family education     PT Goals(Current goals can be found in the care plan section) Acute Rehab PT Goals Patient Stated Goal: None stated PT Goal Formulation: Patient unable to participate in goal setting Time For Goal Achievement: 06/17/13 Potential to Achieve Goals: Fair  Visit Information  Last PT Received On: 06/10/13 Assistance Needed: +2 History of Present Illness: [name] is a 80 y.o. male  with PMH of PAD, HTN, combined systolic and Diastolic CHF, EF 03%, atrial fibrillation, AOCD, CKD stage 3-4 at baseline and DM type 2; was brought to the ER with weakness and SOB.  Patient admitted with acute on chronic renal failure, CHF vs pna.       Prior [place] expects to be discharged to:: Private residence Living Arrangements: Children (son and daughter-in-law) Available Help at Discharge: Family;Personal care attendant;Available 24 hours/day Type of Home: House Home Access: Stairs to enter [employer] of Steps: 4 Entrance Stairs-Rails: Right;Left Home Layout: One level Home Equipment: Walker - 2 wheels;Bedside commode;Wheelchair - manual Additional Comments: Information from chart.  Patient providing differing information (12 steps, hospital bed). Prior Function Level of Independence: Needs assistance Gait / Transfers Assistance Needed: Ambulates  with RW with assist.  Transfers to w/c with  assist. ADL's / Homemaking Assistance Needed: Total assist with bathing and grooming, and meal prep.  Feeds himself Communication Communication: HOH Dominant Hand: Right    Cognition  Cognition Arousal/Alertness: Awake/alert Behavior During Therapy: Flat affect Overall Cognitive Status: No family/caregiver present to determine baseline cognitive functioning Area of Impairment: Orientation;Memory;Following commands;Safety/judgement Orientation Level: Disoriented to;Time;Situation Memory: Decreased short-term memory Following Commands: Follows one step commands with increased time Safety/Judgement: Decreased awareness of safety    Extremity/Trunk Assessment Upper Extremity Assessment Upper Extremity Assessment: Generalized weakness Lower Extremity Assessment Lower Extremity Assessment: Generalized weakness Cervical / Trunk Assessment Cervical / Trunk Assessment: Kyphotic   Balance Balance Overall balance assessment: Needs assistance Sitting-balance support: Single extremity supported;Feet supported Sitting balance-Leahy Scale: Good Standing balance support: Bilateral upper extremity supported Standing balance-Leahy Scale: Poor Standing balance comment: Able to stand only 30 seconds with mod assist.  Patient with DOE and Lt great toe pain.  Returned to sitting  End of Session PT - End of Session Equipment Utilized During Treatment: Gait belt Activity Tolerance: Patient limited by fatigue;Patient limited by pain Patient left: in bed;with call bell/phone within reach;with bed alarm set Nurse Communication: Mobility status;Patient requests pain meds  GP     Despina Pole 06/10/2013, 10:16 AM Carita Pian. Sanjuana Kava, [place]att[place] Pager [phone]

## 2013-06-10 NOTE — Care Management Note (Addendum)
  Page 2 of 2   06/19/2013     4:09:42 PM   CARE MANAGEMENT NOTE 06/19/2013  Patient:  [name], [name]   Account Number:  [id number]  Date Initiated:  06/10/2013  Documentation initiated by:  [name]  Subjective/Objective Assessment:   80 y.o. male  with PMH of PAD, HTN, combined systolic and Diastolic CHF, EF 66%, AFIB, AOCD, CKD stage 3-4 at baseline and DM type 2; was brought to the ER with SOB, weakness//Home with children     Action/Plan:   -will Stop IVF in ER  -IV lasix 40mg  Q12 as BP tolerates, if creatinine worsens will need to back off on diuretics-place foley, strict I/Os//Offer [facility] services   Anticipated DC Date:  06/13/2013   Anticipated DC Plan:  [place]  CM consult      [name]  HOME HEALTH   Luna offered to / List presented to:  C-1 Patient        [place] arranged  HH-2 PT      [facility]   Status of service:  Completed, signed off Medicare Important Message given?   (If response is "NO", the following Medicare IM given date fields will be blank) Date Medicare IM given:   Date Additional Medicare IM given:    Discharge Disposition:    Per UR Regulation:    If discussed at Long Length of Stay Meetings, dates discussed:   06/18/2013  06/20/2013    Comments:  06/19/2013 DME in the home D/c to home via ambulance transport. Brent Ederer RN, BSN, [place], CCM 06/19/2013  06/18/2013 CM spoke with POA/DTR/[name] via phone today. Social:  From home with Son/[name] [phone] and [name] [phone] or [phone]. POA/DTR/[name] [phone] gives pemission to discuss home needs with [name] or [name] as patient lives with [name]. Private pay sitter/[name] 8-1 or 8-5 M-F. Patient/DTR elect [facility] has cancelled [facility] with [facility]:  Package D and B  (O2) has been ordered with [facility]. ADD: 1-2 (IV lasix d/c this am and transition to po Lasix) Probable need for ambulance transport at d/c. Brent Teague RN, BSN, [place], CCM 06/18/2013  06/18/13 Brent Mandril, RN, BSN, NCM [phone] Pt active with [facility] services.  Brent Sauce, RN of [facility] notified to d/c pt from [place] [facility] referral and Rhett Bannister of [place] notified to resume care.  06/17/2013 Social:  Home with familiy Palliative Consult 06/17/2013 IV Lasix 100mg  BID Dispositon Plan:  Home with [facility]) notified 06/11/13 Brent Alleva RN, BSN, [name], CCM 06/17/2013  06/11/13 [place], RN, BSN, [name] [phone] Spoke with pt at bedside regarding discharge planning for [facility]. Offered pt list of home health agencies to choose from.  Pt chose [facility] to render services. Brent Sauce, RN of [facility] notified.  No DME needs identified at this time.

## 2013-06-10 NOTE — Progress Notes (Signed)
Subjective: Some cough  Objective: Vital signs in last 24 hours: Temp:  [97.4 F (36.3 C)-98.9 F (37.2 C)] 98.9 F (37.2 C) (03/02 0457) Pulse Rate:  [51-87] 51 (03/02 0457) Resp:  [22-30] 22 (03/02 0457) BP: (95-118)/(39-88) 95/44 mmHg (03/02 0457) SpO2:  [93 %-100 %] 99 % (03/02 0457) Weight:  [90.6 kg (199 lb 11.8 oz)-96.7 kg (213 lb 3 oz)] 96.7 kg (213 lb 3 oz) (03/02 0457) Weight change:  Last BM Date:  (patient unsure)  Intake/Output from previous day: 03/01 0701 - 03/02 0700 In: 640 [P.O.:440; I.V.:200] Out: -  Intake/Output this shift:    General appearance: alert Resp: clear to auscultation bilaterally Cardio: irregularly irregular rhythm GI: soft, non-tender; bowel sounds normal; no masses,  no organomegaly Extremities: extremities normal, atraumatic, no cyanosis or edema  Lab Results:  Recent Labs  06/09/13 1432 06/10/13 0340  WBC 7.1 6.0  HGB 8.5* 7.7*  HCT 24.9* 22.9*  PLT 126* 111*   BMET  Recent Labs  06/09/13 1432 06/10/13 0340  NA 136* 134*  K 3.7 3.4*  CL 103 101  CO2 18* 17*  GLUCOSE 137* 124*  BUN 48* 56*  CREATININE 2.34* 2.58*  CALCIUM 8.5 8.3*    Studies/Results: Dg Chest 2 View  06/09/2013   CLINICAL DATA:  Chest congestion  EXAM: CHEST - 2 VIEW  COMPARISON:  05/22/2013  FINDINGS: Coarse airspace opacities throughout the right lung are stable. Progressive confluent opacities at the left lung base . Mild cardiomegaly. Tortuous aorta. . No effusion. Advanced degenerative change in the right shoulder.  IMPRESSION: Some interval progression of coarse left lower lung airspace disease.   Electronically Signed   By: Arne Cleveland M.D.   On: 06/09/2013 15:12    Medications: I have reviewed the patient's current medications.  Assessment/Plan: 1. Acute on chronic Renal Failure creatinine rising with diuretics, no edema and CXR, will stop diuretics and give gentle IVFs.  2. Possible Pneumonia, continue antibiotics for now, O2sat  96%RA  3. Chronic Combined systolic and Diastolic CHF stable, NoACEI given renal function and no beta blocker given bradycardia   4. Anemia of chronic disease follow, consider transfusion  5. DM  -hold oral hypoglycemics, SSI   6. HTN  -BP soft, hold imdur     DVT proph: lovenox  Code Status: DNR Family Communication: d/w son and daughter  Disposition Plan: home    LOS: 1 day   [name] JOSEPH 06/10/2013, 7:12 AM

## 2013-06-10 NOTE — Progress Notes (Signed)
Pt's daughter Raliegh Ip. Jeneen Rinks wanted up date.  Dr. Lavone Orn made aware and instructed to leave phone number on chart.  No. Placed on chart for MD.  Karie Kirks, RN.

## 2013-06-10 NOTE — Progress Notes (Addendum)
Patient recently became active with [facility] at last hospital discharge. [facility] had a scheduled home visit for today at 10 am. However, patient had been readmitted to hospital. He is also active with [place] [facility] home health as well. Lives with son and daugther in law and has caregiver assist. Questioning whether patient would be appropriate for goals of care or palliative consult while in hospital. Will make inpatient RNCM aware [facility] recently engaged patient to follow at home.  [name], MSN- [facility] [phone]

## 2013-06-10 NOTE — Progress Notes (Signed)
Pt K 3.4 at 0340 lab this am.  Admitted with acute renal failure.  Call placed to MD on call, Dr. Maxwell Caul.  Awaiting return call.  On coming nurse made aware of call place and to inform MD.  Verbalized understanding.  Karie Kirks, Therapist, sports.

## 2013-06-10 NOTE — Progress Notes (Signed)
Per monitor strip tonight, pt in Junctional rhythm, rhythm the same as in previous strips today that were labeled "unknown rhythm". Per ED MD note on admission, pt in Junctional rhythm on ED EKG. Pt VSS tonight, HR in 50s, pt asymptomatic. Will continue to monitor. Ronnette Hila, RN

## 2013-06-10 NOTE — Progress Notes (Signed)
Notified by CMT that patient was in a complete heart block. EKG done to confirm, but according to the EKG patient was not in heart block.  EKG showed patient was in an irregular QRS rhythm with wide complexes.  When compared to prior EKG's, patient's rhythm appears to be consistently irregular.  Patient remained asymptomatic. Vitals were done and were WNL at patient's baseline.  EKG strip to chart, will continue to monitor. Blood pressure 107/55, pulse 72, temperature 98.1 F (36.7 C), temperature source Oral, resp. rate 22, height 5' 1.81" (1.57 m), weight 96.7 kg (213 lb 3 oz), SpO2 100.00%. Tresa Endo

## 2013-06-10 NOTE — Progress Notes (Signed)
Dr. Isidoro Donning notified of potassium of 3.4, stated will re-check in AM. No new orders Ronnette Hila, RN

## 2013-06-11 ENCOUNTER — Inpatient Hospital Stay (HOSPITAL_COMMUNITY): Payer: Medicare HMO

## 2013-06-11 ENCOUNTER — Telehealth: Payer: Self-pay | Admitting: Cardiology

## 2013-06-11 DIAGNOSIS — I10 Essential (primary) hypertension: Secondary | ICD-10-CM

## 2013-06-11 DIAGNOSIS — I4892 Unspecified atrial flutter: Secondary | ICD-10-CM | POA: Diagnosis present

## 2013-06-11 DIAGNOSIS — I5043 Acute on chronic combined systolic (congestive) and diastolic (congestive) heart failure: Secondary | ICD-10-CM

## 2013-06-11 DIAGNOSIS — I359 Nonrheumatic aortic valve disorder, unspecified: Secondary | ICD-10-CM

## 2013-06-11 DIAGNOSIS — I509 Heart failure, unspecified: Secondary | ICD-10-CM

## 2013-06-11 LAB — GLUCOSE, CAPILLARY
GLUCOSE-CAPILLARY: 191 mg/dL — AB (ref 70–99)
Glucose-Capillary: 136 mg/dL — ABNORMAL HIGH (ref 70–99)
Glucose-Capillary: 177 mg/dL — ABNORMAL HIGH (ref 70–99)
Glucose-Capillary: 191 mg/dL — ABNORMAL HIGH (ref 70–99)

## 2013-06-11 LAB — BASIC METABOLIC PANEL
BUN: 65 mg/dL — ABNORMAL HIGH (ref 6–23)
CHLORIDE: 105 meq/L (ref 96–112)
CO2: 15 mEq/L — ABNORMAL LOW (ref 19–32)
CREATININE: 3.05 mg/dL — AB (ref 0.50–1.35)
Calcium: 8.2 mg/dL — ABNORMAL LOW (ref 8.4–10.5)
GFR calc Af Amer: 19 mL/min — ABNORMAL LOW (ref >90)
GFR calc non Af Amer: 16 mL/min — ABNORMAL LOW (ref >90)
Glucose, Bld: 164 mg/dL — ABNORMAL HIGH (ref 70–99)
POTASSIUM: 4.4 meq/L (ref 3.7–5.3)
SODIUM: 137 meq/L (ref 137–147)

## 2013-06-11 LAB — BLOOD GAS, ARTERIAL
ACID-BASE DEFICIT: 8.2 mmol/L — AB (ref 0.0–2.0)
BICARBONATE: 15.6 meq/L — AB (ref 20.0–24.0)
Drawn by: 33234
FIO2: 0.21 %
O2 SAT: 83.2 %
PATIENT TEMPERATURE: 98.6
TCO2: 16.3 mmol/L (ref 0–100)
pCO2 arterial: 25.2 mmHg — ABNORMAL LOW (ref 35.0–45.0)
pH, Arterial: 7.407 (ref 7.350–7.450)
pO2, Arterial: 50.2 mmHg — ABNORMAL LOW (ref 80.0–100.0)

## 2013-06-11 LAB — CBC
HCT: 24.5 % — ABNORMAL LOW (ref 39.0–52.0)
Hemoglobin: 8.4 g/dL — ABNORMAL LOW (ref 13.0–17.0)
MCH: 34 pg (ref 26.0–34.0)
MCHC: 34.3 g/dL (ref 30.0–36.0)
MCV: 99.2 fL (ref 78.0–100.0)
PLATELETS: DECREASED 10*3/uL (ref 150–400)
RBC: 2.47 MIL/uL — AB (ref 4.22–5.81)
RDW: 16.9 % — AB (ref 11.5–15.5)
WBC: 6.7 10*3/uL (ref 4.0–10.5)

## 2013-06-11 LAB — LEGIONELLA ANTIGEN, URINE: LEGIONELLA ANTIGEN, URINE: NEGATIVE

## 2013-06-11 LAB — LACTIC ACID, PLASMA: Lactic Acid, Venous: 1.5 mmol/L (ref 0.5–2.2)
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
## 2013-06-11 NOTE — Progress Notes (Signed)
Report given to receiving RN. Patient in bed resting. Family seen leaving the unit.  No verbal complaints and no signs or symptoms of distress or discomfort.

## 2013-06-11 NOTE — Progress Notes (Signed)
Subjective: No complaints  Objective: Vital signs in last 24 hours: Temp:  [97.2 F (36.2 C)-98.3 F (36.8 C)] 98.3 F (36.8 C) (03/03 0519) Pulse Rate:  [50-53] 52 (03/03 0519) Resp:  [16-20] 19 (03/03 0519) BP: (102-114)/(38-44) 104/38 mmHg (03/03 0519) SpO2:  [99 %] 99 % (03/03 0519) Weight:  [97.8 kg (215 lb 9.8 oz)] 97.8 kg (215 lb 9.8 oz) (03/03 0519) Weight change: 7.2 kg (15 lb 14 oz) Last BM Date:  (pt unsure)  Intake/Output from previous day: 03/02 0701 - 03/03 0700 In: 1230 [P.O.:1080; IV Piggyback:150] Out: 680 [Urine:680] Intake/Output this shift: Total I/O In: -  Out: 280 [Urine:280]  General appearance: alert and cooperative Resp: B end expiratory wheezes Cardio: bradycardic GI: soft, non-tender; bowel sounds normal; no masses,  no organomegaly Extremities: extremities normal, atraumatic, no cyanosis or edema  Lab Results:  Recent Labs  06/10/13 0340 06/11/13 0348  WBC 6.0 PENDING  HGB 7.7* 8.4*  HCT 22.9* 24.5*  PLT 111* PENDING   BMET  Recent Labs  06/10/13 0340 06/11/13 0348  NA 134* 137  K 3.4* 4.4  CL 101 105  CO2 17* 15*  GLUCOSE 124* 164*  BUN 56* 65*  CREATININE 2.58* 3.05*  CALCIUM 8.3* 8.2*    Studies/Results: Dg Chest 2 View  06/09/2013   CLINICAL DATA:  Chest congestion  EXAM: CHEST - 2 VIEW  COMPARISON:  05/22/2013  FINDINGS: Coarse airspace opacities throughout the right lung are stable. Progressive confluent opacities at the left lung base . Mild cardiomegaly. Tortuous aorta. . No effusion. Advanced degenerative change in the right shoulder.  IMPRESSION: Some interval progression of coarse left lower lung airspace disease.   Electronically Signed   By: Arne Cleveland M.D.   On: 06/09/2013 15:12    Medications: I have reviewed the patient's current medications.  Assessment/Plan: 1. Acute on chronic Renal Failure creatinine continues to rise with IVFs, will stop IV fluids. Metabolic acidosis, check ABG and lactate 2.  Possible Pneumonia, continue antibiotics for now, O2sat 96%RA, recheck CXR 3. Chronic Combined systolic and Diastolic CHF stable, NoACEI given renal function and no beta blocker given bradycardia (junctional rhythm). Will ask cardiology to see, prognosis may be guarded given declining renal function. Recheck CXR 4. Anemia of chronic disease follow,  5. DM ssi 6. HTN  -BP soft, hold imdur 7. No Code Blue. Consider palliative consult    LOS: 2 days   [facility] 06/11/2013, 5:37 AM

## 2013-06-11 NOTE — Clinical Documentation Improvement (Signed)
  Per ED Note "...concern of possible pneumonia. Given the patient's history of aspiration events he was started on empiric antibiotics", placed on Vancomycin and Azactam IV. If possible, please clarify suspected/possible/probable type of pneumonia to better illustrate severity of illness and risk of mortality. Thank you.   Possible Clinical Conditions?  - Aspiration Pneumonia (POA?) - Gram Negative Pneumonia (POA?) - Bacterial pneumonia, specify type if known (POA?) - Influenza PNA - Pneumonia (CAP, HAP) (POA?) - Other Condition  Thank You, Ezekiel Ina ,RN Clinical Documentation Specialist:  [phone]  [facility] Information Management

## 2013-06-11 NOTE — Telephone Encounter (Signed)
New Message  Pt daughter is requesting a call back to discuss Pt's conditions while in the hospital.. Please call

## 2013-06-11 NOTE — Telephone Encounter (Signed)
Pts daugther called very concerned about ECHO. She wants to know if the EF has lowered if there is a plan to use any meds or anything to help with the EF.

## 2013-06-12 ENCOUNTER — Ambulatory Visit (HOSPITAL_COMMUNITY): Admission: RE | Admit: 2013-06-12 | Payer: Medicare HMO | Source: Ambulatory Visit | Admitting: Vascular Surgery

## 2013-06-12 ENCOUNTER — Encounter (HOSPITAL_COMMUNITY): Admission: RE | Payer: Self-pay | Source: Ambulatory Visit

## 2013-06-12 ENCOUNTER — Inpatient Hospital Stay (HOSPITAL_COMMUNITY): Payer: Medicare HMO

## 2013-06-12 DIAGNOSIS — N179 Acute kidney failure, unspecified: Secondary | ICD-10-CM

## 2013-06-12 DIAGNOSIS — J9620 Acute and chronic respiratory failure, unspecified whether with hypoxia or hypercapnia: Secondary | ICD-10-CM

## 2013-06-12 DIAGNOSIS — N189 Chronic kidney disease, unspecified: Secondary | ICD-10-CM

## 2013-06-12 DIAGNOSIS — J189 Pneumonia, unspecified organism: Secondary | ICD-10-CM

## 2013-06-12 DIAGNOSIS — R609 Edema, unspecified: Secondary | ICD-10-CM

## 2013-06-12 LAB — CBC
HCT: 20.4 % — ABNORMAL LOW (ref 39.0–52.0)
HEMOGLOBIN: 7.4 g/dL — AB (ref 13.0–17.0)
MCH: 35.4 pg — ABNORMAL HIGH (ref 26.0–34.0)
MCHC: 36.3 g/dL — ABNORMAL HIGH (ref 30.0–36.0)
MCV: 97.6 fL (ref 78.0–100.0)
PLATELETS: DECREASED 10*3/uL (ref 150–400)
RBC: 2.09 MIL/uL — AB (ref 4.22–5.81)
RDW: 16.8 % — ABNORMAL HIGH (ref 11.5–15.5)
WBC: 6.7 10*3/uL (ref 4.0–10.5)

## 2013-06-12 LAB — BASIC METABOLIC PANEL
BUN: 83 mg/dL — ABNORMAL HIGH (ref 6–23)
CALCIUM: 8.4 mg/dL (ref 8.4–10.5)
CO2: 14 mEq/L — ABNORMAL LOW (ref 19–32)
Chloride: 106 mEq/L (ref 96–112)
Creatinine, Ser: 3.77 mg/dL — ABNORMAL HIGH (ref 0.50–1.35)
GFR, EST AFRICAN AMERICAN: 15 mL/min — AB (ref >90)
GFR, EST NON AFRICAN AMERICAN: 13 mL/min — AB (ref >90)
Glucose, Bld: 146 mg/dL — ABNORMAL HIGH (ref 70–99)
POTASSIUM: 4.6 meq/L (ref 3.7–5.3)
Sodium: 139 mEq/L (ref 137–147)

## 2013-06-12 LAB — GLUCOSE, CAPILLARY
GLUCOSE-CAPILLARY: 138 mg/dL — AB (ref 70–99)
GLUCOSE-CAPILLARY: 150 mg/dL — AB (ref 70–99)
GLUCOSE-CAPILLARY: 161 mg/dL — AB (ref 70–99)
Glucose-Capillary: 209 mg/dL — ABNORMAL HIGH (ref 70–99)

## 2013-06-12 LAB — PREPARE RBC (CROSSMATCH)

## 2013-06-12 SURGERY — ANGIOGRAM, LOWER EXTREMITY
Anesthesia: Monitor Anesthesia Care | Site: Groin | Laterality: Left

## 2013-06-12 MED ORDER — SODIUM CHLORIDE 0.9 % IV BOLUS (SEPSIS)
200.0000 mL | Freq: Once | INTRAVENOUS | Status: AC
  Administered 2013-06-12: 200 mL via INTRAVENOUS

## 2013-06-12 NOTE — Progress Notes (Signed)
Pt direct monitored after first unit of PRBC started, VSS, no adverse reaction noticed. We'll continue to monitor.

## 2013-06-12 NOTE — Progress Notes (Signed)
Patient discussed during unit progression.  Made inpatient team aware that patient is [facility] active.  Requested LCSW to implement a goals of care meeting.  In to visit with patient at bedside.  Made him aware of liaison role.  Will continue to monitor.  Of note, [facility] Care Management services does not replace or interfere with any services that are arranged by inpatient case management or social work.  For additional questions or referrals please contact Corliss Blacker BSN RN [facility] Liaison at [phone].

## 2013-06-12 NOTE — Progress Notes (Addendum)
Pt assessment completed, pt denies any pain or discomfort at this time, family member at the bedside.  Pt's HR on 35-39, BP low 88/50 MD aware, pt locks very weak at this time. Pt due for blood transfusion, consent gotten awaiting at this time for type and screen to be done.

## 2013-06-12 NOTE — Progress Notes (Signed)
Called for more frequent intermittent brady down to upper 30's. Baseline HR 40's-50's per notes. This has been a continued issue for this patient during his stay as he has atrial flutter with slow VR and underlying conduction abnormalities. BP is 90/38 with some mild dizziness. Prognosis has been felt to be poor per notes given rising Cr, advanced age, comorbidities. Family has been aware of this per notes. D/w Dr. Domenic Polite. Will continue conservative management for now including small fluid bolus to help bolster pressure. Primary team is considering palliative approach. Have asked nursing to contact them to find out if they want to move patient, although not sure this would change anything as we are not currently planning any invasive procedures. Continue to monitor. Dayna Dunn PA-C

## 2013-06-12 NOTE — Progress Notes (Signed)
First unit of PRBC started as ordered.

## 2013-06-12 NOTE — Progress Notes (Signed)
Subjective: No complaints  Objective: Vital signs in last 24 hours: Temp:  [98.2 F (36.8 C)-98.4 F (36.9 C)] 98.4 F (36.9 C) (03/03 1937) Pulse Rate:  [57] 57 (03/03 1937) Resp:  [20-22] 22 (03/03 1937) BP: (105-122)/(39-47) 105/39 mmHg (03/03 1937) SpO2:  [98 %-100 %] 98 % (03/03 1937) Weight change:  Last BM Date:  (patient unsure)  Intake/Output from previous day: 03/03 0701 - 03/04 0700 In: 960 [P.O.:360; IV Piggyback:600] Out: 350 [Urine:350] Intake/Output this shift:    General appearance: alert Resp: rales bibasilar Cardio: regular rate and rhythm, S1, S2 normal, no murmur, click, rub or gallop GI: soft, non-tender; bowel sounds normal; no masses,  no organomegaly Extremities: extremities normal, atraumatic, no cyanosis or edema  Lab Results:  Recent Labs  06/11/13 0348 06/12/13 0453  WBC 6.7 6.7  HGB 8.4* 7.4*  HCT 24.5* 20.4*  PLT PLATELET CLUMPS NOTED ON SMEAR, COUNT APPEARS DECREASED PLATELET CLUMPS NOTED ON SMEAR, COUNT APPEARS DECREASED   BMET  Recent Labs  06/11/13 0348 06/12/13 0453  NA 137 139  K 4.4 4.6  CL 105 106  CO2 15* 14*  GLUCOSE 164* 146*  BUN 65* 83*  CREATININE 3.05* 3.77*  CALCIUM 8.2* 8.4    Studies/Results: Dg Chest 2 View  06/11/2013   CLINICAL DATA:  Short of breath.  Cough.  EXAM: CHEST  2 VIEW  COMPARISON:  06/09/2013  FINDINGS: Coarse interstitial and airspace opacities in the lungs are stable. Lung volumes are relatively low. Cardiac silhouette is mildly enlarged. No pneumothorax or convincing effusion.  IMPRESSION: No change from the most recent prior study. Persistent bilateral coarse interstitial and airspace opacities suggests multifocal pneumonia.   Electronically Signed   By: Lajean Manes M.D.   On: 06/11/2013 13:34    Medications: I have reviewed the patient's current medications.  Assessment/Plan: 1. Acute on chronic Renal Failure, oliguric creatinine continued to rise with IVFs, currently on no fluids or  diuretics.  Prognosis is guarded.  I spoke with the patient's daughter, she understands prognosis.  She requested a nephrology consultation, will ask them to see patient. Will also get renal ultrasound to r/o obstruction. Daughter understands patient is likely not a dialysis candidate.   2.  Pneumonia,CXR and congested cough consistent with pneumonia, continue antibiotics,  3. Chronic Combined systolic and Diastolic CHF stable, NoACEI given renal function and no beta blocker given bradycardia . Cardiology input noted. Echo shows near normal EF with grade II diastolic dysfunction 4. Anemia of chronic disease follow, would not transfuse at this point 5. DM ssi controlled 6. HTN   7. No Code Blue. Consider palliative consult   LOS: 3 days   [name] JOSEPH 06/12/2013, 7:42 AM

## 2013-06-12 NOTE — Telephone Encounter (Signed)
His heart function is fine by recent echo.  His CHF is due to kidney failure and volume overload which Dr. Laurann Montana explained to her today.  No other medical therapy for heart recommended at this time

## 2013-06-12 NOTE — Progress Notes (Addendum)
Patient Name: [name] Date of Encounter: 06/12/2013   Principal Problem:   Acute and chronic respiratory failure Active Problems:   Acute on chronic renal failure   Acute on chronic combined systolic and diastolic congestive heart failure   Community acquired pneumonia   CKD (chronic kidney disease), stage IV   Hypoxia   Hypertension   Coronary artery disease   Hyperlipidemia   Atrial flutter   SUBJECTIVE Elderly gentleman resting in recumbent position with eyes closed, breathing equal bilaterally. NAD. Wakes to voice. Vocalizes that he is tired and doesn't want to be bothered anymore.  No chest pain or dyspnea.  CURRENT MEDS . aspirin EC  81 mg Oral Daily  . aztreonam  1 g Intravenous 3 times per day  . enoxaparin (LOVENOX) injection  30 mg Subcutaneous Q24H  . insulin aspart  0-9 Units Subcutaneous TID WC  . tamsulosin  0.4 mg Oral QPC breakfast  . vancomycin  1,000 mg Intravenous Q48H   OBJECTIVE  Filed Vitals:   06/11/13 1042 06/11/13 1400 06/11/13 1937 06/12/13 0941  BP: 116/41 122/47 105/39 99/46  Pulse: 57 57 57 51  Temp:  98.2 F (36.8 C) 98.4 F (36.9 C)   TempSrc:  Oral Oral   Resp:  20 22   Height:      Weight:      SpO2:  100% 98%     Intake/Output Summary (Last 24 hours) at 06/12/13 1112 Last data filed at 06/12/13 0854  Gross per 24 hour  Intake   1080 ml  Output    351 ml  Net    729 ml   Filed Weights   06/09/13 1830 06/10/13 0457 06/11/13 0519  Weight: 213 lb 3 oz (96.7 kg) 213 lb 3 oz (96.7 kg) 215 lb 9.8 oz (97.8 kg)   PHYSICAL EXAM  General: pleasant, NAD.  Neuro: Awakes to voice. Alert and oriented X 3. Moves all extremities spontaneously. Psych: Flat affect. HEENT:  [place], AT. Sclera non-icteric, Conjunctiva non-injected. No lymphedema or thyromegaly.     Neck: Supple without bruits. JVD difficult to determine due to neck girth and skin folds. Carotid upstroke 1+.  Lungs:  Resp regular and unlabored. Diminished bilaterally  with rhonchi and exp wheezing throughout.  Heart: RRR - distant. No s3, s4, or murmurs. Cap refill <3 sec. Skin warm.  Abdomen: Protuberant. Soft, non-tender, non-distended, BS + x 4.  Extremities: No clubbing, cyanosis or edema. DP/PT/Radials 1+ and equal bilaterally.  Accessory Clinical Findings  CBC  Recent Labs  06/09/13 1432  06/11/13 0348 06/12/13 0453  WBC 7.1  < > 6.7 6.7  NEUTROABS 5.4  --   --   --   HGB 8.5*  < > 8.4* 7.4*  HCT 24.9*  < > 24.5* 20.4*  MCV 100.0  < > 99.2 97.6  PLT 126*  < > PLATELET CLUMPS NOTED ON SMEAR, COUNT APPEARS DECREASED PLATELET CLUMPS NOTED ON SMEAR, COUNT APPEARS DECREASED  < > = values in this interval not displayed. Basic Metabolic Panel  Recent Labs  06/11/13 0348 06/12/13 0453  NA 137 139  K 4.4 4.6  CL 105 106  CO2 15* 14*  GLUCOSE 164* 146*  BUN 65* 83*  CREATININE 3.05* 3.77*  CALCIUM 8.2* 8.4   Liver Function Tests  Recent Labs  06/09/13 1432  AST 47*  ALT 17  ALKPHOS 142*  BILITOT 0.3  PROT 6.9  ALBUMIN 2.4*   TELE  Aflutter slow v response -  40's with intermittent bbb.  Radiology/Studies  Dg Chest 2 View  06/11/2013   CLINICAL DATA:  Short of breath.  Cough.  EXAM: CHEST  2 VIEW  COMPARISON:  06/09/2013  FINDINGS: Coarse interstitial and airspace opacities in the lungs are stable. Lung volumes are relatively low. Cardiac silhouette is mildly enlarged. No pneumothorax or convincing effusion.  IMPRESSION: No change from the most recent prior study. Persistent bilateral coarse interstitial and airspace opacities suggests multifocal pneumonia.   Electronically Signed   By: Lajean Manes M.D.   On: 06/11/2013 13:34   2D Echocardiogram 3.3.2015  Study Conclusions  - Left ventricle: There was mild concentric hypertrophy. Systolic function was normal. The estimated ejection fraction was in the range of 50% to 55%. Features are consistent with a pseudonormal left ventricular filling pattern, with concomitant  abnormal relaxation and increased filling pressure (grade 2 diastolic dysfunction). - Aortic valve: Severe thickening and calcification. There was mild stenosis. Mild regurgitation. Valve area: 1.31cm^2(VTI). Valve area: 1.27cm^2 (Vmax). - Mitral valve: Severely calcified annulus. Mildly thickened leaflets . Valve area by continuity equation (using LVOT flow): 1.26cm^2. - Left atrium: The atrium was mildly dilated. - Pulmonary arteries: PA peak pressure: 68mm Hg (S). _____________   ASSESSMENT AND PLAN  80 yr old gentleman with sig hx of PAD, HTN, combined systolic and Diastolic CHF, EF 64-40% (06/13/7423), atrial flutter, CKD stage 3-4 at baseline and DM type 2; arrived to [facility] with complaints of weakness and SOB. Recently discharged from [facility] 15 days ago after admission for AKI and UTI. At this time his lasix was stopped and discharged home to the care of his family. Shortly after discharge patient became SOB upon exertion with accompanying cough. His son administered PO lasix x2 days; however the patient's urine output decreased and his symptoms worsened.  Upon arrival to [facility] on 3/1 the patient's renal function has worsened since last admission with an elevated Pro-BNP of >24K. He was placed on a lasix gtt for treatment of AOC systolic/diastolic HF which worsened his renal function. Gentle IV resuscitation was utilized however no improvement in renal function occured (22:1).    1. Acute on chronic diastolic HF: Wt currently up to 215.  His d/c weight on 2/11 was 199 lbs.  Repeat echo yesterday showed nl LV fxn with grade 2 DD, thus he is not low output.  His HR and BP are well controlled. Creat continues to rise as he has been off of diuretics for 2 days now.  Renal U/S pending to r/o obstructive dzs.  Renal consult pending.  Defer diuretic mgmt to nephrology.  2. Acute on chronic stage IV kidney dzs:  As above.  3.  PNA:  abx per IM.  4.  CAD:  No chest pain.  5.  Aflutter: slow VR  response.  Stable.  6.  Anemia:  H/H down today.  Signed, Hulen Luster, [employer] STUDENT- NP Murray Hodgkins, NP 06/12/2013 11:32 AM   Seen and examined. Agree with above.  ARF. Normal EF. Diastolic HF. May be degree of high output HF from anemia. With worsening Creat to 3.77 will defer lasix to nephro team. Guarded prognosis.  Candee Furbish, MD

## 2013-06-12 NOTE — Progress Notes (Signed)
Patient HR has been consistently decreasing as low as 37-40. Patient is asymptomatic with no verbal complaints. No complaints of pain, feeling of dizziness or lightheaded. Manual BP obtained 90/38 and HR is 39. PA notified and new orders given. Will proceed to initiate 200 cc Bolus.

## 2013-06-12 NOTE — Progress Notes (Addendum)
Report given to receiving RN. Patient in bed resting with family at bedside. Patient skin appearance is dusky and pale. HR continues to brady as low as 37. Bolus complete. Recheck BP is 88/50 and HR is 39. No verbal complaints and no signs nor symptoms of distress or discomfort.

## 2013-06-12 NOTE — Progress Notes (Signed)
Physical Therapy Treatment Patient Details Name: [name] MRN: [id number] DOB: 06-24-1917 Today's Date: 06/12/2013 Time: 6553-7482 PT Time Calculation (min): 26 min  PT Assessment / Plan / Recommendation  History of Present Illness pt to have renal consult   PT Comments   [name] appears deconditioned but is willing to work with PT. His daughter wants to know if he is insurance eligible to go to SNF for rehab as he has been there recently  Follow Up Recommendations  Supervision/Assistance - 24 hour;SNF (daughter would like to know if he is eligible to return to SNF for short term)     Does the patient have the potential to tolerate intense rehabilitation     Barriers to Discharge        Equipment Recommendations  None recommended by PT    Recommendations for Other Services    Frequency Min 3X/week   Progress towards PT Goals Progress towards PT goals: Not progressing toward goals - comment (pt very weak and deconditioned)  Plan Discharge plan needs to be updated    Precautions / Restrictions Precautions Precautions: Fall Restrictions Weight Bearing Restrictions: No   Pertinent Vitals/Pain Nasal O2 maintained.  O2 sats> 90%.  Pt states he is "sore all over"  Has just received pain med per daughter    Mobility  Bed Mobility Overal bed mobility: Needs Assistance Bed Mobility: Supine to Sit;Sit to Supine Supine to sit: Mod assist General bed mobility comments: needs assist to raise trunk to sitting Transfers Overall transfer level: Needs assistance Equipment used: Rolling walker (2 wheeled) Transfers: Sit to/from [facility] Sit to Stand: Mod assist Stand pivot transfers: Mod assist General transfer comment: pt needs extra time and consistent cues for hand placement to push up and also to bring chest up into standing upright. pt tends to loudly grunt with exertional effort Ambulation/Gait General Gait Details: standing only this session x 30  seconds x 3 repetitions    Exercises General Exercises - Lower Extremity Ankle Circles/Pumps: AAROM;Both;5 reps;Supine;Seated   PT Diagnosis:    PT Problem List:   PT Treatment Interventions:     PT Goals (current goals can now be found in the care plan section)    Visit Information  Last PT Received On: 06/12/13 Assistance Needed: +2 History of Present Illness: pt to have renal consult    Subjective Data      Cognition  Cognition Arousal/Alertness: Awake/alert Behavior During Therapy: Flat affect Overall Cognitive Status: No family/caregiver present to determine baseline cognitive functioning Area of Impairment: Orientation;Memory;Following commands;Safety/judgement Orientation Level: Disoriented to;Time;Situation Memory: Decreased short-term memory Following Commands: Follows one step commands with increased time Safety/Judgement: Decreased awareness of safety    Balance  Balance Overall balance assessment: Needs assistance Sitting-balance support: Single extremity supported;Feet supported Sitting balance-Leahy Scale: Good Sitting balance - Comments: keeps trunk flexed Standing balance support: Bilateral upper extremity supported Standing balance-Leahy Scale: Poor Standing balance comment: needs cues to stand up straight and pt unable to maintain trunk extension.  appears fatigued General Comments General comments (skin integrity, edema, etc.): appears deconditioned.  tends to keep eyes closed  End of Session PT - End of Session Equipment Utilized During Treatment: Gait belt Activity Tolerance: Patient limited by fatigue;Patient limited by pain Patient left: with call bell/phone within reach;in chair;with family/visitor present Nurse Communication: Mobility status   GP    Helene Kelp K. Owens Shark, [place] 06/12/2013, 1:35 PM

## 2013-06-12 NOTE — Consult Note (Signed)
ANTIBIOTIC CONSULT NOTE - Follow up  Pharmacy Consult for Vancomycin and Azactam (PCN allergy) Indication: rule out pneumonia  Allergies  Allergen Reactions  . Penicillins Swelling  . Sulfa Antibiotics Other (See Comments)    unknown    Patient Measurements: Height: 5' 1.81" (157 cm) Weight: 215 lb 9.8 oz (97.8 kg) IBW/kg (Calculated) : 54.16  Vital Signs: BP: 99/46 mmHg (03/04 0941) Pulse Rate: 51 (03/04 0941) Intake/Output from previous day: 03/03 0701 - 03/04 0700 In: 960 [P.O.:360; IV Piggyback:600] Out: 350 [Urine:350] Intake/Output from this shift: Total I/O In: 240 [P.O.:240] Out: 1 [Stool:1]  Labs:  Recent Labs  06/10/13 0340 06/11/13 0348 06/12/13 0453  WBC 6.0 6.7 6.7  HGB 7.7* 8.4* 7.4*  PLT 111* PLATELET CLUMPS NOTED ON SMEAR, COUNT APPEARS DECREASED PLATELET CLUMPS NOTED ON SMEAR, COUNT APPEARS DECREASED  CREATININE 2.58* 3.05* 3.77*   Estimated Creatinine Clearance: 12.4 ml/min (by C-G formula based on Cr of 3.77).  Assessment: 89yom s/p recent discharge from the hospital (2/11 to 2/16) after admission for pre-syncope/dehydration returned to the ED with decreased UOP and flank pain. CXR showed progression of left lower lung airspace disease. On empiric antibiotics for possible HCAP. Serum creatinine has risen steadily to 3.05 after receiving 2 doses of vancomycin. WBC wnl, Afebrile.   Aztreonam (PCN allegy) 3/1>> Vanc 3/1>>   3/1 Blood Cx>>   Goal of Therapy:  Vancomycin trough level 15-20 mcg/ml  Plan:  1) Hold Vancomycin. Collect Vanc trough tomorrow  2) Continue Azactam 1g IV q8 3) Follow renal function, cultures, LOT, level as needed   Albertina Parr, PharmD.  Clinical Pharmacist Pager [phone]
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
## 2013-06-12 NOTE — Progress Notes (Addendum)
Document by mistake wrong pt

## 2013-06-12 NOTE — Progress Notes (Signed)
Made MD aware of Renal Ultrasound results. MD has already reviewed results. No new orders. Awaiting follow up with Nephrologist. Will continue to monitor patient for further changes in condition.

## 2013-06-13 DIAGNOSIS — I4891 Unspecified atrial fibrillation: Secondary | ICD-10-CM

## 2013-06-13 DIAGNOSIS — I5032 Chronic diastolic (congestive) heart failure: Secondary | ICD-10-CM

## 2013-06-13 LAB — GLUCOSE, CAPILLARY
GLUCOSE-CAPILLARY: 141 mg/dL — AB (ref 70–99)
GLUCOSE-CAPILLARY: 148 mg/dL — AB (ref 70–99)
GLUCOSE-CAPILLARY: 157 mg/dL — AB (ref 70–99)

## 2013-06-13 LAB — CBC
HCT: 26.7 % — ABNORMAL LOW (ref 39.0–52.0)
HEMOGLOBIN: 9.3 g/dL — AB (ref 13.0–17.0)
MCH: 33 pg (ref 26.0–34.0)
MCHC: 34.8 g/dL (ref 30.0–36.0)
MCV: 94.7 fL (ref 78.0–100.0)
PLATELETS: 168 10*3/uL (ref 150–400)
RBC: 2.82 MIL/uL — AB (ref 4.22–5.81)
RDW: 18.4 % — ABNORMAL HIGH (ref 11.5–15.5)
WBC: 6 10*3/uL (ref 4.0–10.5)

## 2013-06-13 LAB — RENAL FUNCTION PANEL
ALBUMIN: 2 g/dL — AB (ref 3.5–5.2)
BUN: 93 mg/dL — ABNORMAL HIGH (ref 6–23)
CHLORIDE: 102 meq/L (ref 96–112)
CO2: 14 mEq/L — ABNORMAL LOW (ref 19–32)
CREATININE: 4.3 mg/dL — AB (ref 0.50–1.35)
Calcium: 8.8 mg/dL (ref 8.4–10.5)
GFR calc Af Amer: 12 mL/min — ABNORMAL LOW (ref >90)
GFR calc non Af Amer: 11 mL/min — ABNORMAL LOW (ref >90)
Glucose, Bld: 135 mg/dL — ABNORMAL HIGH (ref 70–99)
PHOSPHORUS: 5.9 mg/dL — AB (ref 2.3–4.6)
POTASSIUM: 4.8 meq/L (ref 3.7–5.3)
Sodium: 135 mEq/L — ABNORMAL LOW (ref 137–147)

## 2013-06-13 LAB — CREATININE, URINE, RANDOM: Creatinine, Urine: 261.13 mg/dL

## 2013-06-13 LAB — VANCOMYCIN, TROUGH: VANCOMYCIN TR: 12.4 ug/mL (ref 10.0–20.0)

## 2013-06-13 LAB — SODIUM, URINE, RANDOM: SODIUM UR: 35 meq/L

## 2013-06-13 MED ORDER — RESOURCE THICKENUP CLEAR PO POWD
ORAL | Status: DC | PRN
  Filled 2013-06-13 (×3): qty 125

## 2013-06-13 MED ORDER — VANCOMYCIN HCL IN DEXTROSE 1-5 GM/200ML-% IV SOLN
1000.0000 mg | INTRAVENOUS | Status: DC
  Administered 2013-06-13 – 2013-06-15 (×2): 1000 mg via INTRAVENOUS
  Filled 2013-06-13 (×2): qty 200
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
## 2013-06-13 NOTE — Progress Notes (Signed)
Report given to receiving RN. Patient in bed sleeping. Patient is arousable, with no signs of distress.

## 2013-06-13 NOTE — Progress Notes (Signed)
Assessment:  1. Acute on CKD4 that I suspect is hemodynamically mediated with severe anemia and lowish BP playing a role.  2. Severe anemia 3. DM 4. Possible aspiration PNA/?CHF 5. Malnutrition PLAN:   Will hold flomax with low BP   Subjective: Interval History: s/p PRBCs  Objective: Vital signs in last 24 hours: Temp:  [97.3 F (36.3 C)-98.6 F (37 C)] 97.7 F (36.5 C) (03/05 0609) Pulse Rate:  [36-61] 40 (03/05 1006) Resp:  [19-24] 24 (03/05 0609) BP: (78-113)/(34-50) 100/38 mmHg (03/05 1006) SpO2:  [93 %-100 %] 98 % (03/05 0609) Weight change:   Intake/Output from previous day: 03/04 0701 - 03/05 0700 In: 1460 [P.O.:480; Blood:880; IV Piggyback:100] Out: 501 [Urine:500; Stool:1] Intake/Output this shift: Total I/O In: 240 [P.O.:240] Out: 100 [Urine:100]  General appearance: slowed mentation and HOH Resp: rhonchi bilaterally Chest wall: no tenderness Extremities: edema 1-2+  Lab Results:  Recent Labs  06/11/13 0348 06/12/13 0453  WBC 6.7 6.7  HGB 8.4* 7.4*  HCT 24.5* 20.4*  PLT PLATELET CLUMPS NOTED ON SMEAR, COUNT APPEARS DECREASED PLATELET CLUMPS NOTED ON SMEAR, COUNT APPEARS DECREASED   BMET:  Recent Labs  06/12/13 0453 06/13/13 0815  NA 139 135*  K 4.6 4.8  CL 106 102  CO2 14* 14*  GLUCOSE 146* 135*  BUN 83* 93*  CREATININE 3.77* 4.30*  CALCIUM 8.4 8.8   No results found for this basename: PTH,  in the last 72 hours Iron Studies: No results found for this basename: IRON, TIBC, TRANSFERRIN, FERRITIN,  in the last 72 hours Studies/Results: US Renal  06/12/2013   CLINICAL DATA:  Worsening renal function. Evaluate for potential obstruction.  EXAM: RENAL/URINARY TRACT ULTRASOUND COMPLETE  COMPARISON:  Renal ultrasound 09/22/2012.  FINDINGS: Right Kidney:  Length: 12.4 cm. Echogenicity within normal limits. Mild cortical thinning with expansion of the central sinus echo complex. No mass or hydronephrosis visualized.  Left Kidney:  Length: 11.4 cm.  Echogenicity within normal limits. Mild cortical thinning with expansion of the central sinus echo complex. No mass or hydronephrosis visualized.  Bladder:  Appears normal for degree of bladder distention.  Other Findings:  Bilateral pleural effusions.  IMPRESSION: 1. No hydronephrosis. 2. Mild cortical thinning in the kidneys bilaterally is compatible with the patient's advanced age.   Electronically Signed   By: Vinnie Langton M.D.   On: 06/12/2013 12:21   Scheduled: . aspirin EC  81 mg Oral Daily  . aztreonam  1 g Intravenous 3 times per day  . enoxaparin (LOVENOX) injection  30 mg Subcutaneous Q24H  . insulin aspart  0-9 Units Subcutaneous TID WC  . tamsulosin  0.4 mg Oral QPC breakfast    LOS: 4 days   Corynne Scibilia C 06/13/2013,11:59 AM

## 2013-06-13 NOTE — Telephone Encounter (Signed)
Pt's daughter is aware 

## 2013-06-13 NOTE — Consult Note (Signed)
ANTIBIOTIC CONSULT NOTE - Follow up  Pharmacy Consult for Vancomycin and Azactam (PCN allergy) Indication: rule out pneumonia  Allergies  Allergen Reactions  . Penicillins Swelling  . Sulfa Antibiotics Other (See Comments)    unknown    Patient Measurements: Height: 5' 1.81" (157 cm) Weight: 215 lb 9.8 oz (97.8 kg) IBW/kg (Calculated) : 54.16  Vital Signs: Temp: 98.4 F (36.9 C) (03/05 2035) Temp src: Oral (03/05 2035) BP: 107/50 mmHg (03/05 2035) Pulse Rate: 40 (03/05 2035) Intake/Output from previous day: 03/04 0701 - 03/05 0700 In: 1460 [P.O.:480; Blood:880; IV Piggyback:100] Out: 501 [Urine:500; Stool:1] Intake/Output from this shift:    Labs:  Recent Labs  06/11/13 0348 06/12/13 0453 06/13/13 0633 06/13/13 0815 06/13/13 2007  WBC 6.7 6.7  --   --  6.0  HGB 8.4* 7.4*  --   --  9.3*  PLT PLATELET CLUMPS NOTED ON SMEAR, COUNT APPEARS DECREASED PLATELET CLUMPS NOTED ON SMEAR, COUNT APPEARS DECREASED  --   --  168  LABCREA  --   --  261.13  --   --   CREATININE 3.05* 3.77*  --  4.30*  --    Estimated Creatinine Clearance: 10.9 ml/min (by C-G formula based on Cr of 4.3).  Assessment: 89yom s/p recent discharge from the hospital (2/11 to 2/16) after admission for pre-syncope/dehydration returned to the ED with decreased UOP and flank pain. CXR showed progression of left lower lung airspace disease. On empiric antibiotics for possible HCAP. Serum creatinine has risen steadily to4.3 after receiving 2 doses of vancomycin. WBC wnl, Afebrile.  We held the vancomycin dose this evening and checked a vancomycin trough (not at steady state yet) due to the rise in SCr.  The Vancomycin trough is 12.4 however it was drawn 3 hours later than true trough due. Last vancomycin dose was given 06/11/13 @ 17:39. Vanc Level is <15 mcg/ml. I will resume previous dosing of vancomycin 1000 mg IV q48hr. .   Aztreonam (PCN allegy) 3/1>> Vanc 3/1>>   3/1 Blood Cx>>   Goal of Therapy:   Vancomycin trough level 15-20 mcg/ml  Plan:  1) Resume Vancomycin 1000 mg IV q48h 2) Continue Azactam 1g IV q8H  3) Follow renal function, cultures, LOT, level as needed   Nicole Cella, RPh Clinical Pharmacist Pager: [phone] 06/13/2013 9:39 PM

## 2013-06-13 NOTE — Progress Notes (Signed)
Subjective:  Loletha Grayer yesterday. HR upper 30's at times. No syncope.    Objective:  Vital Signs in the last 24 hours: Temp:  [97.3 F (36.3 C)-98.6 F (37 C)] 97.7 F (36.5 C) (03/05 0609) Pulse Rate:  [36-61] 40 (03/05 1006) Resp:  [19-24] 24 (03/05 0609) BP: (78-113)/(34-50) 100/38 mmHg (03/05 1006) SpO2:  [93 %-100 %] 98 % (03/05 0609)  Intake/Output from previous day: 03/04 0701 - 03/05 0700 In: 1460 [P.O.:480; Blood:880; IV Piggyback:100] Out: 501 [Urine:500; Stool:1]   Physical Exam: General: Elderly, in no acute distress. Head:  Normocephalic and atraumatic. Lungs: Clear to auscultation and percussion. Heart: Normal S1 and S2.  No murmur, rubs or gallops.  Abdomen: soft, non-tender, positive bowel sounds. Protuberant Extremities: No clubbing or cyanosis. No edema. Neurologic: Alert    Lab Results:  Recent Labs  06/11/13 0348 06/12/13 0453  WBC 6.7 6.7  HGB 8.4* 7.4*  PLT PLATELET CLUMPS NOTED ON SMEAR, COUNT APPEARS DECREASED PLATELET CLUMPS NOTED ON SMEAR, COUNT APPEARS DECREASED    Recent Labs  06/12/13 0453 06/13/13 0815  NA 139 135*  K 4.6 4.8  CL 106 102  CO2 14* 14*  GLUCOSE 146* 135*  BUN 83* 93*  CREATININE 3.77* 4.30*    Hepatic Function Panel  Recent Labs  06/13/13 0815  ALBUMIN 2.0*   Imaging: Dg Chest 2 View  06/11/2013   CLINICAL DATA:  Short of breath.  Cough.  EXAM: CHEST  2 VIEW  COMPARISON:  06/09/2013  FINDINGS: Coarse interstitial and airspace opacities in the lungs are stable. Lung volumes are relatively low. Cardiac silhouette is mildly enlarged. No pneumothorax or convincing effusion.  IMPRESSION: No change from the most recent prior study. Persistent bilateral coarse interstitial and airspace opacities suggests multifocal pneumonia.   Electronically Signed   By: Lajean Manes M.D.   On: 06/11/2013 13:34   US Renal  06/12/2013   CLINICAL DATA:  Worsening renal function. Evaluate for potential obstruction.  EXAM:  RENAL/URINARY TRACT ULTRASOUND COMPLETE  COMPARISON:  Renal ultrasound 09/22/2012.  FINDINGS: Right Kidney:  Length: 12.4 cm. Echogenicity within normal limits. Mild cortical thinning with expansion of the central sinus echo complex. No mass or hydronephrosis visualized.  Left Kidney:  Length: 11.4 cm. Echogenicity within normal limits. Mild cortical thinning with expansion of the central sinus echo complex. No mass or hydronephrosis visualized.  Bladder:  Appears normal for degree of bladder distention.  Other Findings:  Bilateral pleural effusions.  IMPRESSION: 1. No hydronephrosis. 2. Mild cortical thinning in the kidneys bilaterally is compatible with the patient's advanced age.   Electronically Signed   By: Vinnie Langton M.D.   On: 06/12/2013 12:21   Personally viewed.   Telemetry: Loletha Grayer as above. Aflutter slow response Personally viewed.   EKG:  SR first degree AVB on 06/09/13.  Cardiac Studies:   2D Echocardiogram 3.3.2015  Study Conclusions  - Left ventricle: There was mild concentric hypertrophy. Systolic function was normal. The estimated ejection fraction was in the range of 50% to 55%. Features are consistent with a pseudonormal left ventricular filling pattern, with concomitant abnormal relaxation and increased filling pressure (grade 2 diastolic dysfunction). - Aortic valve: Severe thickening and calcification. There was mild stenosis. Mild regurgitation. Valve area: 1.31cm^2(VTI). Valve area: 1.27cm^2 (Vmax). - Mitral valve: Severely calcified annulus. Mildly thickened leaflets . Valve area by continuity equation (using LVOT flow): 1.26cm^2. - Left atrium: The atrium was mildly dilated. - Pulmonary arteries: PA peak pressure: 56mm Hg (S).  Assessment/Plan:  Principal Problem:   Acute and chronic respiratory failure Active Problems:   Hypertension   Coronary artery disease   Hyperlipidemia   Acute on chronic renal failure   CKD (chronic kidney disease), stage  IV   Hypoxia   Acute on chronic combined systolic and diastolic congestive heart failure   Atrial flutter   Community acquired pneumonia   80 yr old gentleman with sig hx of PAD, HTN, combined systolic and Diastolic CHF, EF 80-99% (11/12/3823), atrial flutter, CKD stage 3-4 at baseline and DM type 2; arrived to [facility] with complaints of weakness and SOB.  1) Bradycardia  - continue to monitor.  - no pacemaker. No syncope.   - Discussed with Dr. Laurann Montana.   - would not proceed with aggressive intervention.   2) Chronic diastolic HF  - monitor after getting 2 units PRBC  - appears the same as before in regards to resp status, stable.   3) Acute renal failure  - Dr. Mercy Moore note reviewed. Worsening.   - Trying to increase volume first.   - If becomes counterproductive, could completely switch gears and try diuresis.   - Renal US reassuring  - Poor prognosis.   4) CAD  - stable  5) Anemia  - PRBC x 2  6) AFib - labeled on tele. I do not see a clear P wave. Occasional P wave may be present.  - associated brady  - not anticoag candidate.  Daughter understands poor overall prognosis. No dialysis.   At this point will sign off. Please call if further assistance is needed.   Sha Amer, [place] 06/13/2013, 10:53 AM

## 2013-06-13 NOTE — Progress Notes (Signed)
MD Griffin's and MD Mattingly's office has been informed and message has been left to call daughter Maudie Mercury at [phone] for an update.

## 2013-06-13 NOTE — Progress Notes (Signed)
Subjective: No complaints.  Objective: Vital signs in last 24 hours: Temp:  [97.3 F (36.3 C)-98.6 F (37 C)] 97.7 F (36.5 C) (03/05 0609) Pulse Rate:  [36-61] 40 (03/05 0609) Resp:  [19-24] 24 (03/05 0609) BP: (78-113)/(34-50) 113/46 mmHg (03/05 0609) SpO2:  [93 %-100 %] 98 % (03/05 0609) Weight change:  Last BM Date: 06/09/13 (pt unsure if that is his las BM)  Intake/Output from previous day: 03/04 0701 - 03/05 0700 In: 1460 [P.O.:480; Blood:880; IV Piggyback:100] Out: 501 [Urine:500; Stool:1] Intake/Output this shift:    General appearance: alert Resp: rales bilaterally Cardio: bradycardic Extremities: extremities normal, atraumatic, no cyanosis or edema  Lab Results:  Recent Labs  06/11/13 0348 06/12/13 0453  WBC 6.7 6.7  HGB 8.4* 7.4*  HCT 24.5* 20.4*  PLT PLATELET CLUMPS NOTED ON SMEAR, COUNT APPEARS DECREASED PLATELET CLUMPS NOTED ON SMEAR, COUNT APPEARS DECREASED   BMET  Recent Labs  06/11/13 0348 06/12/13 0453  NA 137 139  K 4.4 4.6  CL 105 106  CO2 15* 14*  GLUCOSE 164* 146*  BUN 65* 83*  CREATININE 3.05* 3.77*  CALCIUM 8.2* 8.4    Studies/Results: Dg Chest 2 View  06/11/2013   CLINICAL DATA:  Short of breath.  Cough.  EXAM: CHEST  2 VIEW  COMPARISON:  06/09/2013  FINDINGS: Coarse interstitial and airspace opacities in the lungs are stable. Lung volumes are relatively low. Cardiac silhouette is mildly enlarged. No pneumothorax or convincing effusion.  IMPRESSION: No change from the most recent prior study. Persistent bilateral coarse interstitial and airspace opacities suggests multifocal pneumonia.   Electronically Signed   By: Lajean Manes M.D.   On: 06/11/2013 13:34   US Renal  06/12/2013   CLINICAL DATA:  Worsening renal function. Evaluate for potential obstruction.  EXAM: RENAL/URINARY TRACT ULTRASOUND COMPLETE  COMPARISON:  Renal ultrasound 09/22/2012.  FINDINGS: Right Kidney:  Length: 12.4 cm. Echogenicity within normal limits. Mild  cortical thinning with expansion of the central sinus echo complex. No mass or hydronephrosis visualized.  Left Kidney:  Length: 11.4 cm. Echogenicity within normal limits. Mild cortical thinning with expansion of the central sinus echo complex. No mass or hydronephrosis visualized.  Bladder:  Appears normal for degree of bladder distention.  Other Findings:  Bilateral pleural effusions.  IMPRESSION: 1. No hydronephrosis. 2. Mild cortical thinning in the kidneys bilaterally is compatible with the patient's advanced age.   Electronically Signed   By: Vinnie Langton M.D.   On: 06/12/2013 12:21    Medications: I have reviewed the patient's current medications.  Assessment/Plan: 1. Acute on chronic Renal Failure, oliguric, appreciate renal input.  Given transfusion 2U PRBCs last night, creatinine later this am.  Felt to be dry by renal.  Prognosis poor if renal function does not begin to improve.  U/S no obstruction.  Not a dialysis candidate 2. Pneumonia, possibly aspiration, antibiotics day 5.  Speech therapy consult to r/o aspiration 3. Chronic Combined systolic and Diastolic CHF stable, NoACEI given renal function and no beta blocker given bradycardia(atrial flutter with slow ventricular response) .Echo shows near normal EF with grade II diastolic dysfunction  4. Anemia of chronic disease follow,transfusion yesterday 5. DM ssi controlled  6. HTN BP low, fluid bolus last night 7. No Code Blue. Consider palliative consult if renal function continues to decline    LOS: 4 days   [name] JOSEPH 06/13/2013, 7:20 AM

## 2013-06-13 NOTE — Progress Notes (Signed)
Spoken to lab. Lab will be up to draw vanc trough.

## 2013-06-13 NOTE — Progress Notes (Signed)
2nd unit of PRBC given as ordered no adverse reaction noticed.

## 2013-06-14 ENCOUNTER — Inpatient Hospital Stay (HOSPITAL_COMMUNITY): Payer: Medicare HMO

## 2013-06-14 LAB — GLUCOSE, CAPILLARY
Glucose-Capillary: 146 mg/dL — ABNORMAL HIGH (ref 70–99)
Glucose-Capillary: 186 mg/dL — ABNORMAL HIGH (ref 70–99)
Glucose-Capillary: 169 mg/dL — ABNORMAL HIGH (ref 70–99)
Glucose-Capillary: 153 mg/dL — ABNORMAL HIGH (ref 70–99)

## 2013-06-14 LAB — TYPE AND SCREEN
ABO/RH(D): A POS
Antibody Screen: NEGATIVE
UNIT DIVISION: 0
Unit division: 0

## 2013-06-14 LAB — CBC
HCT: 27.4 % — ABNORMAL LOW (ref 39.0–52.0)
HEMOGLOBIN: 9.3 g/dL — AB (ref 13.0–17.0)
MCH: 32.3 pg (ref 26.0–34.0)
MCHC: 33.9 g/dL (ref 30.0–36.0)
MCV: 95.1 fL (ref 78.0–100.0)
PLATELETS: 163 10*3/uL (ref 150–400)
RBC: 2.88 MIL/uL — AB (ref 4.22–5.81)
RDW: 18.2 % — ABNORMAL HIGH (ref 11.5–15.5)
WBC: 6.3 10*3/uL (ref 4.0–10.5)

## 2013-06-14 LAB — RENAL FUNCTION PANEL
Albumin: 1.8 g/dL — ABNORMAL LOW (ref 3.5–5.2)
BUN: 105 mg/dL — ABNORMAL HIGH (ref 6–23)
CALCIUM: 8.6 mg/dL (ref 8.4–10.5)
CO2: 13 meq/L — AB (ref 19–32)
Chloride: 102 mEq/L (ref 96–112)
Creatinine, Ser: 4.17 mg/dL — ABNORMAL HIGH (ref 0.50–1.35)
GFR calc non Af Amer: 11 mL/min — ABNORMAL LOW (ref >90)
GFR, EST AFRICAN AMERICAN: 13 mL/min — AB (ref >90)
Glucose, Bld: 178 mg/dL — ABNORMAL HIGH (ref 70–99)
PHOSPHORUS: 6.2 mg/dL — AB (ref 2.3–4.6)
POTASSIUM: 4.8 meq/L (ref 3.7–5.3)
SODIUM: 134 meq/L — AB (ref 137–147)

## 2013-06-14 MED ORDER — FUROSEMIDE 10 MG/ML IJ SOLN
100.0000 mg | Freq: Once | INTRAVENOUS | Status: DC
  Filled 2013-06-14: qty 10

## 2013-06-14 MED ORDER — FUROSEMIDE 10 MG/ML IJ SOLN
100.0000 mg | Freq: Two times a day (BID) | INTRAVENOUS | Status: AC
  Administered 2013-06-14 (×2): 100 mg via INTRAVENOUS
  Filled 2013-06-14 (×2): qty 10

## 2013-06-14 NOTE — Progress Notes (Signed)
PT Cancellation Note  Patient Details Name: [name] MRN: [id number] DOB: 1917/08/19   Cancelled Treatment:    Reason Eval/Treat Not Completed: Patient at procedure or test/unavailable   [name] 06/14/2013, 9:55 AM Elwyn Reach, [place]

## 2013-06-14 NOTE — Progress Notes (Signed)
CSW spoke to patient's daughter Maudie Mercury and Daugher-in-law Jonelle Sidle today to discuss d/c options for possible SNF.  Patient has [employer] and has recently been a resident at [facility] where he completed his 20 days of (fully covered) [employer] days-  He is now in in "co-insurance" days (days 21-100) which cost $156.00 per day. Family is aware of this and have elected to bring patient back home at d/c. He has been saying with his son and daughter-in-law; they are looking at hiring private duty care.  Per family- patient has all necessary DME in place.  Discussed possible Medicaid application-however daughter states that he will no qualify due to rental properties and other assets.  CSW spoke to [place] and notified her of above.  CSW will sign off for now but will be available to assist as needed.  Lorie Phenix. [place], [place]
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
## 2013-06-14 NOTE — Progress Notes (Signed)
Subjective: No complaints  Objective: Vital signs in last 24 hours: Temp:  [98.4 F (36.9 C)-98.7 F (37.1 C)] 98.5 F (36.9 C) (03/06 0615) Pulse Rate:  [40-49] 49 (03/06 0615) Resp:  [18-20] 18 (03/06 0615) BP: (100-107)/(38-50) 101/45 mmHg (03/06 0615) SpO2:  [95 %-98 %] 95 % (03/06 0615) Weight:  [100.381 kg (221 lb 4.8 oz)] 100.381 kg (221 lb 4.8 oz) (03/06 0440) Weight change:  Last BM Date: 06/14/13  Intake/Output from previous day: 03/05 0701 - 03/06 0700 In: 720 [P.O.:720] Out: 525 [Urine:525] Intake/Output this shift:    General appearance: alert Resp: wheezes bilaterally Cardio: bradycardic, regular Extremities: extremities normal, atraumatic, no cyanosis or edema  Lab Results:  Recent Labs  06/13/13 2007 06/14/13 0416  WBC 6.0 6.3  HGB 9.3* 9.3*  HCT 26.7* 27.4*  PLT 168 163   BMET  Recent Labs  06/13/13 0815 06/14/13 0406  NA 135* 134*  K 4.8 4.8  CL 102 102  CO2 14* 13*  GLUCOSE 135* 178*  BUN 93* 105*  CREATININE 4.30* 4.17*  CALCIUM 8.8 8.6    Studies/Results: US Renal  06/12/2013   CLINICAL DATA:  Worsening renal function. Evaluate for potential obstruction.  EXAM: RENAL/URINARY TRACT ULTRASOUND COMPLETE  COMPARISON:  Renal ultrasound 09/22/2012.  FINDINGS: Right Kidney:  Length: 12.4 cm. Echogenicity within normal limits. Mild cortical thinning with expansion of the central sinus echo complex. No mass or hydronephrosis visualized.  Left Kidney:  Length: 11.4 cm. Echogenicity within normal limits. Mild cortical thinning with expansion of the central sinus echo complex. No mass or hydronephrosis visualized.  Bladder:  Appears normal for degree of bladder distention.  Other Findings:  Bilateral pleural effusions.  IMPRESSION: 1. No hydronephrosis. 2. Mild cortical thinning in the kidneys bilaterally is compatible with the patient's advanced age.   Electronically Signed   By: Vinnie Langton M.D.   On: 06/12/2013 12:21    Medications: I have  reviewed the patient's current medications.  Assessment/Plan: 1. Acute on chronic Renal Failure, appreciate renal input. Given transfusion 2U PRBCs Wed., creatinine stable,  Has some wheezing on exam, Check cxr, consider diuresis?, would appreciate renal input 2. Pneumonia, possibly aspiration, antibiotics day 6. MBS today to evaluate swallow function 3. Chronic Combined systolic and Diastolic CHF stable, NoACEI given renal function and no beta blocker given bradycardia(atrial flutter with slow ventricular response) .Echo shows near normal EF with grade II diastolic dysfunction.  Some wheezing today on exam, check CXR 4. Anemia of chronic disease follow transfusion 3/4 5. DM ssi controlled  6. HTN BP low, stable 7. No Code Blue. Consider palliative consult if renal function continues to decline.  May need NHP, discussed with daughter, ask SW to see   LOS: 5 days   [name] JOSEPH 06/14/2013, 7:59 AM

## 2013-06-14 NOTE — Progress Notes (Signed)
Patient alert and oriented x1 throughout shift.  Family and aide at bedside intermittently throughout day.  Denies any concerns at this time.  Auscultated expiratory wheezes in patient's lungs this AM, PRN respiratory treatment administered.  Audible wheezing greatly diminished post-treatment.  Administered IV Lasix twice during shift as ordered by Nephrology.  Will continue to monitor.
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
## 2013-06-15 LAB — RENAL FUNCTION PANEL
Albumin: 1.9 g/dL — ABNORMAL LOW (ref 3.5–5.2)
BUN: 113 mg/dL — ABNORMAL HIGH (ref 6–23)
CO2: 13 meq/L — AB (ref 19–32)
Calcium: 8.8 mg/dL (ref 8.4–10.5)
Chloride: 102 mEq/L (ref 96–112)
Creatinine, Ser: 4.16 mg/dL — ABNORMAL HIGH (ref 0.50–1.35)
GFR calc non Af Amer: 11 mL/min — ABNORMAL LOW (ref >90)
GFR, EST AFRICAN AMERICAN: 13 mL/min — AB (ref >90)
Glucose, Bld: 160 mg/dL — ABNORMAL HIGH (ref 70–99)
Phosphorus: 7.1 mg/dL — ABNORMAL HIGH (ref 2.3–4.6)
Potassium: 5.1 mEq/L (ref 3.7–5.3)
SODIUM: 135 meq/L — AB (ref 137–147)

## 2013-06-15 LAB — GLUCOSE, CAPILLARY
GLUCOSE-CAPILLARY: 145 mg/dL — AB (ref 70–99)
GLUCOSE-CAPILLARY: 160 mg/dL — AB (ref 70–99)
GLUCOSE-CAPILLARY: 158 mg/dL — AB (ref 70–99)
Glucose-Capillary: 150 mg/dL — ABNORMAL HIGH (ref 70–99)

## 2013-06-15 MED ORDER — FUROSEMIDE 10 MG/ML IJ SOLN
100.0000 mg | Freq: Two times a day (BID) | INTRAVENOUS | Status: DC
  Administered 2013-06-15 – 2013-06-18 (×7): 100 mg via INTRAVENOUS
  Filled 2013-06-15 (×8): qty 10

## 2013-06-15 MED ORDER — SODIUM BICARBONATE 650 MG PO TABS
1300.0000 mg | ORAL_TABLET | Freq: Two times a day (BID) | ORAL | Status: DC
  Administered 2013-06-15 – 2013-06-19 (×9): 1300 mg via ORAL
  Filled 2013-06-15 (×10): qty 2
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
## 2013-06-15 NOTE — Progress Notes (Signed)
Admit: 06/09/2013 LOS: 6  61M w/ AoCKD in setting of likely aspiration PNA, CHF, Anemia  Subjective:  Palliative care consult given today Rec IV BID lasix yesterday SCr relatively stable BUN somewhat inc  Hb improved Pt confused On Vanc/Aztreonam  03/06 0701 - 03/07 0700 In: 720 [P.O.:720] Out: 702 [Urine:700; Stool:2]  Filed Weights   06/11/13 0519 06/14/13 0440 06/15/13 0525  Weight: 97.8 kg (215 lb 9.8 oz) 100.381 kg (221 lb 4.8 oz) 100.562 kg (221 lb 11.2 oz)    Current meds: reviewed  Current Labs: reviewed    Physical Exam:  Blood pressure 133/44, pulse 42, temperature 97.3 F (36.3 C), temperature source Oral, resp. rate 20, height 5' 1.81" (1.57 m), weight 100.562 kg (221 lb 11.2 oz), SpO2 94.00%. Chroincally ill appearing, confused RRR B/l exp wheezing present No LEE No rashes/lesions Not AAO, not participartory in exam  Assessment 1. AoCKD (BL 1.8-1.9).  ? Cardiorenal or ATN 2. PNA on Vanc/Aztreonam 3. Anemia, stable s/p transfusion 4. CHF 5. Malnutrition 6. Acidosis  Plan 1. Agree with assistance of palliativecare 2. Will cont lasix today 3. Follow renal panel daily 4. Start NaHCO3 1300 BID 5. Will cont to follow along.    Brent Grippe MD 06/15/2013, 11:19 AM   Recent Labs Lab 06/13/13 0815 06/14/13 0406 06/15/13 0525  NA 135* 134* 135*  K 4.8 4.8 5.1  CL 102 102 102  CO2 14* 13* 13*  GLUCOSE 135* 178* 160*  BUN 93* 105* 113*  CREATININE 4.30* 4.17* 4.16*  CALCIUM 8.8 8.6 8.8  PHOS 5.9* 6.2* 7.1*    Recent Labs Lab 06/09/13 1432  06/12/13 0453 06/13/13 2007 06/14/13 0416  WBC 7.1  < > 6.7 6.0 6.3  NEUTROABS 5.4  --   --   --   --   HGB 8.5*  < > 7.4* 9.3* 9.3*  HCT 24.9*  < > 20.4* 26.7* 27.4*  MCV 100.0  < > 97.6 94.7 95.1  PLT 126*  < > PLATELET CLUMPS NOTED ON SMEAR, COUNT APPEARS DECREASED 168 163  < > = values in this interval not displayed.

## 2013-06-15 NOTE — Progress Notes (Signed)
Patient [name]      DOB: 1917/10/04      PJK:[id number]  Daughter states she and her brother are unavailable until Monday AM for phone goals.  Offered Phone goals this weekend , she declined. Scheduled Phone goals of care for Monday at 930 am.   Jarrett Chicoine L. Lovena Le, MD MBA The Palliative Medicine Team at [facility] Phone: [phone] Pager: [phone]

## 2013-06-16 LAB — CULTURE, BLOOD (ROUTINE X 2)
CULTURE: NO GROWTH
Culture: NO GROWTH

## 2013-06-16 LAB — CBC
HCT: 28.7 % — ABNORMAL LOW (ref 39.0–52.0)
Hemoglobin: 9.7 g/dL — ABNORMAL LOW (ref 13.0–17.0)
MCH: 32.9 pg (ref 26.0–34.0)
MCHC: 33.8 g/dL (ref 30.0–36.0)
MCV: 97.3 fL (ref 78.0–100.0)
Platelets: 222 10*3/uL (ref 150–400)
RBC: 2.95 MIL/uL — AB (ref 4.22–5.81)
RDW: 17.7 % — AB (ref 11.5–15.5)
WBC: 7.2 10*3/uL (ref 4.0–10.5)

## 2013-06-16 LAB — BASIC METABOLIC PANEL
BUN: 120 mg/dL — ABNORMAL HIGH (ref 6–23)
CALCIUM: 8.9 mg/dL (ref 8.4–10.5)
CHLORIDE: 100 meq/L (ref 96–112)
CO2: 15 meq/L — AB (ref 19–32)
CREATININE: 3.91 mg/dL — AB (ref 0.50–1.35)
GFR calc Af Amer: 14 mL/min — ABNORMAL LOW (ref >90)
GFR calc non Af Amer: 12 mL/min — ABNORMAL LOW (ref >90)
Glucose, Bld: 142 mg/dL — ABNORMAL HIGH (ref 70–99)
Potassium: 4.7 mEq/L (ref 3.7–5.3)
SODIUM: 134 meq/L — AB (ref 137–147)

## 2013-06-16 LAB — GLUCOSE, CAPILLARY
GLUCOSE-CAPILLARY: 187 mg/dL — AB (ref 70–99)
Glucose-Capillary: 147 mg/dL — ABNORMAL HIGH (ref 70–99)
Glucose-Capillary: 167 mg/dL — ABNORMAL HIGH (ref 70–99)
Glucose-Capillary: 139 mg/dL — ABNORMAL HIGH (ref 70–99)

## 2013-06-16 MED ORDER — ALBUTEROL SULFATE (2.5 MG/3ML) 0.083% IN NEBU
2.5000 mg | INHALATION_SOLUTION | Freq: Three times a day (TID) | RESPIRATORY_TRACT | Status: DC
  Administered 2013-06-16 – 2013-06-17 (×4): 2.5 mg via RESPIRATORY_TRACT
  Filled 2013-06-16 (×4): qty 3

## 2013-06-16 MED ORDER — LEVOFLOXACIN 500 MG PO TABS
500.0000 mg | ORAL_TABLET | ORAL | Status: DC
  Administered 2013-06-16 – 2013-06-18 (×2): 500 mg via ORAL
  Filled 2013-06-16 (×3): qty 1

## 2013-06-16 MED ORDER — LEVOFLOXACIN 250 MG PO TABS
250.0000 mg | ORAL_TABLET | Freq: Once | ORAL | Status: AC
  Administered 2013-06-16: 250 mg via ORAL
  Filled 2013-06-16: qty 1
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
## 2013-06-16 NOTE — Progress Notes (Signed)
Admit: 06/09/2013 LOS: 7  24M w/ AoCKD in setting of likely aspiration PNA, CHF, Anemia  Subjective:  Pt somewhat more clear this AM On BID lasix Palliative care unable to meet with family tot his point Pt w/ foot pain  03/07 0701 - 03/08 0700 In: 790 [P.O.:440; IV Piggyback:350] Out: 950 [Urine:950]  Filed Weights   06/14/13 0440 06/15/13 0525 06/16/13 0511  Weight: 100.381 kg (221 lb 4.8 oz) 100.562 kg (221 lb 11.2 oz) 101.651 kg (224 lb 1.6 oz)    Current meds: reviewed  Current Labs: reviewed    Physical Exam:  Blood pressure 131/45, pulse 40, temperature 97.7 F (36.5 C), temperature source Oral, resp. rate 28, height 5' 1.81" (1.57 m), weight 101.651 kg (224 lb 1.6 oz), SpO2 95.00%. Chroincally ill appearing, confused RRR B/l exp wheezing present No LEE No rashes/lesions Not AAO, not participartory in exam  Assessment 1. AoCKD (BL 1.8-1.9).  ? Cardiorenal or ATN related to PNA; stable with adequat UOP 2. PNA on Vanc/Aztreonam 3. Anemia, stable s/p transfusion 4. CHF 5. Malnutrition 6. Acidosis, improving on NaHCO3  Plan 1. Agree with assistance of palliative care 2. Will cont lasix today 3. Follow renal panel daily 4. Cont NaHCO3 1300 BID 5. Will cont to follow along.    [name] MD 06/16/2013, 11:26 AM   Recent Labs Lab 06/13/13 0815 06/14/13 0406 06/15/13 0525 06/16/13 0445  NA 135* 134* 135* 134*  K 4.8 4.8 5.1 4.7  CL 102 102 102 100  CO2 14* 13* 13* 15*  GLUCOSE 135* 178* 160* 142*  BUN 93* 105* 113* 120*  CREATININE 4.30* 4.17* 4.16* 3.91*  CALCIUM 8.8 8.6 8.8 8.9  PHOS 5.9* 6.2* 7.1*  --     Recent Labs Lab 06/09/13 1432  06/13/13 2007 06/14/13 0416 06/16/13 0445  WBC 7.1  < > 6.0 6.3 7.2  NEUTROABS 5.4  --   --   --   --   HGB 8.5*  < > 9.3* 9.3* 9.7*  HCT 24.9*  < > 26.7* 27.4* 28.7*  MCV 100.0  < > 94.7 95.1 97.3  PLT 126*  < > 168 163 222  < > = values in this interval not displayed.

## 2013-06-17 DIAGNOSIS — Z66 Do not resuscitate: Secondary | ICD-10-CM

## 2013-06-17 DIAGNOSIS — Z515 Encounter for palliative care: Secondary | ICD-10-CM

## 2013-06-17 LAB — GLUCOSE, CAPILLARY
Glucose-Capillary: 150 mg/dL — ABNORMAL HIGH (ref 70–99)
Glucose-Capillary: 159 mg/dL — ABNORMAL HIGH (ref 70–99)
Glucose-Capillary: 154 mg/dL — ABNORMAL HIGH (ref 70–99)
Glucose-Capillary: 142 mg/dL — ABNORMAL HIGH (ref 70–99)

## 2013-06-17 MED ORDER — BISACODYL 10 MG RE SUPP: 10.0000 mg | Freq: Every day | RECTAL | Status: DC | PRN

## 2013-06-17 MED ORDER — ALBUTEROL SULFATE (2.5 MG/3ML) 0.083% IN NEBU: 2.5000 mg | INHALATION_SOLUTION | RESPIRATORY_TRACT | Status: DC | PRN

## 2013-06-17 NOTE — Progress Notes (Signed)
Physical Therapy Treatment Patient Details Name: [name] MRN: [id number] DOB: 1917-09-16 Today's Date: 06/17/2013 Time: 4098-1191 PT Time Calculation (min): 18 min  PT Assessment / Plan / Recommendation  History of Present Illness pt to have renal consult   PT Comments   Pt admitted with above. Pt currently with functional limitations due to balance and endurance deficits.  Pt will benefit from skilled PT to increase their independence and safety with mobility to allow discharge to the venue listed below.   Follow Up Recommendations  SNF;Supervision/Assistance - 24 hour     Does the patient have the potential to tolerate intense rehabilitation     Barriers to Discharge        Equipment Recommendations  None recommended by PT    Recommendations for Other Services    Frequency Min 3X/week   Progress towards PT Goals Progress towards PT goals: Progressing toward goals  Plan Current plan remains appropriate    Precautions / Restrictions Precautions Precautions: Fall Restrictions Weight Bearing Restrictions: No   Pertinent Vitals/Pain VSS, No pain    Mobility  Bed Mobility Overal bed mobility: Needs Assistance Bed Mobility: Supine to Sit Supine to sit: Mod assist General bed mobility comments: needs assist to raise trunk to sitting Transfers Overall transfer level: Needs assistance Equipment used: 1 person hand held assist Transfers: Sit to/from Stand;Stand Pivot Transfers Sit to Stand: Mod assist Stand pivot transfers: Mod assist General transfer comment: pt needs extra time and consistent cues for hand placement to push up and also to bring chest up into standing upright. pt tends to loudly grunt with exertional effort.Pt with posterior lean.    Exercises General Exercises - Lower Extremity Ankle Circles/Pumps: AAROM;Both;5 reps;Supine;Seated   PT Diagnosis:    PT Problem List:   PT Treatment Interventions:     PT Goals (current goals can now be found in  the care plan section)    Visit Information  Last PT Received On: 06/17/13 Assistance Needed: +2 History of Present Illness: pt to have renal consult    Subjective Data  Subjective: "I want to get up if I can. "   Cognition  Cognition Arousal/Alertness: Awake/alert Behavior During Therapy: Flat affect Overall Cognitive Status: No family/caregiver present to determine baseline cognitive functioning Area of Impairment: Orientation;Memory;Following commands;Safety/judgement Orientation Level: Disoriented to;Time;Situation Memory: Decreased short-term memory Following Commands: Follows one step commands with increased time Safety/Judgement: Decreased awareness of safety    Balance  Balance Overall balance assessment: Needs assistance Sitting-balance support: Bilateral upper extremity supported;Feet supported Sitting balance-Leahy Scale: Fair Sitting balance - Comments: keeps trunk flexed with posterior lean Postural control: Posterior lean Standing balance support: Bilateral upper extremity supported;During functional activity Standing balance-Leahy Scale: Poor Standing balance comment: Could not stand fully upright with mod asssist.  Transferred wtih mod assist to chair with need for full facilitation at hips to complete transfer.   General Comments General comments (skin integrity, edema, etc.): very deconditioned, coughed up sputum.   End of Session PT - End of Session Equipment Utilized During Treatment: Gait belt Activity Tolerance: Patient limited by fatigue Patient left: with call bell/phone within reach;in chair;with family/visitor present Nurse Communication: Mobility status   GP     INGOLD,Jhamir Pickup 06/17/2013, 4:48 PM [facility] [phone] (pager)

## 2013-06-17 NOTE — Progress Notes (Signed)
Pt alert to self, pt c/o generalized pain, PRN Tylenol given as ordered, pt stable

## 2013-06-17 NOTE — Progress Notes (Signed)
Subjective: No complaints  Objective: Vital signs in last 24 hours: Temp:  [97.4 F (36.3 C)-97.8 F (36.6 C)] 97.4 F (36.3 C) (03/09 0535) Pulse Rate:  [47-54] 54 (03/09 0535) Resp:  [21-22] 21 (03/09 0535) BP: (115-128)/(45-49) 128/45 mmHg (03/09 0535) SpO2:  [95 %-98 %] 96 % (03/09 0535) Weight:  [98.5 kg (217 lb 2.5 oz)] 98.5 kg (217 lb 2.5 oz) (03/09 0535) Weight change: -3.151 kg (-6 lb 15.2 oz) Last BM Date: 06/16/13  Intake/Output from previous day: 03/08 0701 - 03/09 0700 In: 600 [P.O.:480; IV Piggyback:120] Out: 700 [Urine:700] Intake/Output this shift:    Resp: clear to auscultation bilaterally Cardio: regular rate and rhythm, S1, S2 normal, no murmur, click, rub or gallop Extremities: extremities normal, atraumatic, no cyanosis or edema  Lab Results:  Recent Labs  06/16/13 0445  WBC 7.2  HGB 9.7*  HCT 28.7*  PLT 222   BMET  Recent Labs  06/15/13 0525 06/16/13 0445  NA 135* 134*  K 5.1 4.7  CL 102 100  CO2 13* 15*  GLUCOSE 160* 142*  BUN 113* 120*  CREATININE 4.16* 3.91*  CALCIUM 8.8 8.9    Studies/Results: No results found.  Medications: I have reviewed the patient's current medications.  Assessment/Plan: 1. Acute on chronic Renal Failure,labs pending.  Prognosis guarded 2. Pneumonia, possibly aspiration,switched to po levaquin, D3 diet with nectar thick liquids 3. Chronic Combined systolic and Diastolic CHF stable, No ACEI given renal function and no beta blocker given bradycardia(atrial flutter with slow ventricular response) .Echo shows near normal EF with grade II diastolic dysfunction. Cardiology input noted- no pacemaker.  4. Anemia of chronic disease follow transfusion 3/4 - count stable  5. DM ssi controlled  6. HTN BP low, stable  7. No Code Blue.palliative care goals of care meeting today 8. Moderate protein malnutrition- supportive care- supplement   LOS: 8 days   [name] JOSEPH 06/17/2013, 7:47 AM
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
## 2013-06-17 NOTE — Progress Notes (Signed)
Nutrition Brief Note  Patient identified on the Malnutrition Screening Tool (MST) Report  Wt Readings from Last 15 Encounters:  06/17/13 217 lb 2.5 oz (98.5 kg)  05/27/13 199 lb 11.8 oz (90.6 kg)  05/27/13 199 lb 11.8 oz (90.6 kg)  05/14/13 210 lb 6.4 oz (95.437 kg)  05/10/13 168 lb (76.204 kg)  05/03/13 168 lb (76.204 kg)  04/12/13 168 lb 10.4 oz (76.5 kg)  03/01/13 216 lb (97.977 kg)  02/20/13 216 lb (97.977 kg)  01/31/13 216 lb (97.977 kg)  01/31/13 216 lb (97.977 kg)  01/25/13 216 lb (97.977 kg)  12/26/12 211 lb 10.3 oz (96 kg)  12/07/12 223 lb 15.8 oz (101.6 kg)  11/01/12 213 lb 13.5 oz (97 kg)    Body mass index is 39.96 kg/(m^2). Patient meets criteria for Obesity based on current BMI.   Current diet order is Dysphagia 3, nectar-thick liquids, patient is consuming approximately 75-100% of most meals at this time. Pt reports having a good appetite and feels he is eating well. Pt's caretaker at bedside reports pt is eating normally and ate well PTA; maintained his weight without use of nutritional supplements. Labs and medications reviewed.   No nutrition interventions warranted at this time. If nutrition issues arise, please consult RD.   Pryor Ochoa RD, LDN Inpatient Clinical Dietitian Pager: [phone] After Hours Pager: [phone]

## 2013-06-17 NOTE — Progress Notes (Signed)
UR completed [name] K. Kenidee Cregan, RN, BSN, [place], CCM  06/17/2013 3:37 PM

## 2013-06-17 NOTE — Progress Notes (Signed)
S:+ cough.  ? If he can handle his secretions.  Foley cath out O:BP 124/38  Pulse 50  Temp(Src) 98.3 F (36.8 C) (Oral)  Resp 22  Ht 5' 1.81" (1.57 m)  Wt 98.5 kg (217 lb 2.5 oz)  BMI 39.96 kg/m2  SpO2 100%  Intake/Output Summary (Last 24 hours) at 06/17/13 1355 Last data filed at 06/17/13 1241  Gross per 24 hour  Intake    660 ml  Output    300 ml  Net    360 ml   Weight change: -3.151 kg (-6 lb 15.2 oz) NVV:YXAJL and alert CVS:RRR Resp:basilar crackles Abd:+ BS NTND Ext:no edema NEURO:CNI O x2   . aspirin EC  81 mg Oral Daily  . enoxaparin (LOVENOX) injection  30 mg Subcutaneous Q24H  . furosemide  100 mg Intravenous BID  . insulin aspart  0-9 Units Subcutaneous TID WC  . levofloxacin  500 mg Oral Q48H  . sodium bicarbonate  1,300 mg Oral BID   No results found. BMET    Component Value Date/Time   NA 134* 06/16/2013 0445   K 4.7 06/16/2013 0445   CL 100 06/16/2013 0445   CO2 15* 06/16/2013 0445   GLUCOSE 142* 06/16/2013 0445   BUN 120* 06/16/2013 0445   CREATININE 3.91* 06/16/2013 0445   CALCIUM 8.9 06/16/2013 0445   GFRNONAA 12* 06/16/2013 0445   GFRAA 14* 06/16/2013 0445   CBC    Component Value Date/Time   WBC 7.2 06/16/2013 0445   RBC 2.95* 06/16/2013 0445   RBC 3.13* 12/03/2012 0953   HGB 9.7* 06/16/2013 0445   HCT 28.7* 06/16/2013 0445   PLT 222 06/16/2013 0445   MCV 97.3 06/16/2013 0445   MCH 32.9 06/16/2013 0445   MCHC 33.8 06/16/2013 0445   RDW 17.7* 06/16/2013 0445   LYMPHSABS 1.0 06/09/2013 1432   MONOABS 0.6 06/09/2013 1432   EOSABS 0.1 06/09/2013 1432   BASOSABS 0.0 06/09/2013 1432     Assessment: 1. Acute on CKD 4, renal fx slowly improving 2. Anemia Sp transfusion 3. Met acidosis on bicarb 4. ? Aspiration PNA, on antibiotics Plan: 1. Cont to treat PNA 2. Follow renal fx daily.  Reiterated to son that will treat renal failure conservatively without HD.  I think his prognosis is poor 3. I/O may be difficult with catheter out  Tameria Patti T

## 2013-06-17 NOTE — Progress Notes (Signed)
Pt restless off and on during the night, moaned and groaned, however when asked if he was in pain, patient stated no, will continue to monitor

## 2013-06-17 NOTE — Progress Notes (Signed)
Speech Language Pathology Treatment: Dysphagia  Patient Details Name: [name] MRN: [id number] DOB: [birth year] Today's Date: 06/17/2013 Time: 3329-5188 SLP Time Calculation (min): 26 min  Assessment / Plan / Recommendation Clinical Impression  Pt. Sitting up in chair with emesis basin resting on chest.  Yellow sputum in basin.  Pt. Reports he's nauseous (ongoing since this am).  Pt. Was able to take sips of nectar thick ginger ale without overt difficulty.     HPI HPI: [name] is a 80 y.o. male with PMH of PAD, HTN, combined systolic and Diastolic CHF, EF 41%, atrial fibrillation, AOCD, CKD stage 3-4 at baseline and DM type 2; was brought to the ER with weakness and shortness of breath. He was just discharged from [facility] 12 days ago after admission for AKI and UTI, his lasix was stopped and discharged home to the care of his 24x7 care givers. Pt is a very poor historian, his son reports that in the last week he has been more short of breath and dyspneic with minimal activity and coughing more. He started giving him some of his old Po lasix daily for 2days. He has HCAP and renal failure. Pt sometimes vomit/spits up food and caregiver confirms occasional coughing with PO.    Pertinent Vitals Expiratory wheeze; productive cough (yellow sputum); appears SOB even at rest; afebrile. Last CXR 06/14/13 mild progression of bilateral patchy and nodular pulmonary opacity, favor progressions of pneumonia.   SLP Plan  Continue with current plan of care    Recommendations Diet recommendations: Dysphagia 3 (mechanical soft);Nectar-thick liquid Liquids provided via: Cup Medication Administration: Whole meds with puree Supervision: Staff to assist with self feeding;Trained caregiver to feed patient Compensations: Slow rate;Small sips/bites Postural Changes and/or Swallow Maneuvers: Seated upright 90 degrees              Oral Care Recommendations: Oral care BID Follow up Recommendations: 24  hour supervision/assistance;Home health SLP Plan: Continue with current plan of care    GO     Quinn Axe T 06/17/2013, 4:27 PM

## 2013-06-18 LAB — GLUCOSE, CAPILLARY
GLUCOSE-CAPILLARY: 168 mg/dL — AB (ref 70–99)
Glucose-Capillary: 130 mg/dL — ABNORMAL HIGH (ref 70–99)
Glucose-Capillary: 156 mg/dL — ABNORMAL HIGH (ref 70–99)
Glucose-Capillary: 163 mg/dL — ABNORMAL HIGH (ref 70–99)

## 2013-06-18 LAB — BASIC METABOLIC PANEL
BUN: 125 mg/dL — ABNORMAL HIGH (ref 6–23)
CALCIUM: 9 mg/dL (ref 8.4–10.5)
CO2: 18 mEq/L — ABNORMAL LOW (ref 19–32)
Chloride: 108 mEq/L (ref 96–112)
Creatinine, Ser: 2.67 mg/dL — ABNORMAL HIGH (ref 0.50–1.35)
GFR, EST AFRICAN AMERICAN: 22 mL/min — AB (ref >90)
GFR, EST NON AFRICAN AMERICAN: 19 mL/min — AB (ref >90)
Glucose, Bld: 121 mg/dL — ABNORMAL HIGH (ref 70–99)
POTASSIUM: 4.8 meq/L (ref 3.7–5.3)
SODIUM: 144 meq/L (ref 137–147)

## 2013-06-18 MED ORDER — MORPHINE SULFATE (CONCENTRATE) 10 MG /0.5 ML PO SOLN
5.0000 mg | ORAL | Status: DC | PRN
  Administered 2013-06-18: 5 mg via ORAL
  Filled 2013-06-18: qty 0.5

## 2013-06-18 MED ORDER — FUROSEMIDE 80 MG PO TABS
120.0000 mg | ORAL_TABLET | Freq: Two times a day (BID) | ORAL | Status: DC
  Administered 2013-06-18 – 2013-06-19 (×2): 120 mg via ORAL
  Filled 2013-06-18 (×4): qty 1

## 2013-06-18 NOTE — Progress Notes (Signed)
Pt alert to self, pt c/o pain in feet PRN tylenol given as ordered, vss, pt oob to chair, pt stable

## 2013-06-18 NOTE — Progress Notes (Signed)
Physical Therapy Treatment Patient Details Name: [name] MRN: [id number] DOB: 1917-09-16 Today's Date: 06/18/2013 Time: 3419-3790 PT Time Calculation (min): 28 min  PT Assessment / Plan / Recommendation  History of Present Illness caregiver in room states she does not think she will be able to take care of him   PT Comments   Mr. [name] is requiring 2 people for mobility and is unable to walk household distances  Follow Up Recommendations  SNF;Supervision/Assistance - 24 hour     Does the patient have the potential to tolerate intense rehabilitation     Barriers to Discharge        Equipment Recommendations  Wheelchair (measurements PT)    Recommendations for Other Services    Frequency     Progress towards PT Goals Progress towards PT goals: Progressing toward goals  Plan Current plan remains appropriate    Precautions / Restrictions Precautions Precautions: Fall Restrictions Weight Bearing Restrictions: No   Pertinent Vitals/Pain No c/o pain    Mobility  Bed Mobility Overal bed mobility: Needs Assistance Bed Mobility: Supine to Sit Supine to sit: Mod assist General bed mobility comments: needs assist to raise trunk to sitting  Transfers Overall transfer level: Needs assistance Equipment used: Rolling walker (2 wheeled) Transfers: Sit to/from [facility] Sit to Stand: Mod assist;+2 physical assistance Stand pivot transfers: Mod assist;+2 physical assistance General transfer comment: pt grunts loudly with effort.  He needs assist to lift hips off chair and has difficulty standing up straight at times, at other times he partially stands up without close supervision, demonstrating high risk for fall. Ambulation/Gait Ambulation/Gait assistance: Mod assist;+2 physical assistance Ambulation Distance (Feet): 3 Feet Assistive device: Rolling walker (2 wheeled) Gait Pattern/deviations: Decreased stride length;Trunk flexed;Narrow base of  support;Shuffle Gait velocity: decreased General Gait Details: pt very dyspneic with exertion    Exercises     PT Diagnosis:    PT Problem List:   PT Treatment Interventions:     PT Goals (current goals can now be found in the care plan section)    Visit Information  Last PT Received On: 06/18/13 Assistance Needed: +2 History of Present Illness: caregiver in room states she does not think she will be able to take care of him    Subjective Data      Cognition  Cognition Arousal/Alertness: Awake/alert Behavior During Therapy: Flat affect Overall Cognitive Status: Difficult to assess General Comments: "If i knew what was wrong, I wouldn't be here"    Balance  Balance Overall balance assessment: Needs assistance Sitting-balance support: Bilateral upper extremity supported;Feet supported Sitting balance-Leahy Scale: Fair Sitting balance - Comments: keeps trunk flexed with posterior lean Standing balance-Leahy Scale: Poor Standing balance comment: pt is unable to maintain upright posture; appears to be expending increased energy for standing General Comments General comments (skin integrity, edema, etc.): deconditioned, spitting on chair, tries to get up unassisted  End of Session PT - End of Session Equipment Utilized During Treatment: Gait belt Activity Tolerance: Patient limited by fatigue Patient left: with call bell/phone within reach;in chair;with family/visitor present Nurse Communication: Mobility status   GP   Helene Kelp K. Owens Shark, [place] 06/18/2013, 11:53 AM

## 2013-06-18 NOTE — Progress Notes (Addendum)
Notified by [facility], patient and family request services of [facility] after discharge.  Spoke with d-i-l Tiuffany and  initiated education related to hospice services, philosophy and team approach to care with good understanding voiced by son, 'Til' and d-i-l Tiffany; also discussed with dtr Jani Gravel via telephone.  Per notes/discussion plan is to d/c home via [facility] once arrangements are in place *Please send completed GOLD DNR form home with pt  DME needs discussed - pt currently has a hospital bed and wheelchair through [employer] - the family is agreeable to switching this equipment out as [facility] is contracted with [place]; pt's d-i-l Tiffany will contact [employer] to request they pick up this equipment  Database administrator spoke with [employer], she will contact [employer] rep to request Complete Pkg D: fully electric hospital bed with AP&P mattress and full rails, over-bed table and add a light weight wheel chair to this package to be delivered later today; additional discussion with pt's family and [facility] CMRN- pt has been on O2 at [facility] intermittently during this hospital stay as well as Neb treatments - may want to add O2 pkg B to order for pt comfort at home if Dr Laurann Montana agrees   *please contact [employer] @ [phone] to arrange delivery - address on facesheet is correct: [place]  Initial paperwork faxed to [facility]   Completed d/c summary will need to be faxed to [place] @ [phone] when final Please notify [facility] when patient is ready to leave unit at d/c call [phone] (or [phone] if after 5 pm); [facility] information and contact numbers also given to son 'Til' and dtr-in-law during phone discussion.   Above information shared with [facility] Please call with any questions or concerns   Danton Sewer, RN 06/18/2013, 1:31 PM [facility] Liaison [phone]

## 2013-06-18 NOTE — Progress Notes (Addendum)
Subjective: No complaints today, eating well  Objective: Vital signs in last 24 hours: Temp:  [97.5 F (36.4 C)-98.7 F (37.1 C)] 97.5 F (36.4 C) (03/10 0619) Pulse Rate:  [45-74] 74 (03/10 0619) Resp:  [21-22] 21 (03/10 0619) BP: (124-132)/(38-44) 126/41 mmHg (03/10 0619) SpO2:  [98 %-100 %] 100 % (03/10 0722) Weight:  [96.8 kg (213 lb 6.5 oz)] 96.8 kg (213 lb 6.5 oz) (03/10 0619) Weight change: -1.7 kg (-3 lb 12 oz) Last BM Date: 06/16/13  Intake/Output from previous day: 03/09 0701 - 03/10 0700 In: 600 [P.O.:600] Out: -  Intake/Output this shift:    General appearance: alert Resp: clear to auscultation bilaterally Cardio: regular rate and rhythm, S1, S2 normal, no murmur, click, rub or gallop Extremities: extremities normal, atraumatic, no cyanosis or edema  Lab Results:  Recent Labs  06/16/13 0445  WBC 7.2  HGB 9.7*  HCT 28.7*  PLT 222   BMET  Recent Labs  06/16/13 0445 06/18/13 0258  NA 134* 144  K 4.7 4.8  CL 100 108  CO2 15* 18*  GLUCOSE 142* 121*  BUN 120* 125*  CREATININE 3.91* 2.67*  CALCIUM 8.9 9.0    Studies/Results: No results found.  Medications: I have reviewed the patient's current medications.  Assessment/Plan: 1. Acute on chronic Renal Failure creatinine dropping, BUN up, ? Scale back diuretics.  On NaHCO3 for metabolic acidosis, will continue as outpatient 2. Pneumonia, last dose on antibiotics today. D3 diet with nectar thick liquids  3. Chronic Combined systolic and Diastolic CHF stable, No ACEI given renal function and no beta blocker given bradycardia(atrial flutter with slow ventricular response) .Echo shows near normal EF with grade II diastolic dysfunction. Cardiology input noted- no pacemaker.  4. Anemia of chronic disease follow transfusion 3/4 - count stable  5. DM D/C  SSI 6. HTN BP low, stable  7. No Code Blue.palliative care goals of care meeting yesterday, home with hospice tomorrow 8. Moderate protein malnutrition-  supportive care- supplement    LOS: 9 days   [name] [name] 06/18/2013, 7:27 AM

## 2013-06-18 NOTE — Progress Notes (Signed)
The patient's HR stayed mostly in the 40s to 50s overnight.  He switched between SB, A fib, and first degree heart block.  He did complain of generalized pain once overnight and received Tylenol with relief.  He is oriented to self only and calls out/moans during the night.

## 2013-06-18 NOTE — Progress Notes (Signed)
S:Says he feels better O:BP 126/41  Pulse 74  Temp(Src) 97.5 F (36.4 C) (Oral)  Resp 21  Ht 5' 1.81" (1.57 m)  Wt 96.8 kg (213 lb 6.5 oz)  BMI 39.27 kg/m2  SpO2 100%  Intake/Output Summary (Last 24 hours) at 06/18/13 0844 Last data filed at 06/18/13 0830  Gross per 24 hour  Intake    720 ml  Output      0 ml  Net    720 ml   Weight change: -1.7 kg (-3 lb 12 oz) KVT:XLEZV and alert CVS:RRR Resp:basilar crackles, rare wheeeze Abd:+ BS NTND Ext:no edema NEURO:CNI O x2   . aspirin EC  81 mg Oral Daily  . enoxaparin (LOVENOX) injection  30 mg Subcutaneous Q24H  . furosemide  100 mg Intravenous BID  . levofloxacin  500 mg Oral Q48H  . sodium bicarbonate  1,300 mg Oral BID   No results found. BMET    Component Value Date/Time   NA 144 06/18/2013 0258   K 4.8 06/18/2013 0258   CL 108 06/18/2013 0258   CO2 18* 06/18/2013 0258   GLUCOSE 121* 06/18/2013 0258   BUN 125* 06/18/2013 0258   CREATININE 2.67* 06/18/2013 0258   CALCIUM 9.0 06/18/2013 0258   GFRNONAA 19* 06/18/2013 0258   GFRAA 22* 06/18/2013 0258   CBC    Component Value Date/Time   WBC 7.2 06/16/2013 0445   RBC 2.95* 06/16/2013 0445   RBC 3.13* 12/03/2012 0953   HGB 9.7* 06/16/2013 0445   HCT 28.7* 06/16/2013 0445   PLT 222 06/16/2013 0445   MCV 97.3 06/16/2013 0445   MCH 32.9 06/16/2013 0445   MCHC 33.8 06/16/2013 0445   RDW 17.7* 06/16/2013 0445   LYMPHSABS 1.0 06/09/2013 1432   MONOABS 0.6 06/09/2013 1432   EOSABS 0.1 06/09/2013 1432   BASOSABS 0.0 06/09/2013 1432     Assessment: 1. Acute on CKD 4, renal fx slowly improving 2. Anemia Sp transfusion 3. Met acidosis on bicarb 4. ? Aspiration PNA, on antibiotics Plan: 1. Change to PO lasix 2. Daily Scr  [name] T

## 2013-06-18 NOTE — Progress Notes (Signed)
Progress Note from the Palliative Medicine Team at [facility]: [name] has just gotten up with physical therapy into the chair. PT says he is very weak and is a 2 person assist for any ambulation. He is very lethargic today and complains of vague/generalized pain and then drifts to sleep. He is on room air and denies shortness of breath. He continues to have atrial fibrillation with slow ventricular response and is not a candidate for pacemaker placement. His renal function has improved today. I did speak with his daughter, Maudie Mercury, via telephone and told her that he is still very weak and requiring 2 person assist and that the caregiver in the room is concerned about being able to care for him at home. Maudie Mercury assures me that they will have the help to manage him at home. She also tells me that she hopes that he gets stronger once he is home and I told her that although I cannot be certain, I do not believe that he will get stronger once home due to his chronic illness r/t CHF and probably aspiration pneumonia. She acknowledges that this is a possibility. I do believe [name] family are trying to be realistic about his prognosis but want to still be hopeful. I believe that hospice can help them navigate his illness and prognosis. I will continue to follow and support [name] and his family holistically.     Objective: Allergies  Allergen Reactions  . Penicillins Swelling  . Sulfa Antibiotics Other (See Comments)    unknown   Scheduled Meds: . aspirin EC  81 mg Oral Daily  . enoxaparin (LOVENOX) injection  30 mg Subcutaneous Q24H  . furosemide  120 mg Oral BID  . levofloxacin  500 mg Oral Q48H  . sodium bicarbonate  1,300 mg Oral BID   Continuous Infusions:  PRN Meds:.acetaminophen, albuterol, bisacodyl, ondansetron (ZOFRAN) IV, RESOURCE THICKENUP CLEAR  BP 126/41  Pulse 74  Temp(Src) 97.5 F (36.4 C) (Oral)  Resp 21  Ht 5' 1.81" (1.57 m)  Wt 96.8 kg (213 lb 6.5 oz)   BMI 39.27 kg/m2  SpO2 100%   PPS: 30% Pain Location legs   Intake/Output Summary (Last 24 hours) at 06/18/13 1151 Last data filed at 06/18/13 1046  Gross per 24 hour  Intake    360 ml  Output    600 ml  Net   -240 ml      LBM: 06/16/13      Physical Exam:  General: NAD, lethargic, ill appearing HEENT: [place]/AT, no JVD, moist mucous membranes  Chest: Bibasilar crackles, no labored breathing  CVS: Bradycardic, irreg, S1 S2   Abdomen: Soft, NT, ND, +BS  Ext: MAE, no edema, warm to touch  Neuro: Alert, oriented to person/place, follows commands   Labs: CBC    Component Value Date/Time   WBC 7.2 06/16/2013 0445   RBC 2.95* 06/16/2013 0445   RBC 3.13* 12/03/2012 0953   HGB 9.7* 06/16/2013 0445   HCT 28.7* 06/16/2013 0445   PLT 222 06/16/2013 0445   MCV 97.3 06/16/2013 0445   MCH 32.9 06/16/2013 0445   MCHC 33.8 06/16/2013 0445   RDW 17.7* 06/16/2013 0445   LYMPHSABS 1.0 06/09/2013 1432   MONOABS 0.6 06/09/2013 1432   EOSABS 0.1 06/09/2013 1432   BASOSABS 0.0 06/09/2013 1432    BMET    Component Value Date/Time   NA 144 06/18/2013 0258   K 4.8 06/18/2013 0258   CL 108 06/18/2013 0258  CO2 18* 06/18/2013 0258   GLUCOSE 121* 06/18/2013 0258   BUN 125* 06/18/2013 0258   CREATININE 2.67* 06/18/2013 0258   CALCIUM 9.0 06/18/2013 0258   GFRNONAA 19* 06/18/2013 0258   GFRAA 22* 06/18/2013 0258    CMP     Component Value Date/Time   NA 144 06/18/2013 0258   K 4.8 06/18/2013 0258   CL 108 06/18/2013 0258   CO2 18* 06/18/2013 0258   GLUCOSE 121* 06/18/2013 0258   BUN 125* 06/18/2013 0258   CREATININE 2.67* 06/18/2013 0258   CALCIUM 9.0 06/18/2013 0258   PROT 6.9 06/09/2013 1432   ALBUMIN 1.9* 06/15/2013 0525   AST 47* 06/09/2013 1432   ALT 17 06/09/2013 1432   ALKPHOS 142* 06/09/2013 1432   BILITOT 0.3 06/09/2013 1432   GFRNONAA 19* 06/18/2013 0258   GFRAA 22* 06/18/2013 0258    Assessment and Plan: 1. Code Status: DNR 2. Symptom Control: 1. Pain: Low dose Roxanol prn. 2. Bowel Regimen: Dulcolax supp prn.   3. Fever: Acetaminophen prn.  4. Nausea: Ondansetron prn.  3. Psycho/Social: Emotional support provided to patient and family via telephone. 4. Disposition: Home with hospice.   Patient Documents Completed or Given: Document Given Completed  Advanced Directives Pkt    MOST    DNR  yes  Gone from [facility]    Hard Choices      Time In Time Out Total Time Spent with Patient Total Overall Time  1100 1130 2min 44min    Greater than 50%  of this time was spent counseling and coordinating care related to the above assessment and plan.  Vinie Sill, NP Palliative Medicine Team Pager # [phone] (M-F 8a-5p) Team Phone # [phone] (Nights/Weekends)   1

## 2013-06-18 NOTE — Consult Note (Signed)
I have reviewed this case with our NP and agree with the Assessment and Plan as stated.  Fitzroy Mikami L. Shamara Soza, MD MBA The Palliative Medicine Team at [place] Team Phone: [phone] Pager: [phone]   

## 2013-06-19 LAB — BASIC METABOLIC PANEL
BUN: 115 mg/dL — AB (ref 6–23)
CHLORIDE: 111 meq/L (ref 96–112)
CO2: 20 mEq/L (ref 19–32)
Calcium: 9 mg/dL (ref 8.4–10.5)
Creatinine, Ser: 2.26 mg/dL — ABNORMAL HIGH (ref 0.50–1.35)
GFR calc Af Amer: 27 mL/min — ABNORMAL LOW (ref >90)
GFR, EST NON AFRICAN AMERICAN: 23 mL/min — AB (ref >90)
Glucose, Bld: 167 mg/dL — ABNORMAL HIGH (ref 70–99)
POTASSIUM: 4.6 meq/L (ref 3.7–5.3)
SODIUM: 145 meq/L (ref 137–147)

## 2013-06-19 LAB — GLUCOSE, CAPILLARY
GLUCOSE-CAPILLARY: 158 mg/dL — AB (ref 70–99)
GLUCOSE-CAPILLARY: 174 mg/dL — AB (ref 70–99)
Glucose-Capillary: 158 mg/dL — ABNORMAL HIGH (ref 70–99)

## 2013-06-19 LAB — CBC
HEMATOCRIT: 28.8 % — AB (ref 39.0–52.0)
HEMOGLOBIN: 9.7 g/dL — AB (ref 13.0–17.0)
MCH: 33.4 pg (ref 26.0–34.0)
MCHC: 33.7 g/dL (ref 30.0–36.0)
MCV: 99.3 fL (ref 78.0–100.0)
Platelets: 260 10*3/uL (ref 150–400)
RBC: 2.9 MIL/uL — AB (ref 4.22–5.81)
RDW: 18 % — ABNORMAL HIGH (ref 11.5–15.5)
WBC: 6.6 10*3/uL (ref 4.0–10.5)

## 2013-06-19 MED ORDER — FUROSEMIDE 40 MG PO TABS: 80.0000 mg | ORAL_TABLET | Freq: Two times a day (BID) | ORAL | Status: AC

## 2013-06-19 MED ORDER — MORPHINE SULFATE (CONCENTRATE) 10 MG /0.5 ML PO SOLN: 5.0000 mg | ORAL | Status: AC | PRN

## 2013-06-19 MED ORDER — SODIUM BICARBONATE 650 MG PO TABS: 1300.0000 mg | ORAL_TABLET | Freq: Two times a day (BID) | ORAL | Status: AC

## 2013-06-19 NOTE — Discharge Summary (Signed)
Physician Discharge Summary  Patient ID: [name] MRN: [id number] DOB/AGE: 1918-02-22 80 y.o.  Admit date: 06/09/2013 Discharge date: 06/19/2013  Admission Diagnoses: Acute on chronic kidney failure Pneumonia Coronary artery disease Congestive heart failure, diastolic dysfunction Diabetes mellitus type 2 Chronic kidney disease stage IV Hypertension Anemia  Discharge Diagnoses:  Principal Problem:  Acute on chronic kidney failure Active Problems: Aspiration pneumonia   Coronary artery disease   CKD (chronic kidney disease), stage IV Chronic congestive heart failure, diastolic Diabetes mellitus type 2 Anemia of renal disease, transfusions Hypertension   Atrial flutter with slow in response   Palliative care encounter   DNR (do not resuscitate) BPH   Discharged Condition: fair  Hospital Course: The patient was admitted on 06/09/2013 complaining of generalized weakness and shortness of breath. In the last week prior to admission more short of breath and dyspneic with minimal activity and had some cough. In the ER chest x-ray showed progression of left lower lung space disease. Creatinine was up to 2.34 and hemoglobin was 8.5. The patient was given IV Lasix with subsequent rise in his creatinine. He was given IV fluids with normal saline with continued rise in creatinine. The patient was seen in consultation by nephrology. Nephrology felt that his decreased renal function was related to severe anemia and low blood pressure and bradycardia, he did not appear to be in significant congestive heart failure. He also felt that aspiration pneumonia was playing a role. The patient was given 2 units of PRBCs. Dialysis was discussed with his family and all agreed that he would not go to hemodialysis candidate. After transfusion creatinine continued to rise to a high of about 4.3 where it stabilized. Renal ultrasound showed no obstruction. He was eventually placed on high-dose Lasix and began  having improvement in his creatinine, at discharge creatinine down to 2.26 with BUN 1:15 and improved urine output. The patient will be discharged on twice a day Lasix as well as was started on sodium bicarbonate for metabolic acidosis related to his renal failure. The patient was treated with 10 days of vancomycin and aztreonam followed by levofloxacin for aspiration pneumonia. He was seen by speech therapy and had modified barium swallow showing mild oral phase dysphagia and mild pharyngeal phase dysphagia, dysphasia 3 diet with nectar thick liquids recommended. The patient did have transfusion for unit PRBC with hemoglobin 9.7 at discharge. He was seen by cardiology in consultation who felt that he was not significant congestive heart failure. 2-D echocardiogram showed near normal ejection fraction with grade 2 diastolic dysfunction. He did have bradycardia with heart rate as low as the high 30s but at discharge heart rate running in the 60s. The patient was seen by physical therapy who recommended nursing home placement because of his severe generalized weakness but the family opted to take the patient home. He was seen in consultation by palliative care and goals of therapy conference undertaken, mainstem and also comfort although they would accept antibiotics, blood products, IV fluids and laboratories if needed in the future. He was made no CODE BLUE. His family understand that his prognosis is limited.  As for the patient's diabetes his blood sugars remained in the low to mid 100s off all treatment and glipizide was held at discharge. He also had tamsulosin stop because of possibly causing hypotension and blood pressure did improve after it was stopped. Isosorbide was also stopped because of low blood pressure   CODE STATUS no CODE BLUE Diet dysphagia 3 diet with nectar  thickened liquids  Consults: cardiology, nephrology and Palliative care  Significant Diagnostic Studies: labs: At discharge sodium  145, potassium 4.6, chloride 111, bicarbonate 20, BUN 15, creatinine 2.26, WBC 6.6, hemoglobin 9.7, platelet count 260 radiology: CXR: Chest x-ray March 6 showed bilateral patchy nodular pulmonary opacities favoring pneumonia, Ultrasound: Renal ultrasound no obstruction, medical renal disease and Modified barium swallow as above and cardiac graphics: Echocardiogram: Ejection fraction 32-99%, grade 2 diastolic dysfunction, mild aortic stenosis, mild left atrial dilation  Treatments: IV hydration, antibiotics: vancomycin, Levaquin and history and, analgesia: Morphine, cardiac meds: furosemide, anticoagulation: LMW heparin, insulin: Humalog and therapies: PT, OT and ST  Discharge Exam: Blood pressure 148/86, pulse 72, temperature 98.4 F (36.9 C), temperature source Oral, resp. rate 22, height 5' 1.81" (1.57 m), weight 95.86 kg (211 lb 5.3 oz), SpO2 100.00%. Resp: clear to auscultation bilaterally Cardio: regular rate and rhythm, S1, S2 normal, no murmur, click, rub or gallop  Disposition: 01-Home with hospice care   Future Appointments Provider Department Dept Phone   08/26/2013 10:45 AM Sueanne Margarita, MD [facility] [phone]       Medication List    STOP taking these medications       cilostazol 100 MG tablet  Commonly known as:  PLETAL     glipiZIDE 2.5 MG 24 hr tablet  Commonly known as:  GLUCOTROL XL     isosorbide mononitrate 30 MG 24 hr tablet  Commonly known as:  IMDUR     OCUVITE PRESERVISION Tabs     tamsulosin 0.4 MG Caps capsule  Commonly known as:  FLOMAX      TAKE these medications       albuterol 108 (90 BASE) MCG/ACT inhaler  Commonly known as:  PROVENTIL HFA;VENTOLIN HFA  Inhale 2 puffs into the lungs daily as needed for wheezing or shortness of breath.     aspirin EC 81 MG tablet  Take 81 mg by mouth daily.     cyanocobalamin 1000 MCG tablet  Take 1,000 mcg by mouth 3 (three) times a week. Take 1000 mcg on Monday, Wednesday, and Friday      furosemide 40 MG tablet  Commonly known as:  LASIX  Take 2 tablets (80 mg total) by mouth 2 (two) times daily.     morphine CONCENTRATE 10 mg / 0.5 ml concentrated solution  Take 0.25 mLs (5 mg total) by mouth every 3 (three) hours as needed for moderate pain or shortness of breath.     sodium bicarbonate 650 MG tablet  Take 2 tablets (1,300 mg total) by mouth 2 (two) times daily.           Follow-up Information   [facility] with [facility] ([facility] to follow aftr d/c; pls notify whn pt ready to d/c call [phone] (or [phone] aftr 5 pm))    Contact information:   [place], [place]      Follow up with Irven Shelling, MD. (As needed)    Specialty:  Internal Medicine   Contact information:   [address], [address] [place] [address] [phone]       Signed: Irven Shelling 06/19/2013, 7:36 AM

## 2013-06-19 NOTE — Therapy (Signed)
Speech Language Pathology Treatment: Dysphagia  Patient Details Name: [name] MRN: [id number] DOB: [birth year] Today's Date: 06/19/2013 Time: 1100-1120 SLP Time Calculation (min): 20 min  Assessment / Plan / Recommendation Clinical Impression  Pt consumed sips of nectar-thickened liquids (small and large) with cough after larger sips of nectar thickened liquids; resolved with small sips; educated caregiver/pt of increased risk of aspiration with larger sips/straw sips of nectar-thickened liquids; pt and caregiver agreed with recommendation; consumed Dys 3 consistency without s/s of aspiration noted; slow but thorough mastication noted with Dys 3; caregiver stated she "chops up all of his food" with each meal; educated on thickening liquids to nectar consistency with all liquids and risk for aspiration with items such as juice from fruits/vegs; instructed to drain these before pt intake; pt to be discharged today per nursing staff; f/u with home health SLP for diet/liquid tolerance and swallow precautions   HPI HPI: [name] is a 80 y.o. male with PMH of PAD, HTN, combined systolic and Diastolic CHF, EF 83%, atrial fibrillation, AOCD, CKD stage 3-4 at baseline and DM type 2; was brought to the ER with weakness and shortness of breath. He was just discharged from [facility] 12 days ago after admission for AKI and UTI, his lasix was stopped and discharged home to the care of his 24x7 care givers. Pt is a very poor historian, his son reports that in the last week he has been more short of breath and dyspneic with minimal activity and coughing more. He started giving him some of his old Po lasix daily for 2days. He has HCAP and renal failure. Pt sometimes vomit/spits up food and caregiver confirms occasional coughing with PO.    Pertinent Vitals BP elevated intermittently; no temp spikes noted  SLP Plan  Continue with current plan of care    Recommendations Diet recommendations: Dysphagia 3  (mechanical soft);Nectar-thick liquid (small sips; no straws) Liquids provided via: Cup Medication Administration: Whole meds with puree Supervision: Staff to assist with self feeding;Trained caregiver to feed patient Compensations: Slow rate;Small sips/bites Postural Changes and/or Swallow Maneuvers: Seated upright 90 degrees              Oral Care Recommendations: Oral care BID Follow up Recommendations: 24 hour supervision/assistance;Home health SLP Plan: Continue with current plan of care         Spyridon Hornstein,PAT, CCC-SLP 06/19/2013, 11:46 AM

## 2013-06-19 NOTE — Progress Notes (Signed)
S:Says he feels better O:BP 148/86  Pulse 72  Temp(Src) 98.4 F (36.9 C) (Oral)  Resp 22  Ht 5' 1.81" (1.57 m)  Wt 95.86 kg (211 lb 5.3 oz)  BMI 38.89 kg/m2  SpO2 100%  Intake/Output Summary (Last 24 hours) at 06/19/13 0916 Last data filed at 06/19/13 4709  Gross per 24 hour  Intake    598 ml  Output   2250 ml  Net  -1652 ml   Weight change: -0.94 kg (-2 lb 1.2 oz) GGE:ZMOQH and alert CVS:RRR Resp:basilar crackles, rare wheeeze Abd:+ BS NTND Ext:no edema NEURO:CNI O x2   . aspirin EC  81 mg Oral Daily  . enoxaparin (LOVENOX) injection  30 mg Subcutaneous Q24H  . furosemide  120 mg Oral BID  . sodium bicarbonate  1,300 mg Oral BID   No results found. BMET    Component Value Date/Time   NA 145 06/19/2013 0548   K 4.6 06/19/2013 0548   CL 111 06/19/2013 0548   CO2 20 06/19/2013 0548   GLUCOSE 167* 06/19/2013 0548   BUN 115* 06/19/2013 0548   CREATININE 2.26* 06/19/2013 0548   CALCIUM 9.0 06/19/2013 0548   GFRNONAA 23* 06/19/2013 0548   GFRAA 27* 06/19/2013 0548   CBC    Component Value Date/Time   WBC 6.6 06/19/2013 0548   RBC 2.90* 06/19/2013 0548   RBC 3.13* 12/03/2012 0953   HGB 9.7* 06/19/2013 0548   HCT 28.8* 06/19/2013 0548   PLT 260 06/19/2013 0548   MCV 99.3 06/19/2013 0548   MCH 33.4 06/19/2013 0548   MCHC 33.7 06/19/2013 0548   RDW 18.0* 06/19/2013 0548   LYMPHSABS 1.0 06/09/2013 1432   MONOABS 0.6 06/09/2013 1432   EOSABS 0.1 06/09/2013 1432   BASOSABS 0.0 06/09/2013 1432     Assessment: 1. Acute on CKD 4, renal fx back to baseline 2. Anemia Sp transfusion 3. Met acidosis on bicarb 4. ? Aspiration PNA,  Plan: 1.Note plans for DC.  Spoke with caregiver.  She understands the goal is comfort and that palliative care is available to help provide any assistance  Shuayb Schepers T

## 2013-06-20 NOTE — Progress Notes (Signed)
I have reviewed this case with our NP and agree with the Assessment and Plan as stated.  Laparis Durrett L. Matie Dimaano, MD MBA The Palliative Medicine Team at [place] Team Phone: [phone] Pager: [phone]   

## 2013-06-29 NOTE — Progress Notes (Signed)
Patient's family has elected to take patient home with Hospice care and 24 hour caregiver support. Family met with Palliative Care team and home plan was developed. No further CSW needs identified.  CSW arranged EMS for transport. CSW signing off.  Lorie Phenix. [place], [place]

## 2013-06-29 NOTE — Clinical Social Work Psychosocial (Addendum)
Clinical Social Work Department BRIEF PSYCHOSOCIAL ASSESSMENT 06/14/2013  Patient:  [name], [name]     Account Number:  [id number]     Admit date:  06/09/2013  Clinical Social Worker:  Iona Coach  Date/Time:  06/14/2013 11:55 AM  Referred by:  Physician  Date Referred:  06/13/2013 Referred for  SNF Placement   Other Referral:   Interview type:  Other - See comment Other interview type:   Son  [name]    PSYCHOSOCIAL DATA Living Status:  ALONE Admitted from facility:   Level of care:   Primary support name:  [name]"  [name], [name].  [phone] Primary support relationship to patient:  CHILD, ADULT Degree of support available:   Strong support    CURRENT CONCERNS  Other Concerns:    SOCIAL WORK ASSESSMENT / PLAN Patient referred to CSW to talk to patient and famiy re: PT's recmomendation for SNF placement.  Patient is alert but does not talk much. He requested that CSW contact his son and talk to him as he did not feel well enough to talk. "Anythign  he wants me to do will be ok."  CSW met briefly with family and provided support; due to pateint's poor condition.  FL2 will be initiated and placed on chart for MD"s signature.   Assessment/plan status:  Psychosocial Support/Ongoing Assessment of Needs Other assessment/ plan:   Information/referral to community resources:   SNF list left in room for pateint's family to consider    PATIENT'S/FAMILY'S RESPONSE TO PLAN OF CARE: Pateint is alert but appears quite lethargic. He did not wish to talk to [place] as he stated he was not feeling well. Patient stated that he has a very supportive family and wants them iinvovled in his plan of care. CSW will follow up with family in next few days.

## 2013-07-08 ENCOUNTER — Other Ambulatory Visit: Payer: Self-pay | Admitting: Adult Health

## 2013-08-26 ENCOUNTER — Ambulatory Visit: Payer: Medicare HMO | Admitting: Cardiology

## 2013-09-30 ENCOUNTER — Ambulatory Visit (HOSPITAL_COMMUNITY): Admit: 2013-09-30 | Payer: Self-pay | Admitting: Cardiology

## 2013-09-30 ENCOUNTER — Encounter (HOSPITAL_COMMUNITY)
Admission: EM | Disposition: A | Discharge status: Home / Self Care | Payer: Self-pay | Source: Home / Self Care | Attending: Emergency Medicine

## 2013-09-30 ENCOUNTER — Emergency Department (HOSPITAL_COMMUNITY)
Admission: EM | Admit: 2013-09-30 | Discharge: 2013-09-30 | Disposition: A | Discharge status: Home / Self Care | Attending: Emergency Medicine | Admitting: Emergency Medicine

## 2013-09-30 ENCOUNTER — Encounter (HOSPITAL_COMMUNITY): Payer: Self-pay | Admitting: Emergency Medicine

## 2013-09-30 ENCOUNTER — Emergency Department (HOSPITAL_COMMUNITY)

## 2013-09-30 DIAGNOSIS — S90129A Contusion of unspecified lesser toe(s) without damage to nail, initial encounter: Secondary | ICD-10-CM | POA: Insufficient documentation

## 2013-09-30 DIAGNOSIS — Z8551 Personal history of malignant neoplasm of bladder: Secondary | ICD-10-CM | POA: Diagnosis not present

## 2013-09-30 DIAGNOSIS — Y92410 Unspecified street and highway as the place of occurrence of the external cause: Secondary | ICD-10-CM | POA: Insufficient documentation

## 2013-09-30 DIAGNOSIS — Z8673 Personal history of transient ischemic attack (TIA), and cerebral infarction without residual deficits: Secondary | ICD-10-CM | POA: Insufficient documentation

## 2013-09-30 DIAGNOSIS — N183 Chronic kidney disease, stage 3 unspecified: Secondary | ICD-10-CM | POA: Diagnosis not present

## 2013-09-30 DIAGNOSIS — Z87891 Personal history of nicotine dependence: Secondary | ICD-10-CM | POA: Diagnosis not present

## 2013-09-30 DIAGNOSIS — Z87440 Personal history of urinary (tract) infections: Secondary | ICD-10-CM | POA: Diagnosis not present

## 2013-09-30 DIAGNOSIS — I129 Hypertensive chronic kidney disease with stage 1 through stage 4 chronic kidney disease, or unspecified chronic kidney disease: Secondary | ICD-10-CM | POA: Diagnosis not present

## 2013-09-30 DIAGNOSIS — Y9389 Activity, other specified: Secondary | ICD-10-CM | POA: Insufficient documentation

## 2013-09-30 DIAGNOSIS — Z8701 Personal history of pneumonia (recurrent): Secondary | ICD-10-CM | POA: Insufficient documentation

## 2013-09-30 DIAGNOSIS — Z880 Allergy status to penicillin: Secondary | ICD-10-CM | POA: Insufficient documentation

## 2013-09-30 DIAGNOSIS — Z862 Personal history of diseases of the blood and blood-forming organs and certain disorders involving the immune mechanism: Secondary | ICD-10-CM | POA: Insufficient documentation

## 2013-09-30 DIAGNOSIS — Z79899 Other long term (current) drug therapy: Secondary | ICD-10-CM | POA: Insufficient documentation

## 2013-09-30 DIAGNOSIS — S90121A Contusion of right lesser toe(s) without damage to nail, initial encounter: Secondary | ICD-10-CM

## 2013-09-30 DIAGNOSIS — S8990XA Unspecified injury of unspecified lower leg, initial encounter: Secondary | ICD-10-CM | POA: Diagnosis present

## 2013-09-30 DIAGNOSIS — Z9861 Coronary angioplasty status: Secondary | ICD-10-CM | POA: Insufficient documentation

## 2013-09-30 DIAGNOSIS — I2510 Atherosclerotic heart disease of native coronary artery without angina pectoris: Secondary | ICD-10-CM | POA: Insufficient documentation

## 2013-09-30 DIAGNOSIS — W19XXXA Unspecified fall, initial encounter: Secondary | ICD-10-CM

## 2013-09-30 DIAGNOSIS — Z8659 Personal history of other mental and behavioral disorders: Secondary | ICD-10-CM | POA: Insufficient documentation

## 2013-09-30 DIAGNOSIS — S99919A Unspecified injury of unspecified ankle, initial encounter: Secondary | ICD-10-CM | POA: Diagnosis present

## 2013-09-30 DIAGNOSIS — G8929 Other chronic pain: Secondary | ICD-10-CM | POA: Diagnosis not present

## 2013-09-30 DIAGNOSIS — I509 Heart failure, unspecified: Secondary | ICD-10-CM | POA: Insufficient documentation

## 2013-09-30 DIAGNOSIS — Z8739 Personal history of other diseases of the musculoskeletal system and connective tissue: Secondary | ICD-10-CM | POA: Diagnosis not present

## 2013-09-30 DIAGNOSIS — W050XXA Fall from non-moving wheelchair, initial encounter: Secondary | ICD-10-CM | POA: Insufficient documentation

## 2013-09-30 DIAGNOSIS — E119 Type 2 diabetes mellitus without complications: Secondary | ICD-10-CM | POA: Diagnosis not present

## 2013-09-30 HISTORY — PX: LEFT HEART CATHETERIZATION WITH CORONARY ANGIOGRAM: SHX5451

## 2013-09-30 SURGERY — LEFT HEART CATHETERIZATION WITH CORONARY ANGIOGRAM
Anesthesia: LOCAL

## 2013-09-30 MED ORDER — STARCH (THICKENING) PO POWD
ORAL | Status: DC | PRN
  Filled 2013-09-30: qty 227
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
## 2013-10-09 DEATH — deceased

## 2013-10-25 ENCOUNTER — Telehealth: Payer: Self-pay | Admitting: Vascular Surgery

## 2013-10-25 NOTE — Telephone Encounter (Signed)
Hope Pigeon, [name]'s daughter-n-law called to let us know that Mr. Krapf passed away on 10-14-2013. He did not have an appointment coming up.  Thanks, Ebony Hail

## 2014-03-20 ENCOUNTER — Encounter (HOSPITAL_COMMUNITY): Payer: Self-pay | Admitting: Vascular Surgery

## 2014-04-24 ENCOUNTER — Encounter (HOSPITAL_COMMUNITY): Payer: Self-pay | Admitting: Vascular Surgery

## 2014-08-30 IMAGING — CR DG CHEST 1V PORT
2 series · 2 of 2 positions shown · non-contrast
Comparison: 12/01/2012

CLINICAL DATA: CHF versus pneumonia

PORTABLE CHEST - 1 VIEW

[AP (1 of 2)]
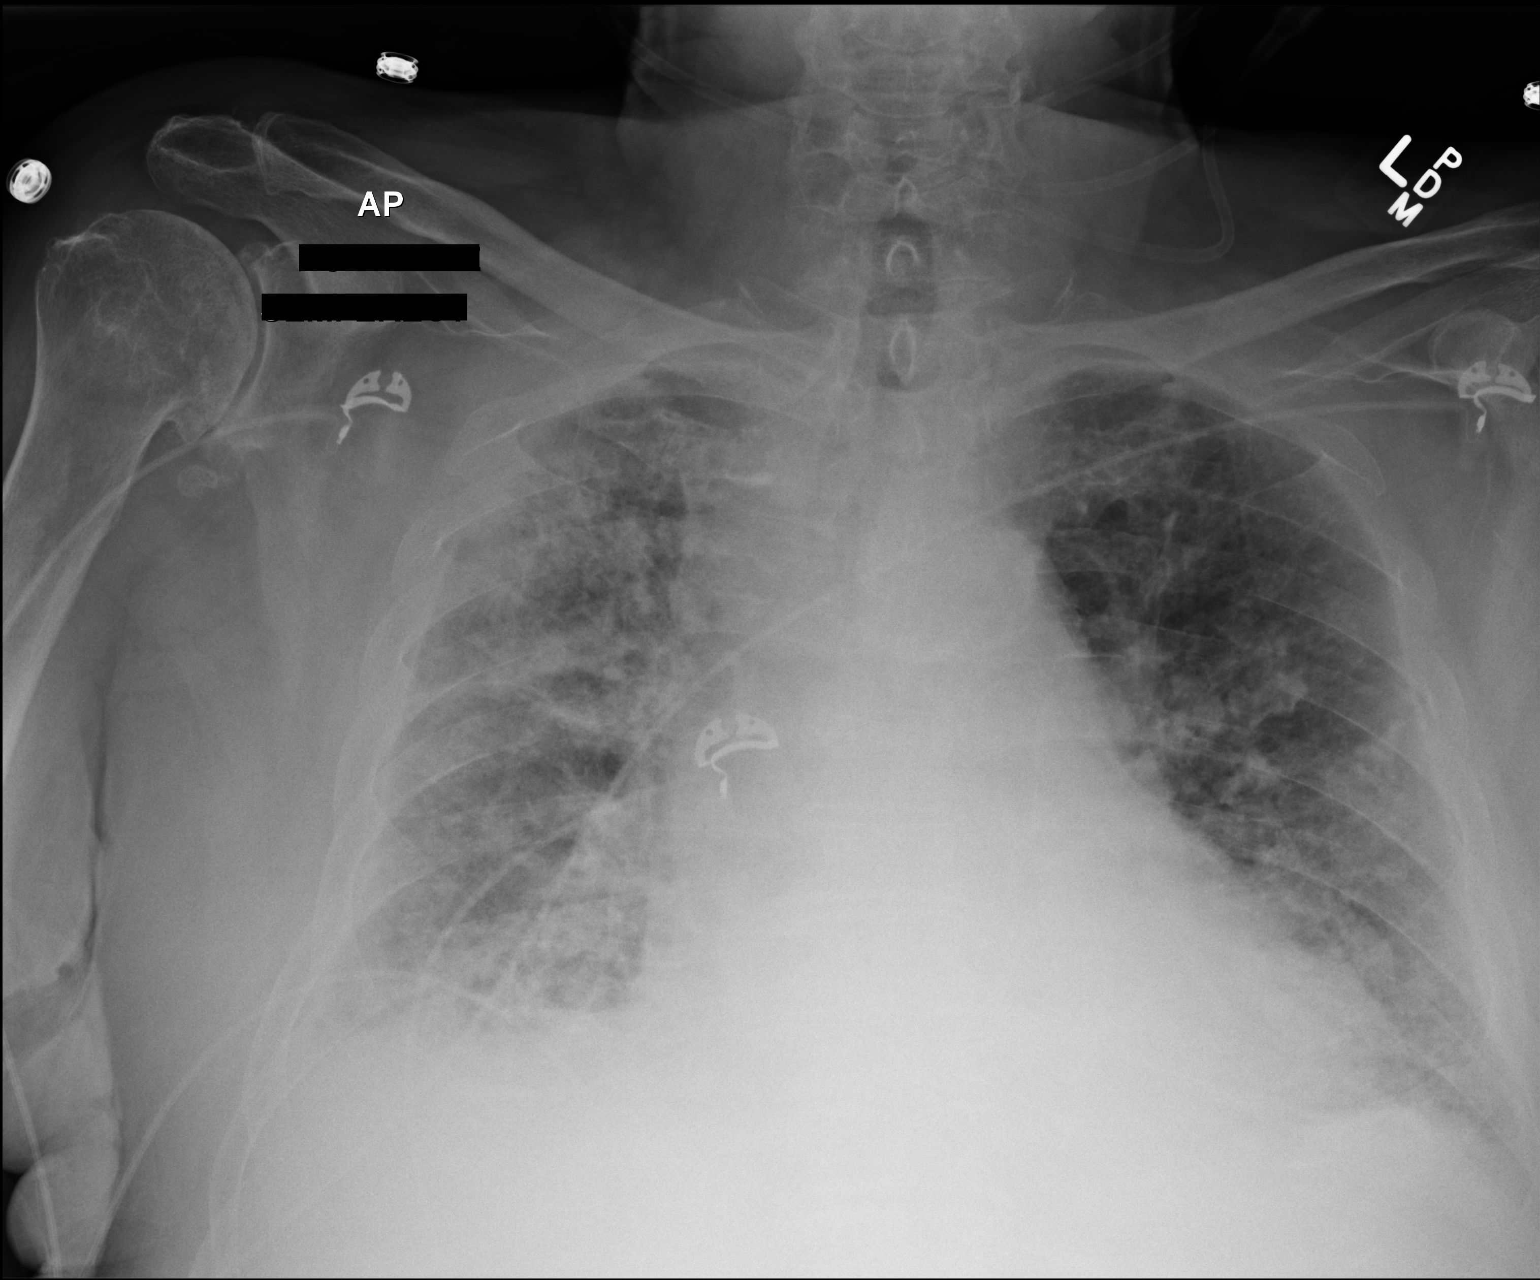

[AP (2 of 2)]
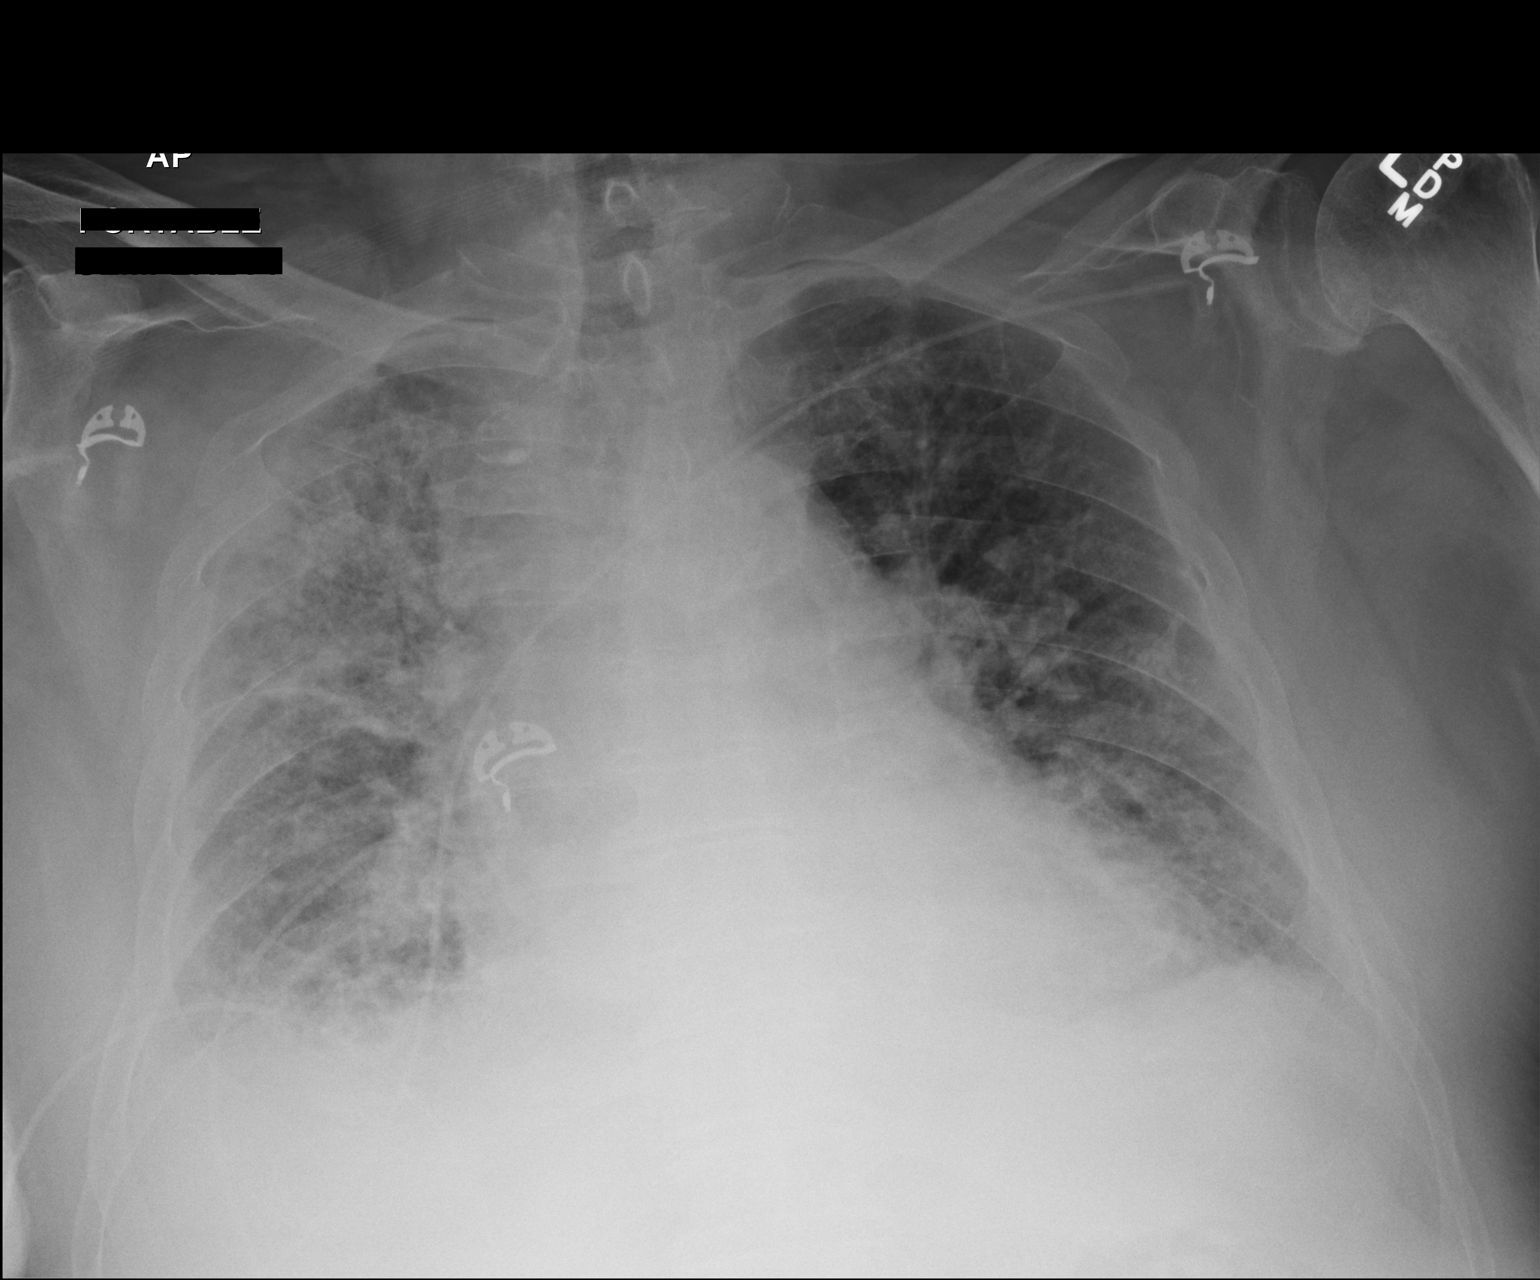

[2 of 2 positions shown; findings below may reference images not displayed]

FINDINGS: Coarse reticular and patchy airspace opacities are
stable, noted throughout the right lung and in the left perihilar
and lower lung.  Again, this may be due to multifocal pneumonia or
asymmetric edema superimposed on chronic interstitial thickening.

There is no pneumothorax.
IMPRESSION: Stable appearance from the previous day's study.  Persistent coarse
reticular and patchy airspace opacities suggesting multifocal
pneumonia or asymmetric edema superimposed on chronic interstitial
thickening.

## 2014-10-06 ENCOUNTER — Other Ambulatory Visit: Payer: Self-pay

## 2015-03-09 IMAGING — CR DG CHEST 2V
2 series · 2 of 2 positions shown · non-contrast
Comparison: 06/09/2013

CLINICAL DATA: Short of breath.  Cough.

EXAM:
CHEST  2 VIEW

[w chest lat]
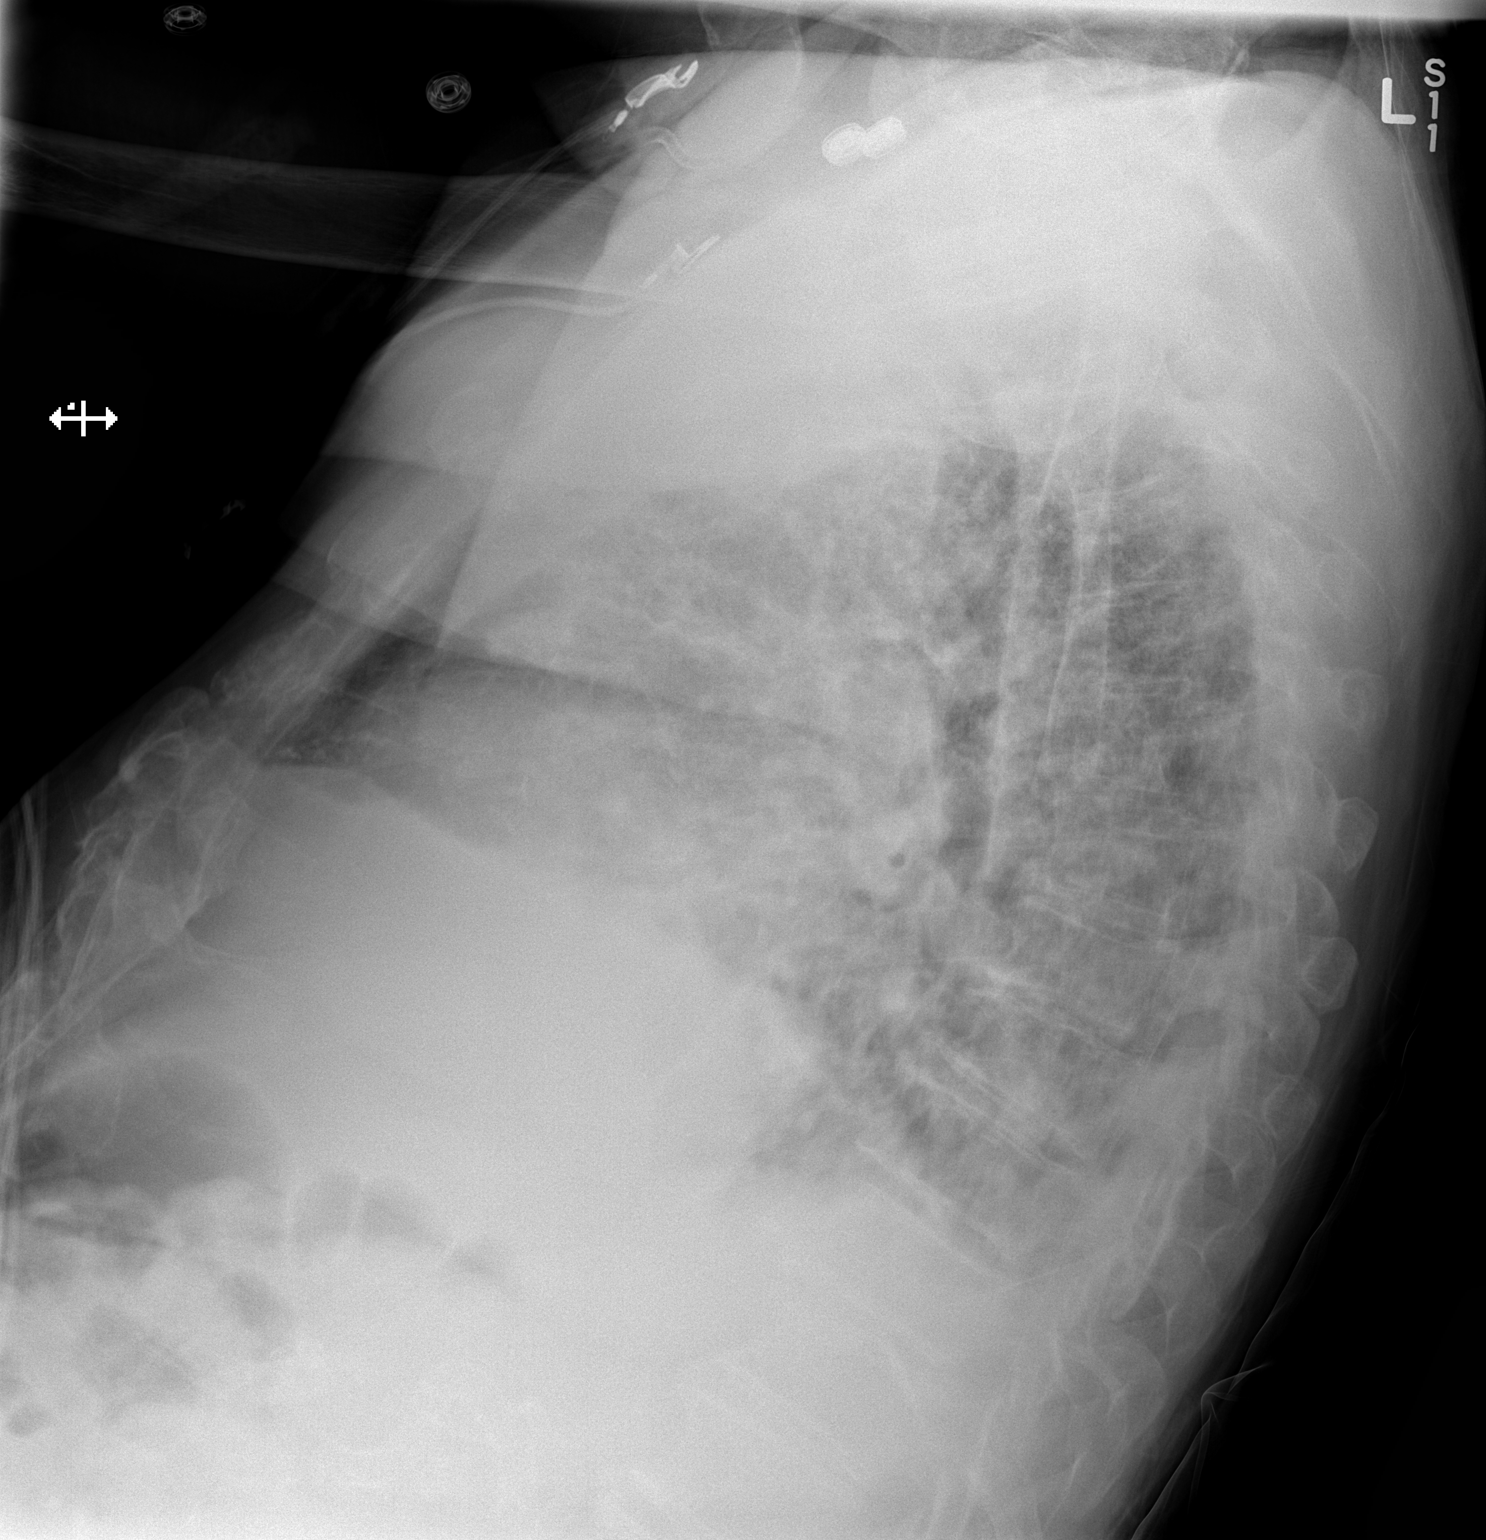

[x chest ap]
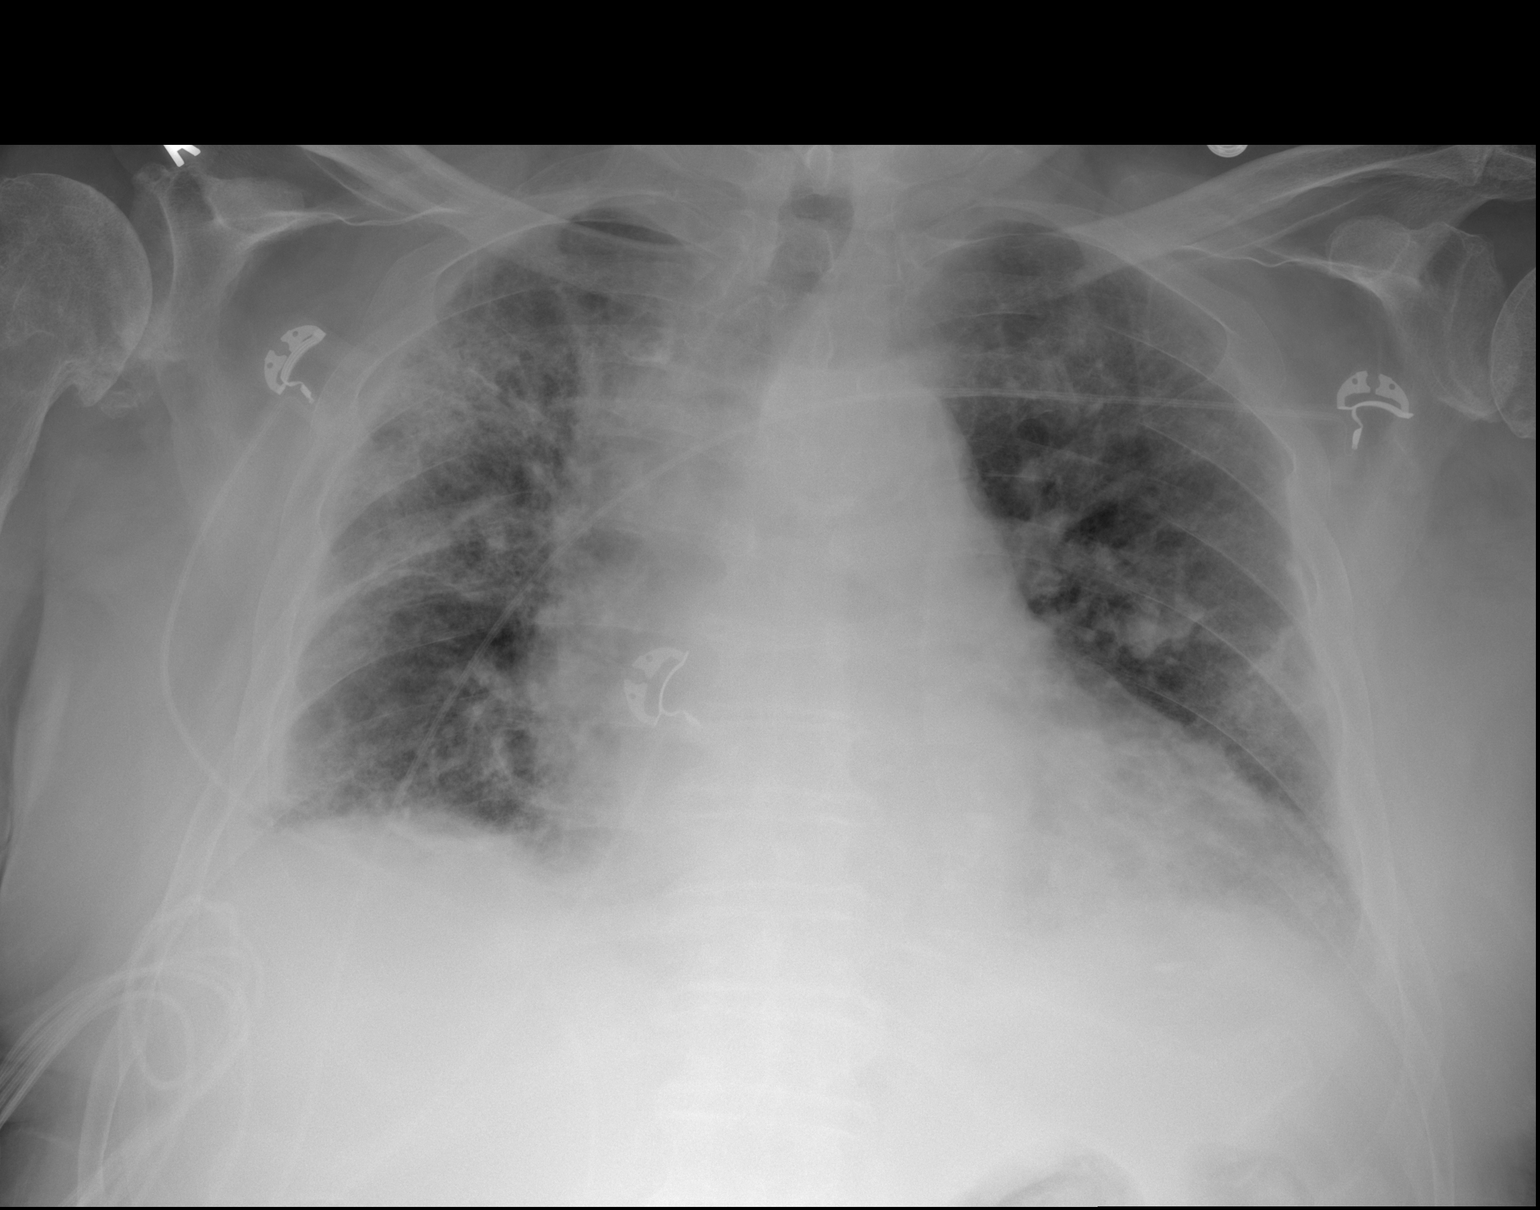

[2 of 2 positions shown; findings below may reference images not displayed]

FINDINGS: Coarse interstitial and airspace opacities in the lungs are stable.
Lung volumes are relatively low. Cardiac silhouette is mildly
enlarged. No pneumothorax or convincing effusion.
IMPRESSION: No change from the most recent prior study. Persistent bilateral
coarse interstitial and airspace opacities suggests multifocal
pneumonia.
